# Patient Record
Sex: Male | Born: 1935 | Race: Black or African American | Hispanic: No | Marital: Married | Smoking: Never smoker
Health system: Southern US, Community
[De-identification: ages and names within clinical notes are randomized; demographics above are authoritative.]

## PROBLEM LIST (undated history)

## (undated) DIAGNOSIS — Z8719 Personal history of other diseases of the digestive system: Secondary | ICD-10-CM

## (undated) DIAGNOSIS — I10 Essential (primary) hypertension: Secondary | ICD-10-CM

## (undated) DIAGNOSIS — K279 Peptic ulcer, site unspecified, unspecified as acute or chronic, without hemorrhage or perforation: Secondary | ICD-10-CM

## (undated) DIAGNOSIS — I059 Rheumatic mitral valve disease, unspecified: Secondary | ICD-10-CM

## (undated) DIAGNOSIS — M1990 Unspecified osteoarthritis, unspecified site: Secondary | ICD-10-CM

## (undated) DIAGNOSIS — Z87898 Personal history of other specified conditions: Secondary | ICD-10-CM

## (undated) DIAGNOSIS — K449 Diaphragmatic hernia without obstruction or gangrene: Secondary | ICD-10-CM

## (undated) DIAGNOSIS — E785 Hyperlipidemia, unspecified: Secondary | ICD-10-CM

## (undated) DIAGNOSIS — K222 Esophageal obstruction: Secondary | ICD-10-CM

## (undated) DIAGNOSIS — F0390 Unspecified dementia without behavioral disturbance: Secondary | ICD-10-CM

## (undated) DIAGNOSIS — I080 Rheumatic disorders of both mitral and aortic valves: Secondary | ICD-10-CM

## (undated) HISTORY — DX: Unspecified osteoarthritis, unspecified site: M19.90

## (undated) HISTORY — DX: Diaphragmatic hernia without obstruction or gangrene: K44.9

## (undated) HISTORY — PX: HEMORRHOID SURGERY: SHX153

## (undated) HISTORY — DX: Rheumatic mitral valve disease, unspecified: I05.9

## (undated) HISTORY — DX: Rheumatic disorders of both mitral and aortic valves: I08.0

## (undated) HISTORY — DX: Essential (primary) hypertension: I10

## (undated) HISTORY — DX: Hyperlipidemia, unspecified: E78.5

## (undated) HISTORY — DX: Peptic ulcer, site unspecified, unspecified as acute or chronic, without hemorrhage or perforation: K27.9

## (undated) HISTORY — PX: CIRCUMCISION: SUR203

## (undated) HISTORY — DX: Personal history of other specified conditions: Z87.898

## (undated) HISTORY — DX: Personal history of other diseases of the digestive system: Z87.19

## (undated) HISTORY — DX: Esophageal obstruction: K22.2

---

## 1989-11-09 ENCOUNTER — Encounter: Payer: Self-pay | Admitting: Gastroenterology

## 2001-06-22 ENCOUNTER — Encounter: Admission: RE | Admit: 2001-06-22 | Discharge: 2001-06-22 | Payer: Self-pay | Admitting: Urology

## 2001-06-22 ENCOUNTER — Encounter: Payer: Self-pay | Admitting: Urology

## 2001-06-23 ENCOUNTER — Encounter (INDEPENDENT_AMBULATORY_CARE_PROVIDER_SITE_OTHER): Payer: Self-pay | Admitting: Specialist

## 2001-06-23 ENCOUNTER — Ambulatory Visit (HOSPITAL_BASED_OUTPATIENT_CLINIC_OR_DEPARTMENT_OTHER): Admission: RE | Admit: 2001-06-23 | Discharge: 2001-06-23 | Payer: Self-pay | Admitting: Urology

## 2004-05-20 ENCOUNTER — Ambulatory Visit (HOSPITAL_COMMUNITY): Admission: RE | Admit: 2004-05-20 | Discharge: 2004-05-20 | Payer: Self-pay | Admitting: Urology

## 2004-05-28 ENCOUNTER — Ambulatory Visit: Payer: Self-pay | Admitting: *Deleted

## 2004-05-29 ENCOUNTER — Ambulatory Visit: Payer: Self-pay | Admitting: *Deleted

## 2004-06-09 ENCOUNTER — Ambulatory Visit: Admission: RE | Admit: 2004-06-09 | Discharge: 2004-09-07 | Payer: Self-pay | Admitting: Radiation Oncology

## 2004-06-11 ENCOUNTER — Ambulatory Visit: Payer: Self-pay

## 2004-10-15 ENCOUNTER — Ambulatory Visit: Admission: RE | Admit: 2004-10-15 | Discharge: 2004-10-15 | Payer: Self-pay | Admitting: Radiation Oncology

## 2005-03-17 ENCOUNTER — Ambulatory Visit: Payer: Self-pay | Admitting: *Deleted

## 2005-03-31 ENCOUNTER — Ambulatory Visit: Payer: Self-pay

## 2005-09-02 ENCOUNTER — Ambulatory Visit: Payer: Self-pay | Admitting: *Deleted

## 2005-09-07 ENCOUNTER — Ambulatory Visit: Payer: Self-pay | Admitting: *Deleted

## 2005-10-01 ENCOUNTER — Ambulatory Visit: Payer: Self-pay

## 2005-10-01 ENCOUNTER — Encounter: Payer: Self-pay | Admitting: Cardiology

## 2005-11-04 ENCOUNTER — Ambulatory Visit: Payer: Self-pay | Admitting: *Deleted

## 2005-12-02 ENCOUNTER — Ambulatory Visit: Payer: Self-pay | Admitting: Cardiology

## 2005-12-10 ENCOUNTER — Ambulatory Visit: Payer: Self-pay

## 2005-12-10 ENCOUNTER — Ambulatory Visit: Payer: Self-pay | Admitting: *Deleted

## 2005-12-29 ENCOUNTER — Ambulatory Visit: Payer: Self-pay

## 2005-12-29 ENCOUNTER — Encounter: Payer: Self-pay | Admitting: Cardiology

## 2006-02-02 ENCOUNTER — Ambulatory Visit: Payer: Self-pay | Admitting: *Deleted

## 2006-06-15 ENCOUNTER — Ambulatory Visit: Payer: Self-pay | Admitting: *Deleted

## 2006-06-18 ENCOUNTER — Encounter: Payer: Self-pay | Admitting: Cardiology

## 2006-06-18 ENCOUNTER — Ambulatory Visit: Payer: Self-pay

## 2006-12-30 ENCOUNTER — Ambulatory Visit: Payer: Self-pay | Admitting: *Deleted

## 2007-01-13 ENCOUNTER — Encounter (INDEPENDENT_AMBULATORY_CARE_PROVIDER_SITE_OTHER): Payer: Self-pay | Admitting: *Deleted

## 2007-01-13 ENCOUNTER — Ambulatory Visit: Payer: Self-pay

## 2007-01-13 ENCOUNTER — Ambulatory Visit: Payer: Self-pay | Admitting: Cardiology

## 2007-03-23 ENCOUNTER — Ambulatory Visit: Payer: Self-pay | Admitting: Cardiovascular Disease

## 2007-04-11 ENCOUNTER — Inpatient Hospital Stay (HOSPITAL_COMMUNITY): Admission: EM | Admit: 2007-04-11 | Discharge: 2007-04-13 | Payer: Self-pay | Admitting: *Deleted

## 2007-04-12 ENCOUNTER — Encounter: Payer: Self-pay | Admitting: Internal Medicine

## 2007-04-12 DIAGNOSIS — K279 Peptic ulcer, site unspecified, unspecified as acute or chronic, without hemorrhage or perforation: Secondary | ICD-10-CM | POA: Insufficient documentation

## 2007-04-12 DIAGNOSIS — K449 Diaphragmatic hernia without obstruction or gangrene: Secondary | ICD-10-CM | POA: Insufficient documentation

## 2007-04-13 ENCOUNTER — Ambulatory Visit: Payer: Self-pay | Admitting: Internal Medicine

## 2007-05-11 ENCOUNTER — Ambulatory Visit: Payer: Self-pay | Admitting: Internal Medicine

## 2007-05-11 LAB — CONVERTED CEMR LAB
Basophils Absolute: 0 10*3/uL (ref 0.0–0.1)
Eosinophils Relative: 2.8 % (ref 0.0–5.0)
Hemoglobin: 10.5 g/dL — ABNORMAL LOW (ref 13.0–17.0)
MCV: 86.9 fL (ref 78.0–100.0)
Monocytes Absolute: 0.4 10*3/uL (ref 0.2–0.7)
Platelets: 206 10*3/uL (ref 150–400)
RBC: 3.59 M/uL — ABNORMAL LOW (ref 4.22–5.81)
WBC: 6.6 10*3/uL (ref 4.5–10.5)

## 2007-06-15 ENCOUNTER — Encounter: Payer: Self-pay | Admitting: Internal Medicine

## 2007-06-21 ENCOUNTER — Encounter: Payer: Self-pay | Admitting: Internal Medicine

## 2007-06-21 DIAGNOSIS — Z87898 Personal history of other specified conditions: Secondary | ICD-10-CM

## 2007-06-21 DIAGNOSIS — E785 Hyperlipidemia, unspecified: Secondary | ICD-10-CM | POA: Insufficient documentation

## 2007-06-21 DIAGNOSIS — I059 Rheumatic mitral valve disease, unspecified: Secondary | ICD-10-CM | POA: Insufficient documentation

## 2007-06-21 DIAGNOSIS — I10 Essential (primary) hypertension: Secondary | ICD-10-CM | POA: Insufficient documentation

## 2007-06-21 DIAGNOSIS — Z8719 Personal history of other diseases of the digestive system: Secondary | ICD-10-CM | POA: Insufficient documentation

## 2007-06-23 ENCOUNTER — Ambulatory Visit: Payer: Self-pay | Admitting: Cardiovascular Disease

## 2007-06-23 LAB — CONVERTED CEMR LAB
Total Bilirubin: 1.5 mg/dL — ABNORMAL HIGH (ref 0.3–1.2)
Total Protein: 6.5 g/dL (ref 6.0–8.3)
VLDL: 9 mg/dL (ref 0–40)

## 2007-09-16 DIAGNOSIS — K222 Esophageal obstruction: Secondary | ICD-10-CM | POA: Insufficient documentation

## 2007-09-30 ENCOUNTER — Ambulatory Visit: Payer: Self-pay

## 2007-09-30 LAB — CONVERTED CEMR LAB
BUN: 14 mg/dL (ref 6–23)
Creatinine, Ser: 1.2 mg/dL (ref 0.4–1.5)
Sodium: 146 meq/L — ABNORMAL HIGH (ref 135–145)

## 2007-10-07 ENCOUNTER — Ambulatory Visit: Payer: Self-pay | Admitting: Cardiovascular Disease

## 2007-10-10 ENCOUNTER — Emergency Department (HOSPITAL_COMMUNITY): Admission: EM | Admit: 2007-10-10 | Discharge: 2007-10-10 | Payer: Self-pay | Admitting: Emergency Medicine

## 2008-07-31 ENCOUNTER — Ambulatory Visit: Payer: Self-pay | Admitting: Cardiovascular Disease

## 2008-07-31 ENCOUNTER — Ambulatory Visit: Payer: Self-pay

## 2008-07-31 ENCOUNTER — Encounter: Payer: Self-pay | Admitting: Cardiovascular Disease

## 2008-08-01 ENCOUNTER — Ambulatory Visit: Payer: Self-pay | Admitting: Cardiovascular Disease

## 2008-08-01 LAB — CONVERTED CEMR LAB
ALT: 18 units/L (ref 0–53)
AST: 21 units/L (ref 0–37)
BUN: 16 mg/dL (ref 6–23)
Creatinine, Ser: 1 mg/dL (ref 0.4–1.5)
GFR calc Af Amer: 93 mL/min
GFR calc non Af Amer: 77 mL/min
HDL: 56.2 mg/dL (ref >39.0)
LDL Cholesterol: 86 mg/dL (ref 0–99)
Potassium: 4.1 meq/L (ref 3.5–5.1)
Sodium: 143 meq/L (ref 135–145)
Total Bilirubin: 1.4 mg/dL — ABNORMAL HIGH (ref 0.3–1.2)
Total Protein: 6.6 g/dL (ref 6.0–8.3)
Triglycerides: 75 mg/dL (ref 0–149)
VLDL: 15 mg/dL (ref 0–40)

## 2008-12-25 ENCOUNTER — Emergency Department (HOSPITAL_COMMUNITY): Admission: EM | Admit: 2008-12-25 | Discharge: 2008-12-25 | Payer: Self-pay | Admitting: Emergency Medicine

## 2009-01-17 ENCOUNTER — Telehealth: Payer: Self-pay | Admitting: Internal Medicine

## 2009-01-29 DIAGNOSIS — M1990 Unspecified osteoarthritis, unspecified site: Secondary | ICD-10-CM | POA: Insufficient documentation

## 2009-01-29 DIAGNOSIS — I080 Rheumatic disorders of both mitral and aortic valves: Secondary | ICD-10-CM | POA: Insufficient documentation

## 2009-02-04 ENCOUNTER — Ambulatory Visit: Payer: Self-pay | Admitting: Cardiovascular Disease

## 2009-04-01 ENCOUNTER — Ambulatory Visit: Payer: Self-pay | Admitting: Cardiology

## 2009-04-01 ENCOUNTER — Inpatient Hospital Stay (HOSPITAL_COMMUNITY): Admission: EM | Admit: 2009-04-01 | Discharge: 2009-04-05 | Payer: Self-pay | Admitting: Emergency Medicine

## 2009-04-02 ENCOUNTER — Encounter: Payer: Self-pay | Admitting: Cardiovascular Disease

## 2009-04-03 ENCOUNTER — Encounter: Payer: Self-pay | Admitting: Cardiology

## 2009-04-08 ENCOUNTER — Encounter (INDEPENDENT_AMBULATORY_CARE_PROVIDER_SITE_OTHER): Payer: Self-pay

## 2009-04-08 ENCOUNTER — Ambulatory Visit: Payer: Self-pay | Admitting: Cardiovascular Disease

## 2009-04-08 LAB — CONVERTED CEMR LAB: Prothrombin Time: 11.7 s (ref 9.1–11.7)

## 2009-04-09 ENCOUNTER — Inpatient Hospital Stay (HOSPITAL_BASED_OUTPATIENT_CLINIC_OR_DEPARTMENT_OTHER): Admission: RE | Admit: 2009-04-09 | Discharge: 2009-04-09 | Payer: Self-pay | Admitting: Cardiovascular Disease

## 2009-04-09 ENCOUNTER — Ambulatory Visit: Payer: Self-pay | Admitting: Cardiovascular Disease

## 2009-04-19 ENCOUNTER — Ambulatory Visit: Payer: Self-pay | Admitting: Thoracic Surgery (Cardiothoracic Vascular Surgery)

## 2009-04-19 ENCOUNTER — Encounter: Payer: Self-pay | Admitting: Internal Medicine

## 2009-04-23 ENCOUNTER — Encounter
Admission: RE | Admit: 2009-04-23 | Discharge: 2009-04-23 | Payer: Self-pay | Admitting: Thoracic Surgery (Cardiothoracic Vascular Surgery)

## 2009-05-03 ENCOUNTER — Encounter: Payer: Self-pay | Admitting: Thoracic Surgery (Cardiothoracic Vascular Surgery)

## 2009-05-03 ENCOUNTER — Ambulatory Visit (HOSPITAL_COMMUNITY)
Admission: RE | Admit: 2009-05-03 | Discharge: 2009-05-03 | Payer: Self-pay | Admitting: Thoracic Surgery (Cardiothoracic Vascular Surgery)

## 2009-05-06 ENCOUNTER — Ambulatory Visit: Payer: Self-pay | Admitting: Thoracic Surgery (Cardiothoracic Vascular Surgery)

## 2009-05-07 ENCOUNTER — Ambulatory Visit: Payer: Self-pay | Admitting: Thoracic Surgery (Cardiothoracic Vascular Surgery)

## 2009-05-07 ENCOUNTER — Inpatient Hospital Stay (HOSPITAL_COMMUNITY)
Admission: RE | Admit: 2009-05-07 | Discharge: 2009-05-14 | Payer: Self-pay | Admitting: Thoracic Surgery (Cardiothoracic Vascular Surgery)

## 2009-05-07 HISTORY — PX: MITRAL VALVE REPAIR: SHX2039

## 2009-06-03 ENCOUNTER — Encounter: Payer: Self-pay | Admitting: Cardiovascular Disease

## 2009-06-03 ENCOUNTER — Ambulatory Visit: Payer: Self-pay | Admitting: Thoracic Surgery (Cardiothoracic Vascular Surgery)

## 2009-06-03 ENCOUNTER — Encounter
Admission: RE | Admit: 2009-06-03 | Discharge: 2009-06-03 | Payer: Self-pay | Admitting: Thoracic Surgery (Cardiothoracic Vascular Surgery)

## 2009-06-11 ENCOUNTER — Encounter: Payer: Self-pay | Admitting: Cardiovascular Disease

## 2009-06-18 ENCOUNTER — Encounter: Payer: Self-pay | Admitting: Cardiovascular Disease

## 2009-06-26 ENCOUNTER — Encounter (INDEPENDENT_AMBULATORY_CARE_PROVIDER_SITE_OTHER): Payer: Self-pay | Admitting: *Deleted

## 2009-07-01 ENCOUNTER — Ambulatory Visit: Payer: Self-pay | Admitting: Cardiovascular Disease

## 2009-07-29 ENCOUNTER — Encounter: Payer: Self-pay | Admitting: Cardiovascular Disease

## 2009-07-29 ENCOUNTER — Ambulatory Visit: Payer: Self-pay | Admitting: Thoracic Surgery (Cardiothoracic Vascular Surgery)

## 2009-08-13 ENCOUNTER — Encounter: Payer: Self-pay | Admitting: Cardiovascular Disease

## 2009-08-13 ENCOUNTER — Ambulatory Visit (HOSPITAL_COMMUNITY): Admission: RE | Admit: 2009-08-13 | Discharge: 2009-08-13 | Payer: Self-pay | Admitting: Cardiovascular Disease

## 2009-08-13 ENCOUNTER — Ambulatory Visit: Payer: Self-pay

## 2009-08-13 ENCOUNTER — Ambulatory Visit: Payer: Self-pay | Admitting: Cardiovascular Disease

## 2009-09-09 ENCOUNTER — Telehealth (INDEPENDENT_AMBULATORY_CARE_PROVIDER_SITE_OTHER): Payer: Self-pay | Admitting: *Deleted

## 2009-10-18 ENCOUNTER — Ambulatory Visit: Payer: Self-pay | Admitting: Cardiovascular Disease

## 2009-11-04 ENCOUNTER — Ambulatory Visit: Payer: Self-pay | Admitting: Cardiovascular Disease

## 2009-11-06 LAB — CONVERTED CEMR LAB
CO2: 32 meq/L (ref 19–32)
Chloride: 107 meq/L (ref 96–112)
Cholesterol: 174 mg/dL (ref 0–200)
Creatinine, Ser: 1.1 mg/dL (ref 0.4–1.5)
GFR calc non Af Amer: 82.68 mL/min (ref >60)
Glucose, Bld: 89 mg/dL (ref 70–99)
HDL: 67.5 mg/dL (ref >39.00)
LDL Cholesterol: 83 mg/dL (ref 0–99)
Sodium: 145 meq/L (ref 135–145)
Total CHOL/HDL Ratio: 3
Total Protein: 6.6 g/dL (ref 6.0–8.3)

## 2010-01-14 IMAGING — CR DG CHEST 1V PORT
1 series · 1 of 1 positions shown · non-contrast
Comparison: 04/01/2009

CLINICAL DATA: CHF

PORTABLE CHEST - 1 VIEW

[view not recorded]
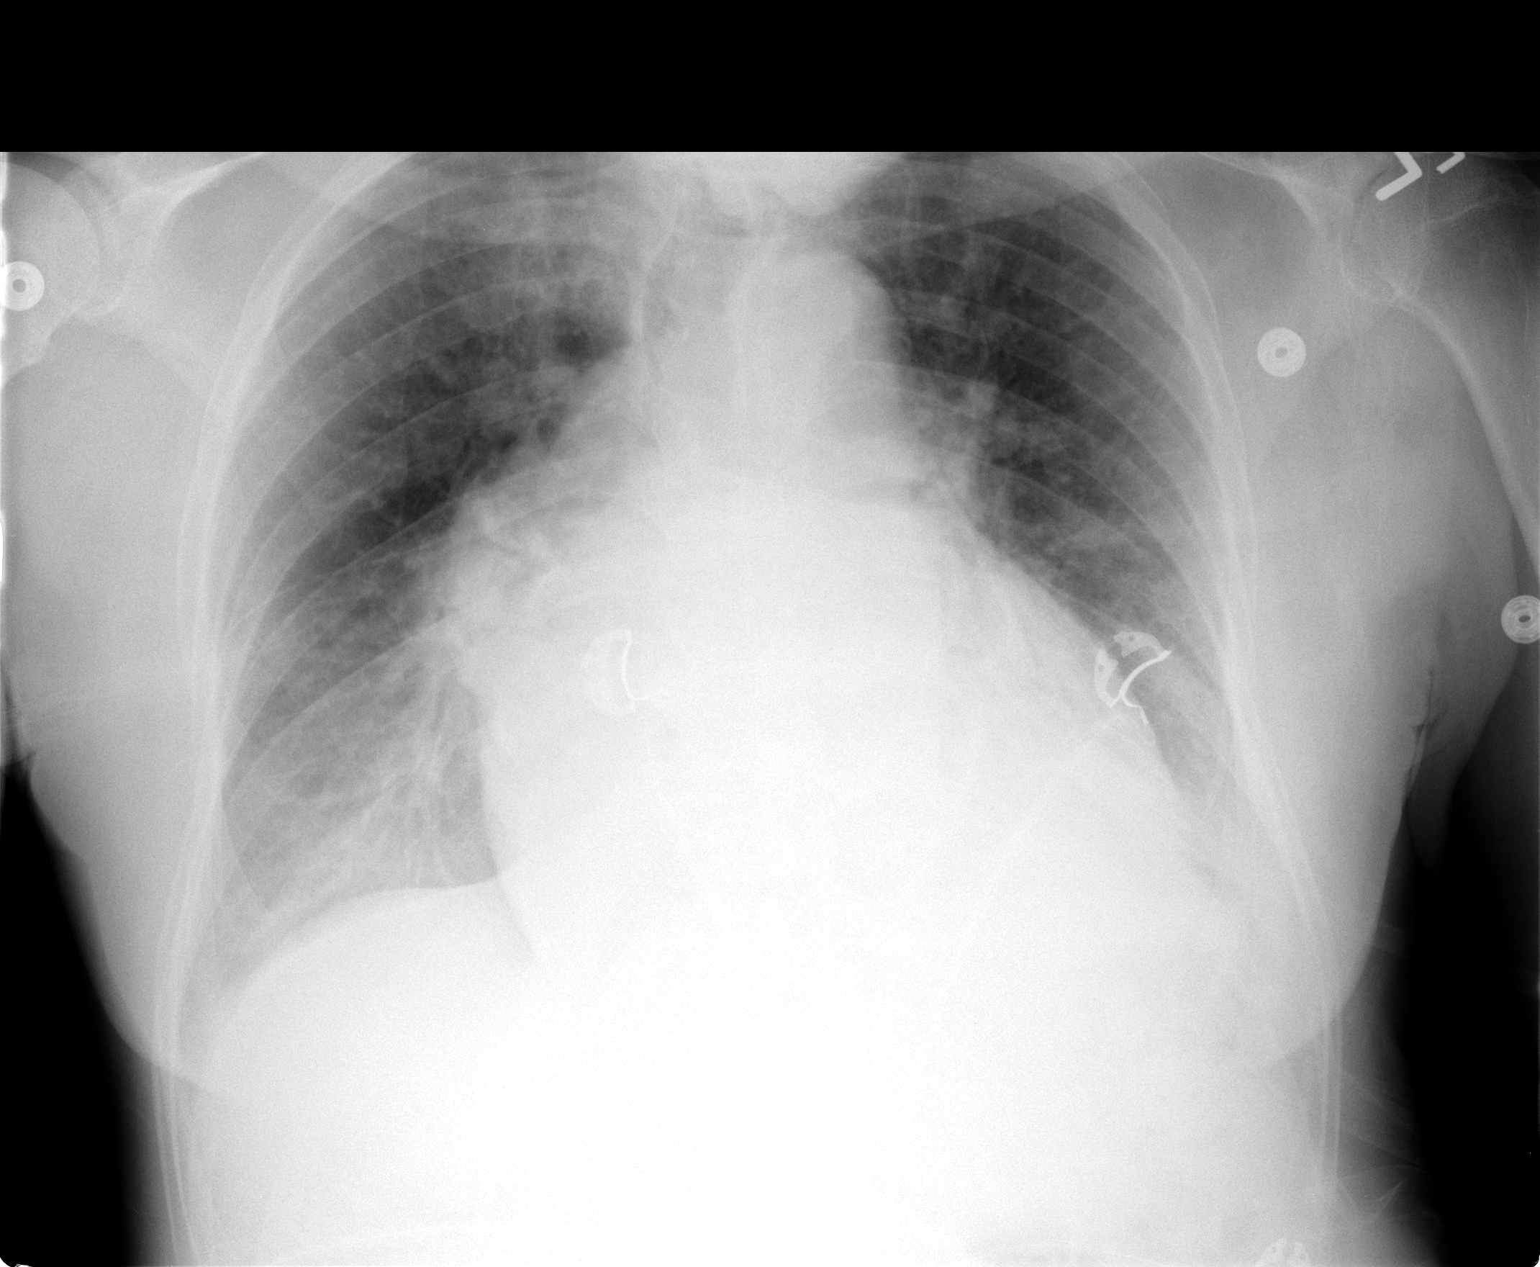

[1 of 1 positions shown; findings below may reference images not displayed]

FINDINGS: Slight increase in degree of pulmonary vascular
congestion.  Cardiomegaly.  Probable  minimal left pleural
effusion. Possibly minimal interstitial edema.
IMPRESSION: Increase in pulmonary vascular congestion.

## 2010-02-04 ENCOUNTER — Encounter: Payer: Self-pay | Admitting: Cardiovascular Disease

## 2010-02-18 IMAGING — CR DG CHEST 1V PORT
1 series · 1 of 1 positions shown · non-contrast
Comparison: 05/03/2009

CLINICAL DATA: Mitral regurgitation.  Postop.

PORTABLE CHEST - 1 VIEW

[AP]
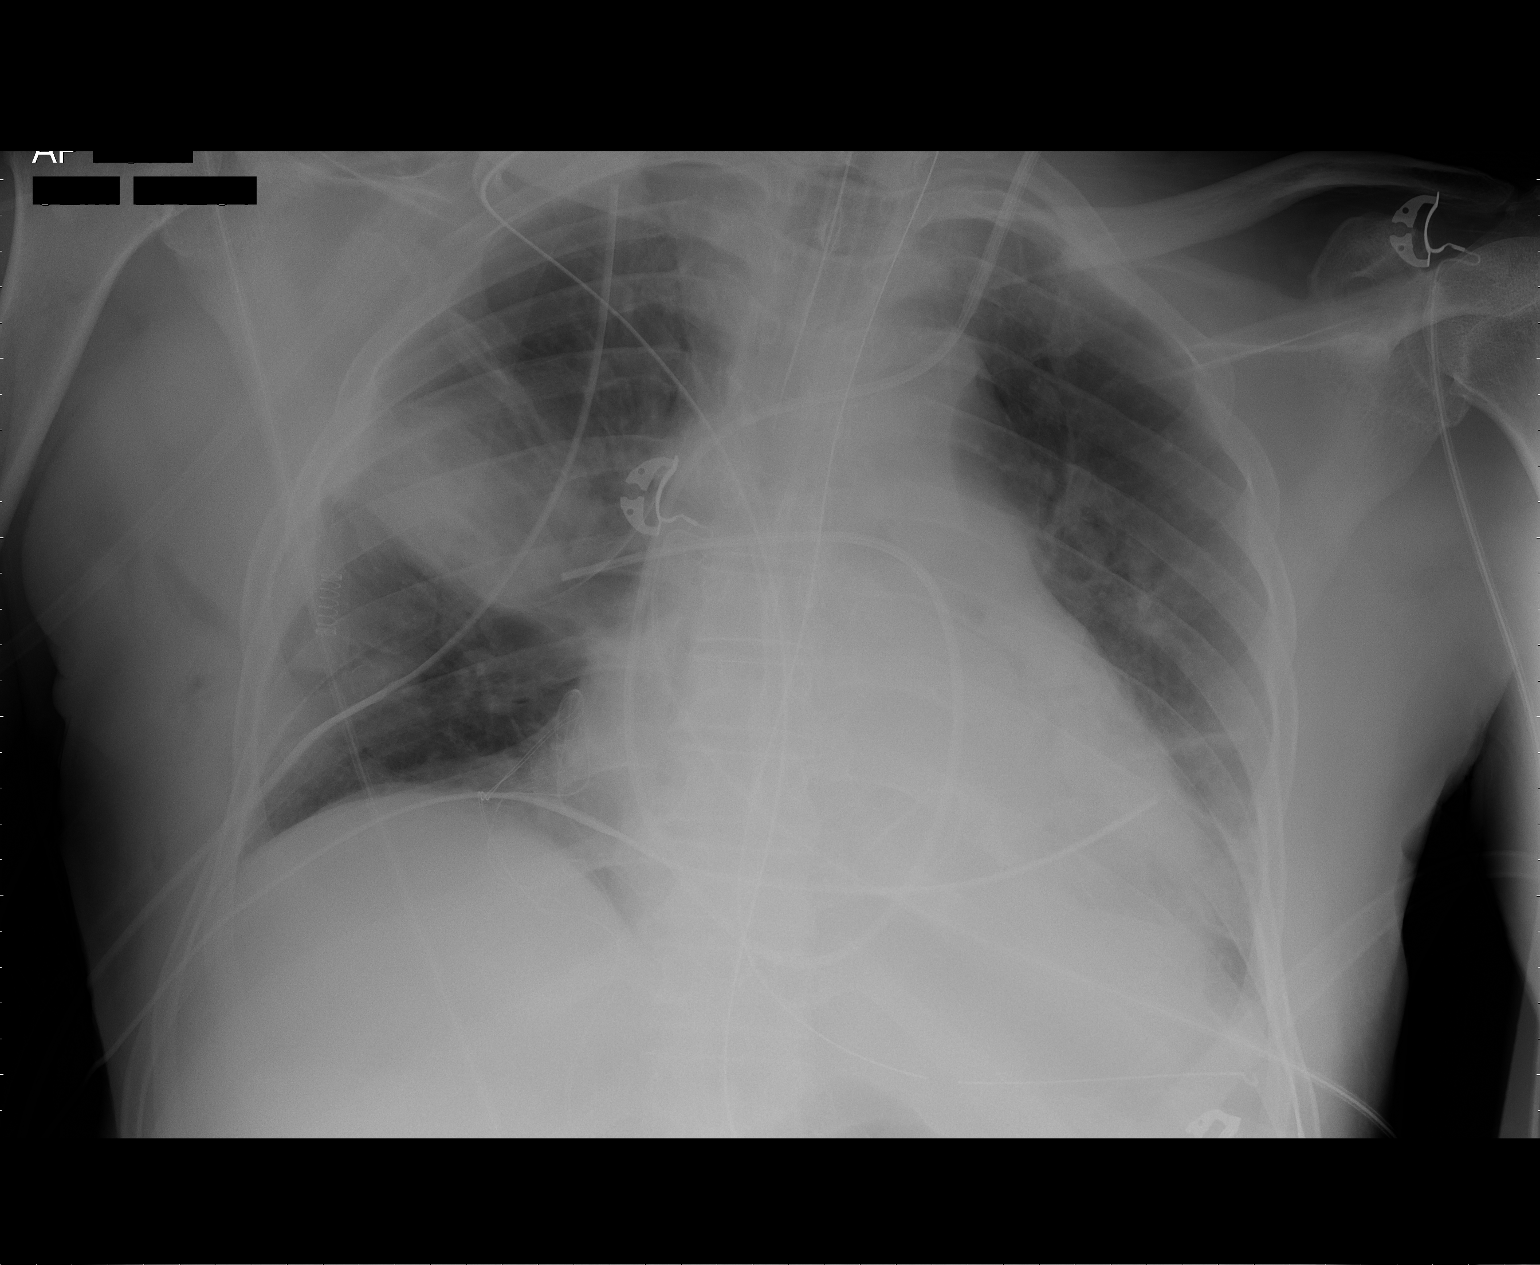

[1 of 1 positions shown; findings below may reference images not displayed]

FINDINGS: Intubation.  Endotracheal tube terminates 2.2 cm above
the carina.  Nasogastric tube terminates at the body of the
stomach.  A left IJ Swan-Ganz catheter terminates at the central
versus lobar branch of the right pulmonary artery. Interval mitral
valve repair. A right-sided  and a probable left-sided chest tube.
Cardiomegaly accentuated by AP portable technique.  No definite
pleural effusion. No pneumothorax.  Low lung volumes.  Patchy right
midlung and left lung base air space disease. Probable subcutaneous
air about the right chest, minimal.
IMPRESSION: 1.  Interval mitral valve repair with patchy bilateral atelectasis.
2. No pneumothorax.
3.  Left IJ Swan-Ganz catheter, with tip borderline peripheral in
position.  Consider retraction 2 cm.

## 2010-02-20 IMAGING — CR DG CHEST 1V PORT
1 series · 1 of 1 positions shown · non-contrast
Comparison: 05/08/2009

CLINICAL DATA: CABG.

PORTABLE CHEST - 1 VIEW

[AP]
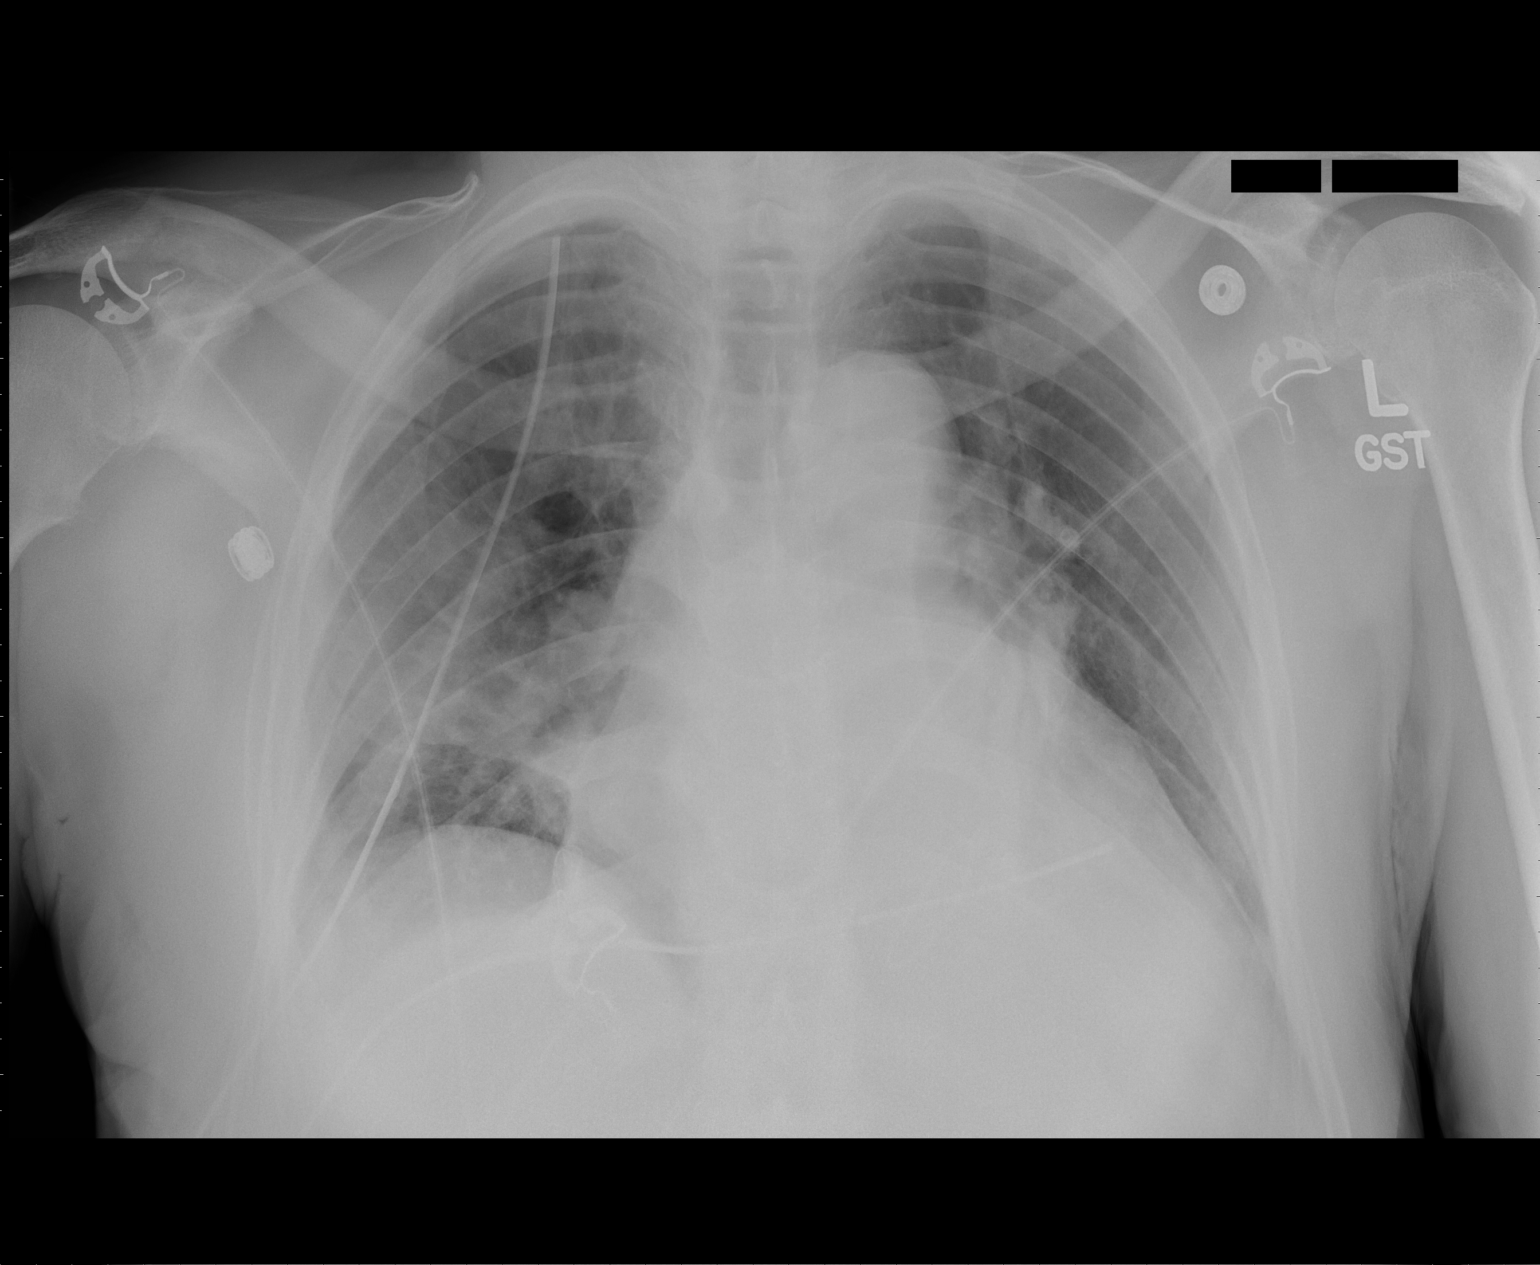

[1 of 1 positions shown; findings below may reference images not displayed]

FINDINGS: Interval removal of left IJ Swan-Ganz catheter.  2 chest
tubes are unchanged in position.

Moderate cardiomegaly.  Probable small left pleural effusion. No
pneumothorax.  No change in mild interstitial edema and patchy
bilateral atelectasis.
IMPRESSION: 1.  Removal of left IJ Swan-Ganz catheter.
2.  Otherwise, similar interstitial edema and patchy bilateral
atelectasis.

## 2010-02-21 IMAGING — CR DG CHEST 2V
2 series · 2 of 2 positions shown · non-contrast
Comparison: 05/09/2009

CLINICAL DATA: Chest tube removal.

CHEST - 2 VIEW

[w chest pa]
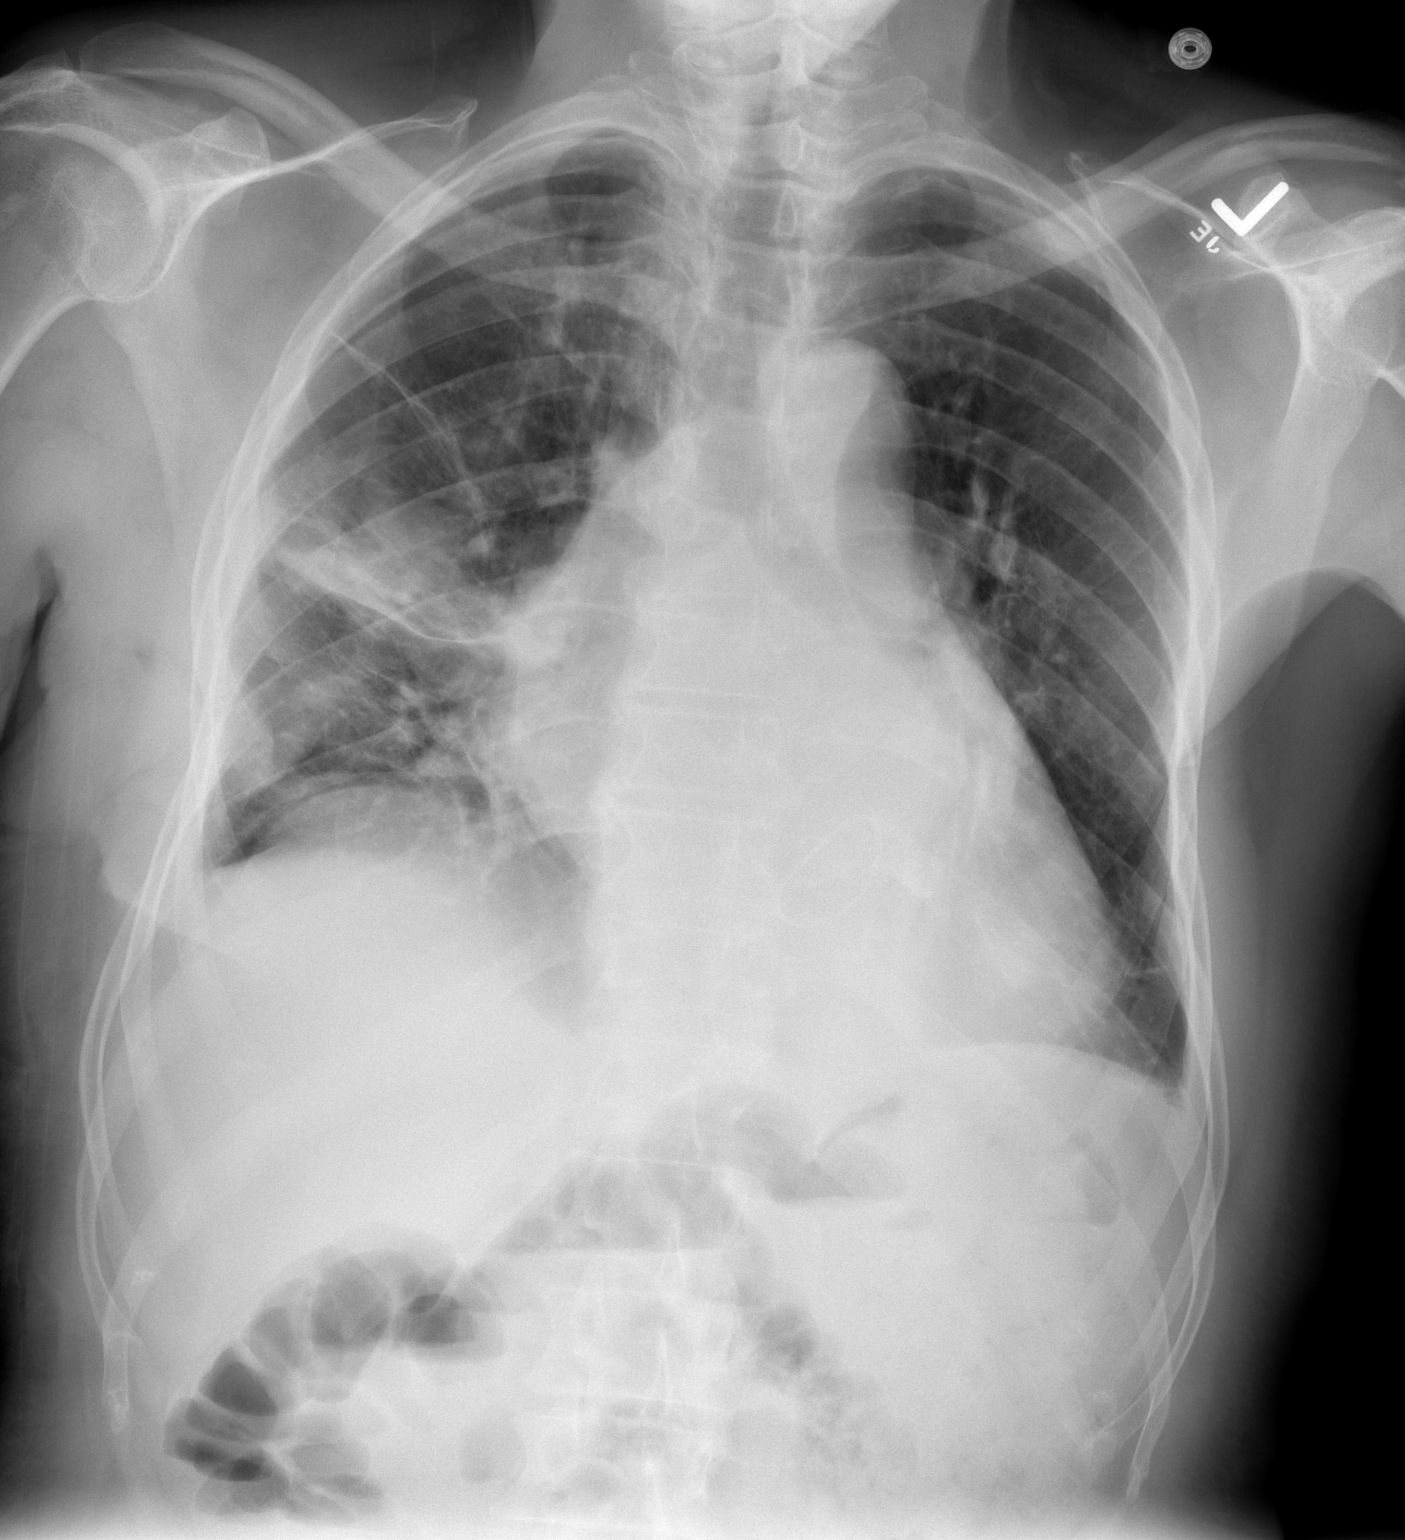

[w chest lat]
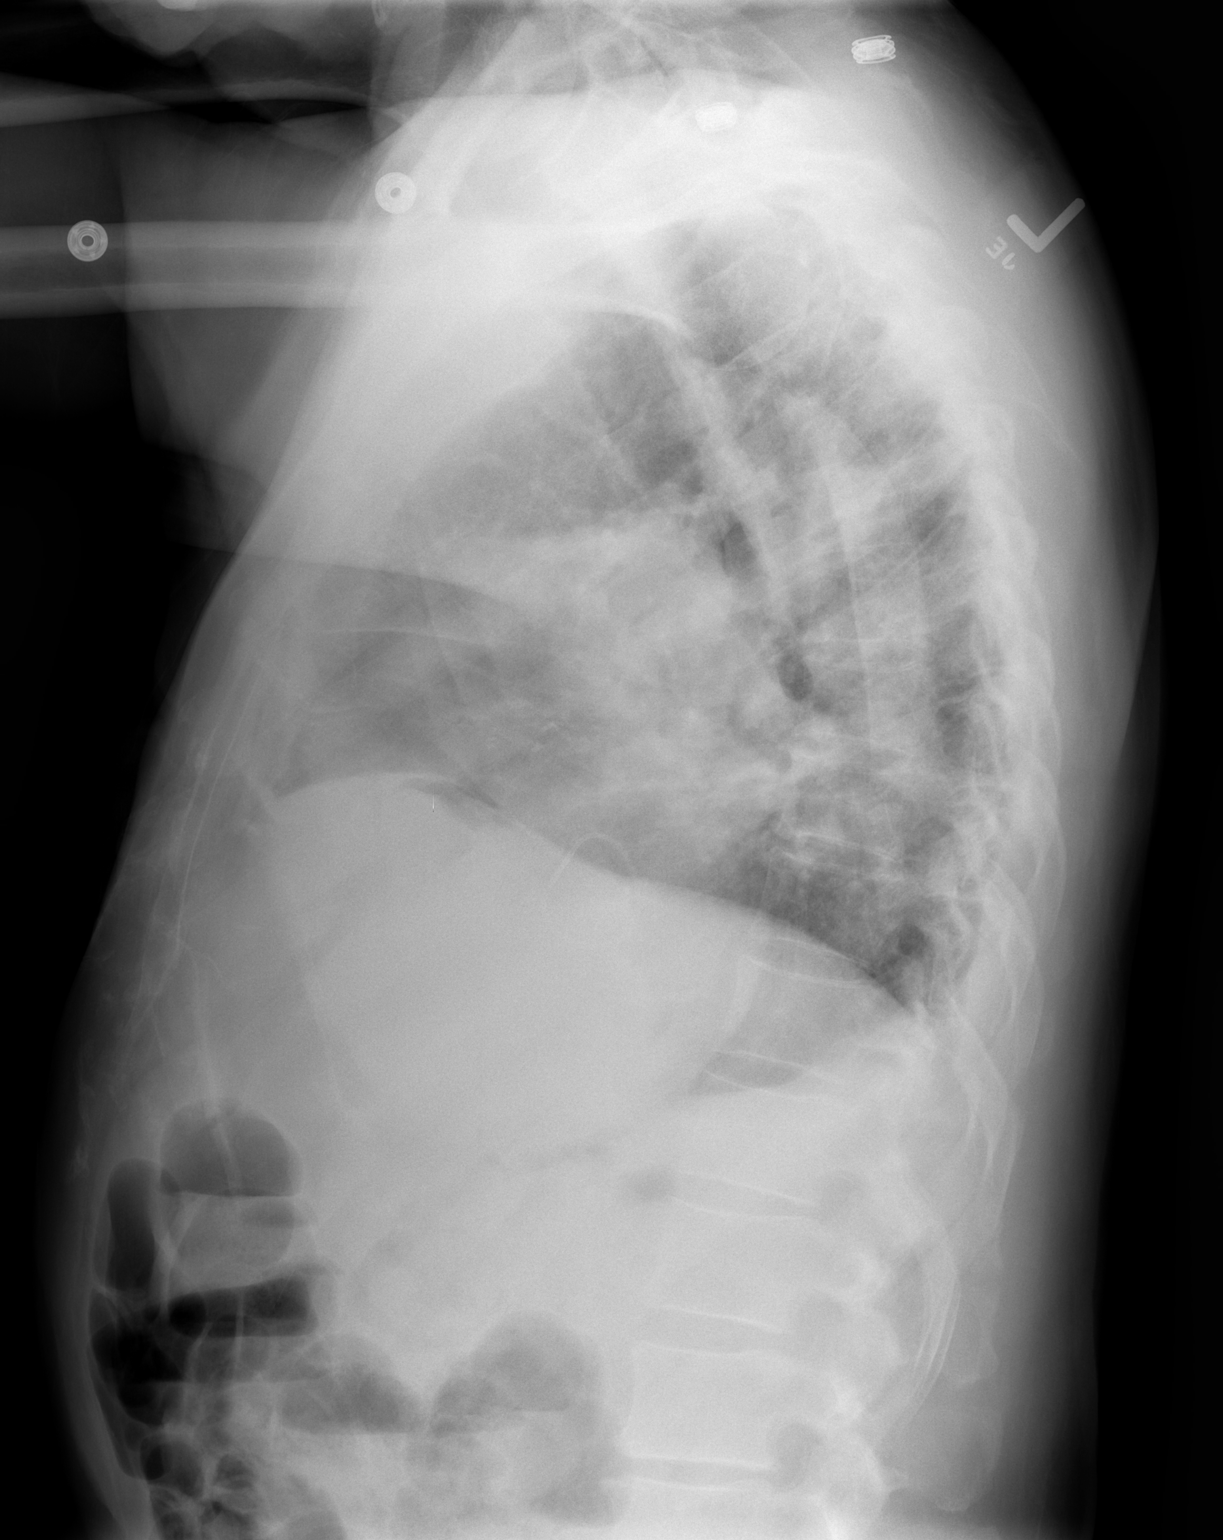

[2 of 2 positions shown; findings below may reference images not displayed]

FINDINGS: Right chest tube has been removed.  No pneumothorax.
Patchy right mid and lower lung airspace disease again noted,
unchanged.  Minimal left base atelectasis.  There is stable
cardiomegaly.  The patient is status post valve replacement.
IMPRESSION: Interval removal of right chest tube without pneumothorax.

Patchy right mid and lower lung airspace disease, stable.

Cardiomegaly.

## 2010-05-15 ENCOUNTER — Ambulatory Visit: Payer: Self-pay | Admitting: Cardiovascular Disease

## 2010-05-28 ENCOUNTER — Ambulatory Visit: Payer: Self-pay | Admitting: Cardiovascular Disease

## 2010-05-29 LAB — CONVERTED CEMR LAB
BUN: 25 mg/dL — ABNORMAL HIGH (ref 6–23)
Creatinine, Ser: 1.2 mg/dL (ref 0.4–1.5)
GFR calc non Af Amer: 78.43 mL/min (ref >60)
Sodium: 141 meq/L (ref 135–145)

## 2010-08-09 ENCOUNTER — Encounter: Payer: Self-pay | Admitting: Urology

## 2010-08-19 NOTE — Letter (Signed)
Summary: [facility] Office Visit  [facility] Office Visit   Imported By: Kassie Mends 08/07/2009 10:55:37  _____________________________________________________________________  External Attachment:    Type:   Image     Comment:   External Document

## 2010-08-19 NOTE — Letter (Signed)
[facility]: [facility]   Imported By: Marylou Mccoy 02/19/2010 17:56:57  _____________________________________________________________________  External Attachment:    Type:   Image     Comment:   External Document

## 2010-08-19 NOTE — Miscellaneous (Signed)
Summary: [facility]  [facility]   Imported By: Roderic Ovens 07/23/2009 16:11:24  _____________________________________________________________________  External Attachment:    Type:   Image     Comment:   External Document

## 2010-08-19 NOTE — Assessment & Plan Note (Signed)
Summary: f66m   Visit Type:  6 months follow up Primary Provider:  Jacques Navy MD  CC:  No complaints.  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 He presents today for followup evaluation.  The patient underwent mitral valve repair in October 2009 from a right mini thoracotomy approach. He has done very well since valve repair. Post op echo was done January 2011 and it demonstrated only trivial MR. LA pressure appeared elevated based on bowing of the interatrial septum.  Mr Dorwart continues to feel well. He denies chest pain, dyspnea, orthopnea, or PND. He has no complaints. He exercises regularly at the [facility] on the treadmill and bicycle.     Current Medications (verified): 1)  Lipitor 80 Mg  Tabs (Atorvastatin Calcium) .Marland Kitchen.. 1 By Mouth Once Daily 2)  Metoprolol Tartrate 25 Mg Tabs (Metoprolol Tartrate) .... Take One Tablet By Mouth Twice A Day 3)  Timoptic 0.5 % Soln (Timolol Maleate) .... As Directed 4)  Amlodipine Besylate 5 Mg Tabs (Amlodipine Besylate) .... Take One Tablet By Mouth Daily 5)  Lisinopril 20 Mg Tabs (Lisinopril) .... Take One Tablet By Mouth Daily 6)  Multivitamins  Tabs (Multiple Vitamin) .... Take 1 Tablet By Mouth Once A Day  Allergies: 1)  ! Jonne Ply  Past History:  Past medical history reviewed for relevance to current acute and chronic problems.  Past Medical History: Reviewed history from 10/18/2009 and no changes required. Current Problems:  MITRAL REGURGITATION (ICD-396.3) s/p MV repair 2009 MITRAL VALVE PROLAPSE (ICD-424.0) HYPERLIPIDEMIA (ICD-272.4) HYPERTENSION (ICD-401.9) PUD (ICD-533.90) ESOPHAGEAL STRICTURE (ICD-530.3) HIATAL HERNIA (ICD-553.3) BENIGN PROSTATIC HYPERTROPHY, HX OF (ICD-V13.8) GASTROINTESTINAL HEMORRHAGE, HX OF (ICD-V12.79) OSTEOARTHRITIS (ICD-715.90)  Review of Systems       Negative except as per  HPI   Vital Signs:  Patient profile:   77 year old male Height:      68 inches Weight:      172 pounds BMI:     26.25 Pulse rate:   55 / minute Pulse rhythm:   irregular Resp:     18 per minute BP sitting:   155 / 73  (left arm) Cuff size:   large  Vitals Entered By: Vikki Ports (May 15, 2010 10:14 AM)  Physical Exam  General:  Pt is alert and oriented, in no acute distress, elderly male. HEENT: normal Neck: normal carotid upstrokes without bruits, JVP normal Lungs: CTA CV: RRR without murmur or gallop Abd: soft, NT, positive BS, no bruit, no organomegaly Ext: no clubbing, cyanosis, or edema. peripheral pulses 2+ and equal Skin: warm and dry without rash    EKG  Procedure date:  05/15/2010  Findings:      Sinus bradycardia with first degree AVB 55 bpm, LVH, T wave abnormality nonspecific  Impression & Recommendations:  Problem # 1:  MITRAL REGURGITATION (ICD-396.3) Pt stable with a normal exam following MV repair.   Problem # 2:  HYPERTENSION (ICD-401.9) Suboptimal control. Recommend add HCTZ 25 mg and followup metabolic panel in 2 -3 weeks.  The following medications were removed from the medication list:    Lisinopril 20 Mg Tabs (Lisinopril) .Marland Kitchen... Take one tablet by mouth daily His updated medication list for this problem includes:    Metoprolol Tartrate 25 Mg Tabs (Metoprolol tartrate) .Marland Kitchen... Take one tablet by mouth twice a day    Amlodipine Besylate 5 Mg Tabs (Amlodipine besylate) .Marland Kitchen... Take one tablet by mouth daily  Lisinopril-hydrochlorothiazide 20-25 Mg Tabs (Lisinopril-hydrochlorothiazide) .Marland Kitchen... Please take one tablet by mouth daily.  BP today: 155/73 Prior BP: 154/82 (10/18/2009)  Labs Reviewed: K+: 3.9 (11/04/2009) Creat: : 1.1 (11/04/2009)   Chol: 174 (11/04/2009)   HDL: 67.50 (11/04/2009)   LDL: 83 (11/04/2009)   TG: 117.0 (11/04/2009)  Problem # 3:  HYPERLIPIDEMIA (ICD-272.4) Lipids at goal.  His updated medication list for this  problem includes:    Lipitor 80 Mg Tabs (Atorvastatin calcium) .Marland Kitchen... 1 by mouth once daily  CHOL: 174 (11/04/2009)   LDL: 83 (11/04/2009)   HDL: 67.50 (11/04/2009)   TG: 117.0 (11/04/2009)  Other Orders: EKG w/ Interpretation (93000)  Patient Instructions: 1)  Your physician recommends that you schedule a follow-up appointment in: 6 months 2)  Your physician has recommended you make the following change in your medication: START lisinopril-hctz 20-25mg  by mouth daily and STOP taking your LISINOPRIL. 3)  Your physician recommends that you return for lab work in 2-3 weeks for lab work.  Prescriptions: LISINOPRIL-HYDROCHLOROTHIAZIDE 20-25 MG TABS (LISINOPRIL-HYDROCHLOROTHIAZIDE) please take one tablet by mouth daily.  #30 x 8   Entered by:   Whitney Maeola Sarah RN   Authorized by:   Norva Karvonen, MD   Signed by:   Ellender Hose RN on 05/15/2010   Method used:   [facility] [phone][facility] (retail[facility]       [address]       [place], [place]  [address]       Ph: [phone] or [phone]       Fax: [phone]   RxID:   [id number]

## 2010-08-19 NOTE — Assessment & Plan Note (Signed)
Summary: f60m   Visit Type:  4 months follow up Primary Provider:  Jacques Navy MD  CC:  No complaints.  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 He presents today for followup evaluation.  The patient underwent mitral valve repair in October 2009 from a right mini thoracotomy approach. He has done very well since valve repair. Post op echo was done January 2011 and it demonstrated only trivial MR. LA pressure appeared elevated based on bowing of the interatrial septum.  The pt feels well. The patient denies chest pain, dyspnea, orthopnea, PND, edema, palpitations, lightheadedness, or syncope.  The pt ran out of Lisinopril, Norvasc, and Lasix and has been off of them for about 2 months.     Current Medications (verified): 1)  Lipitor 80 Mg  Tabs (Atorvastatin Calcium) .Marland Kitchen.. 1 By Mouth Once Daily 2)  Metoprolol Tartrate 25 Mg Tabs (Metoprolol Tartrate) .... Take One Tablet By Mouth Twice A Day 3)  Timoptic 0.5 % Soln (Timolol Maleate) .... As Directed  Allergies: 1)  ! Jonne Ply  Past History:  Past medical history reviewed for relevance to current acute and chronic problems.  Past Medical History: Current Problems:  MITRAL REGURGITATION (ICD-396.3) s/p MV repair 2009 MITRAL VALVE PROLAPSE (ICD-424.0) HYPERLIPIDEMIA (ICD-272.4) HYPERTENSION (ICD-401.9) PUD (ICD-533.90) ESOPHAGEAL STRICTURE (ICD-530.3) HIATAL HERNIA (ICD-553.3) BENIGN PROSTATIC HYPERTROPHY, HX OF (ICD-V13.8) GASTROINTESTINAL HEMORRHAGE, HX OF (ICD-V12.79) OSTEOARTHRITIS (ICD-715.90)  Past Surgical History: Mitral Valve repair 2009 Hemorrhoidectomy  Circumcision.  Review of Systems       Negative except as per HPI   Vital Signs:  Patient profile:   77 year old male Height:      68 inches Weight:      165 pounds BMI:     25.18 Pulse rate:   60 / minute Pulse rhythm:   regular Resp:      18 per minute BP sitting:   154 / 82  (left arm) Cuff size:   large  Vitals Entered By: Vikki Ports (October 18, 2009 2:15 PM) CC: No complaints Comments Patient ran out of Lisinopril 40mg , Furosemide 20mg  and norvas 10 mg about 2 months ago.  He did not refilled meds   Physical Exam  General:  Pt is alert and oriented, elderly, African-American male, in no acute distress. HEENT: normal Neck: normal carotid upstrokes without bruits, JVP normal Lungs: CTA CV: RRR without murmur or gallop Abd: soft, NT, positive BS, no bruit, no organomegaly Ext: no clubbing, cyanosis, or edema. peripheral pulses 2+ and equal Skin: warm and dry without rash    Echocardiogram  Procedure date:  08/13/2009  Findings:      Study Conclusions            - Left ventricle: The cavity size was normal. There was moderate       focal basal and mild concentric hypertrophy. Systolic function was       mildly reduced. The estimated ejection fraction was in the range       of 45% to 50%. Diffuse hypokinesis. Doppler parameters are       consistent with abnormal left ventricular relaxation (grade 1       diastolic dysfunction).     - Aortic valve: Mild regurgitation.     - Mitral valve: S/P ring with repair and trivial residual MR Prior       procedures included surgical repair. A 34mm Sorin-Puig-Messana  ring prosthesis was present and functioning normally. The sewing       ring appeared normal. Valve area by pressure half-time: 1.43cm 2.       Valve area by continuity equation (using LVOT flow): 1.39cm 2.     - Left atrium: The atrium was mildly dilated.     - Atrial septum: The septum bowed from left to right, consistent       with increased left atrial pressure. No defect or patent foramen       ovale was identified.  Impression & Recommendations:  Problem # 1:  MITRAL REGURGITATION (ICD-396.3) Pt s/p MV repair and continues to do well clinically. Echo reviewed from Jan 2011 and the MV ring  looks good wiht no significant MR. Continue current Rx.  Problem # 2:  HYPERTENSION (ICD-401.9) Resume norvasc and lisinopril. Will start back at lower doses since BP is only mildly elevated today.  The following medications were removed from the medication list:    Norvasc 10 Mg Tabs (Amlodipine besylate) .Marland Kitchen... Take 1 tablet by mouth once a day    Lisinopril 40 Mg Tabs (Lisinopril) .Marland Kitchen... Take one tablet by mouth daily    Furosemide 20 Mg Tabs (Furosemide) .Marland Kitchen... Take one tablet by mouth daily. His updated medication list for this problem includes:    Metoprolol Tartrate 25 Mg Tabs (Metoprolol tartrate) .Marland Kitchen... Take one tablet by mouth twice a day    Amlodipine Besylate 5 Mg Tabs (Amlodipine besylate) .Marland Kitchen... Take one tablet by mouth daily    Lisinopril 20 Mg Tabs (Lisinopril) .Marland Kitchen... Take one tablet by mouth daily  Problem # 3:  HYPERLIPIDEMIA (ICD-272.4) Due for repeat lipids and lft's. Will recheck in 2 weeks at the time of his bmet to monitor K and Creatinine after he starts back on lisinopril.  His updated medication list for this problem includes:    Lipitor 80 Mg Tabs (Atorvastatin calcium) .Marland Kitchen... 1 by mouth once daily  CHOL: 157 (08/01/2008)   LDL: 86 (08/01/2008)   HDL: 56.2 (08/01/2008)   TG: 75 (08/01/2008)  Patient Instructions: 1)  Your physician recommends that you return for a FASTING LIPID, LIVER and BMP in 2 WEEKS (394.1, 401.9, 272.0) Nothing to eat or drink after midnight.   2)  Your physician has recommended you make the following change in your medication: START Amlodipine 5mg  once a day, START Lisinopril 20mg  once a day 3)  Your physician wants you to follow-up in:   6 MONTHS. You will receive a reminder letter in the mail two months in advance. If you don't receive a letter, please call our office to schedule the follow-up appointment. Prescriptions: LISINOPRIL 20 MG TABS (LISINOPRIL) Take one tablet by mouth daily  #30 x 11   Entered by:   Julieta Gutting, RN, BSN    Authorized by:   Norva Karvonen, MD   Signed by:   Julieta Gutting, RN, BSN on 10/18/2009   Method used:   [facility] [phone]* (retail)       [address]       [place], [place]  [address]       Ph: [phone] or [phone]       Fax: [phone]   RxID:   [id number] AMLODIPINE BESYLATE 5 MG TABS (AMLODIPINE BESYLATE) Take one tablet by mouth daily  #30 x 11   Entered by:   Leotis Shames  Manson Passey, RN, BSN   Authorized by:   Norva Karvonen, MD   Signed by:   Julieta Gutting, RN, BSN on 10/18/2009   Method used:   [facility]        [facility] [phone]* (retail)       [address]       [place], [place]  [address]       Ph: [phone] or [phone]       Fax: [phone]   RxID:   [id number]

## 2010-08-19 NOTE — Progress Notes (Signed)
Summary: pt is out of meds and needs it called into today  Phone Note Refill Request Message from:  [facility] [address]  Refills Requested: Medication #1:  avelox 400mg  qd  Medication #2:  METOPROLOL TARTRATE 25 MG TABS Take one tablet by mouth twice a day  Medication #3:  LIPITOR 80 MG  TABS 1 by mouth once daily Initial call taken by: Omer Jack,  September 09, 2009 12:08 PM  Follow-up for Phone Call        Metoprolol and Lipitor refills sended to pharmacy. Pt. notified to request Avelox refill from prescriber Follow-up by: Vikki Ports,  September 09, 2009 12:18 PM    Prescriptions: METOPROLOL TARTRATE 25 MG TABS (METOPROLOL TARTRATE) Take one tablet by mouth twice a day  #60 Tablet x 6   Entered by:   Vikki Ports   Authorized by:   Norva Karvonen, MD   Signed by:   Vikki Ports on 09/09/2009   Method used:   F[facility]ed to ...       [facility] [phone]* (retail)       [address]       [place], [place]  [address]       Ph: [phone] or [phone]       Fax: [phone]   RxID:   [id number] LIPITOR 80 MG  TABS (ATORVASTATIN CALCIUM) 1 by mouth once daily  #30 Tablet x 6   Entered by:   Vikki Ports   Authorized by:   Norva Karvonen, MD   Signed by:   Vikki Ports on 09/09/2009   Method used:   Faxed to ...       [facility]  [address] [phone]* (retail)       [address]       [place], [place]  [address]       Ph: [phone] or [phone]       Fax: [phone]   RxID:   [id number]

## 2010-10-09 ENCOUNTER — Other Ambulatory Visit: Payer: Self-pay | Admitting: *Deleted

## 2010-10-09 DIAGNOSIS — I10 Essential (primary) hypertension: Secondary | ICD-10-CM

## 2010-10-09 DIAGNOSIS — E785 Hyperlipidemia, unspecified: Secondary | ICD-10-CM

## 2010-10-09 MED ORDER — AMLODIPINE BESYLATE 5 MG PO TABS: 5.0000 mg | ORAL_TABLET | Freq: Every day | ORAL | Status: DC

## 2010-10-09 MED ORDER — METOPROLOL TARTRATE 25 MG PO TABS: 25.0000 mg | ORAL_TABLET | Freq: Two times a day (BID) | ORAL | Status: DC

## 2010-10-09 MED ORDER — ATORVASTATIN CALCIUM 80 MG PO TABS: 80.0000 mg | ORAL_TABLET | Freq: Every day | ORAL | Status: DC

## 2010-10-09 MED ORDER — LISINOPRIL-HYDROCHLOROTHIAZIDE 20-25 MG PO TABS: 1.0000 | ORAL_TABLET | Freq: Every day | ORAL | Status: DC

## 2010-10-23 LAB — BASIC METABOLIC PANEL
BUN: 14 mg/dL (ref 6–23)
CO2: 24 mEq/L (ref 19–32)
CO2: 28 mEq/L (ref 19–32)
Calcium: 9.1 mg/dL (ref 8.4–10.5)
Chloride: 101 mEq/L (ref 96–112)
Chloride: 106 mEq/L (ref 96–112)
GFR calc Af Amer: 57 mL/min — ABNORMAL LOW (ref >60)
GFR calc Af Amer: 59 mL/min — ABNORMAL LOW (ref >60)
GFR calc non Af Amer: 49 mL/min — ABNORMAL LOW (ref >60)
GFR calc non Af Amer: 49 mL/min — ABNORMAL LOW (ref >60)
Glucose, Bld: 103 mg/dL — ABNORMAL HIGH (ref 70–99)
Glucose, Bld: 107 mg/dL — ABNORMAL HIGH (ref 70–99)
Glucose, Bld: 125 mg/dL — ABNORMAL HIGH (ref 70–99)
Potassium: 3.2 mEq/L — ABNORMAL LOW (ref 3.5–5.1)
Potassium: 3.8 mEq/L (ref 3.5–5.1)
Sodium: 137 mEq/L (ref 135–145)
Sodium: 138 mEq/L (ref 135–145)
Sodium: 140 mEq/L (ref 135–145)

## 2010-10-23 LAB — CBC
HCT: 28.8 % — ABNORMAL LOW (ref 39.0–52.0)
HCT: 29.8 % — ABNORMAL LOW (ref 39.0–52.0)
HCT: 30.4 % — ABNORMAL LOW (ref 39.0–52.0)
HCT: 36.4 % — ABNORMAL LOW (ref 39.0–52.0)
Hemoglobin: 10.3 g/dL — ABNORMAL LOW (ref 13.0–17.0)
Hemoglobin: 10.3 g/dL — ABNORMAL LOW (ref 13.0–17.0)
Hemoglobin: 10.8 g/dL — ABNORMAL LOW (ref 13.0–17.0)
Hemoglobin: 10.9 g/dL — ABNORMAL LOW (ref 13.0–17.0)
Hemoglobin: 9.8 g/dL — ABNORMAL LOW (ref 13.0–17.0)
MCHC: 34.4 g/dL (ref 30.0–36.0)
MCHC: 34.5 g/dL (ref 30.0–36.0)
MCHC: 34.6 g/dL (ref 30.0–36.0)
MCHC: 34.4 g/dL (ref 30.0–36.0)
MCV: 94.7 fL (ref 78.0–100.0)
MCV: 94.8 fL (ref 78.0–100.0)
MCV: 95.6 fL (ref 78.0–100.0)
Platelets: 169 10*3/uL (ref 150–400)
Platelets: 129 10*3/uL — ABNORMAL LOW (ref 150–400)
RBC: 3.02 MIL/uL — ABNORMAL LOW (ref 4.22–5.81)
RBC: 3.15 MIL/uL — ABNORMAL LOW (ref 4.22–5.81)
RBC: 3.19 MIL/uL — ABNORMAL LOW (ref 4.22–5.81)
RBC: 3.28 MIL/uL — ABNORMAL LOW (ref 4.22–5.81)
RBC: 3.35 MIL/uL — ABNORMAL LOW (ref 4.22–5.81)
RDW: 13.4 % (ref 11.5–15.5)
RDW: 13.4 % (ref 11.5–15.5)
RDW: 13.6 % (ref 11.5–15.5)
RDW: 13.7 % (ref 11.5–15.5)
WBC: 11.4 10*3/uL — ABNORMAL HIGH (ref 4.0–10.5)
WBC: 13.2 10*3/uL — ABNORMAL HIGH (ref 4.0–10.5)

## 2010-10-23 LAB — POCT I-STAT 4, (NA,K, GLUC, HGB,HCT)
Glucose, Bld: 115 mg/dL — ABNORMAL HIGH (ref 70–99)
Glucose, Bld: 151 mg/dL — ABNORMAL HIGH (ref 70–99)
Glucose, Bld: 91 mg/dL (ref 70–99)
Glucose, Bld: 98 mg/dL (ref 70–99)
HCT: 19 % — ABNORMAL LOW (ref 39.0–52.0)
HCT: 23 % — ABNORMAL LOW (ref 39.0–52.0)
HCT: 24 % — ABNORMAL LOW (ref 39.0–52.0)
HCT: 24 % — ABNORMAL LOW (ref 39.0–52.0)
HCT: 33 % — ABNORMAL LOW (ref 39.0–52.0)
Hemoglobin: 11.2 g/dL — ABNORMAL LOW (ref 13.0–17.0)
Hemoglobin: 8.2 g/dL — ABNORMAL LOW (ref 13.0–17.0)
Potassium: 3.3 mEq/L — ABNORMAL LOW (ref 3.5–5.1)
Potassium: 3.5 mEq/L (ref 3.5–5.1)
Potassium: 3.6 mEq/L (ref 3.5–5.1)
Potassium: 4 mEq/L (ref 3.5–5.1)
Sodium: 139 mEq/L (ref 135–145)
Sodium: 140 mEq/L (ref 135–145)
Sodium: 141 mEq/L (ref 135–145)
Sodium: 141 mEq/L (ref 135–145)

## 2010-10-23 LAB — GLUCOSE, CAPILLARY
Glucose-Capillary: 101 mg/dL — ABNORMAL HIGH (ref 70–99)
Glucose-Capillary: 130 mg/dL — ABNORMAL HIGH (ref 70–99)
Glucose-Capillary: 135 mg/dL — ABNORMAL HIGH (ref 70–99)

## 2010-10-23 LAB — TYPE AND SCREEN
ABO/RH(D): O POS
Antibody Screen: NEGATIVE

## 2010-10-23 LAB — URINE MICROSCOPIC-ADD ON

## 2010-10-23 LAB — PREPARE PLATELETS

## 2010-10-23 LAB — POCT I-STAT 3, ART BLOOD GAS (G3+)
Acid-base deficit: 2 mmol/L (ref 0.0–2.0)
Acid-base deficit: 2 mmol/L (ref 0.0–2.0)
Bicarbonate: 24 mEq/L (ref 20.0–24.0)
Bicarbonate: 27 mEq/L — ABNORMAL HIGH (ref 20.0–24.0)
O2 Saturation: 95 %
O2 Saturation: 96 %
O2 Saturation: 99 %
Patient temperature: 12.03
Patient temperature: 35.1
TCO2: 25 mmol/L (ref 0–100)
TCO2: 25 mmol/L (ref 0–100)
TCO2: 25 mmol/L (ref 0–100)
TCO2: 28 mmol/L (ref 0–100)
pCO2 arterial: 34.6 mmHg — ABNORMAL LOW (ref 35.0–45.0)
pCO2 arterial: 42.3 mmHg (ref 35.0–45.0)
pO2, Arterial: 125 mmHg — ABNORMAL HIGH (ref 80.0–100.0)
pO2, Arterial: 408 mmHg — ABNORMAL HIGH (ref 80.0–100.0)

## 2010-10-23 LAB — PREPARE FRESH FROZEN PLASMA

## 2010-10-23 LAB — COMPREHENSIVE METABOLIC PANEL
AST: 28 U/L (ref 0–37)
Albumin: 4.5 g/dL (ref 3.5–5.2)
BUN: 18 mg/dL (ref 6–23)
CO2: 26 mEq/L (ref 19–32)
Calcium: 9.9 mg/dL (ref 8.4–10.5)
Creatinine, Ser: 1.13 mg/dL (ref 0.4–1.5)
GFR calc Af Amer: >60 mL/min (ref >60)
GFR calc non Af Amer: >60 mL/min (ref >60)

## 2010-10-23 LAB — POCT I-STAT, CHEM 8
Calcium, Ion: 1.17 mmol/L (ref 1.12–1.32)
Glucose, Bld: 142 mg/dL — ABNORMAL HIGH (ref 70–99)
HCT: 31 % — ABNORMAL LOW (ref 39.0–52.0)
Hemoglobin: 10.5 g/dL — ABNORMAL LOW (ref 13.0–17.0)
Potassium: 3.8 mEq/L (ref 3.5–5.1)

## 2010-10-23 LAB — BLOOD GAS, ARTERIAL
Acid-Base Excess: 2.1 mmol/L — ABNORMAL HIGH (ref 0.0–2.0)
Drawn by: 181601
O2 Saturation: 97.6 %
TCO2: 27.7 mmol/L (ref 0–100)

## 2010-10-23 LAB — PROTIME-INR
INR: 1.39 (ref 0.00–1.49)
INR: 1.01 (ref 0.00–1.49)
Prothrombin Time: 16.9 seconds — ABNORMAL HIGH (ref 11.6–15.2)
Prothrombin Time: 13.2 seconds (ref 11.6–15.2)

## 2010-10-23 LAB — MAGNESIUM: Magnesium: 2.5 mg/dL (ref 1.5–2.5)

## 2010-10-23 LAB — URINALYSIS, ROUTINE W REFLEX MICROSCOPIC
Glucose, UA: NEGATIVE mg/dL
Ketones, ur: NEGATIVE mg/dL
Protein, ur: NEGATIVE mg/dL
Urobilinogen, UA: 0.2 mg/dL (ref 0.0–1.0)

## 2010-10-23 LAB — CREATININE, SERUM
Creatinine, Ser: 1.32 mg/dL (ref 0.4–1.5)
GFR calc non Af Amer: 52 mL/min — ABNORMAL LOW (ref >60)

## 2010-10-23 LAB — HEMOGLOBIN A1C: Mean Plasma Glucose: 108 mg/dL

## 2010-10-24 LAB — BLOOD GAS, ARTERIAL
Bicarbonate: 21.6 mEq/L (ref 20.0–24.0)
Drawn by: 145321
O2 Content: 1 L/min
pCO2 arterial: 32.1 mmHg — ABNORMAL LOW (ref 35.0–45.0)
pH, Arterial: 7.443 (ref 7.350–7.450)
pO2, Arterial: 56.7 mmHg — ABNORMAL LOW (ref 80.0–100.0)

## 2010-10-24 LAB — POCT I-STAT 3, ART BLOOD GAS (G3+)
Acid-Base Excess: 2 mmol/L (ref 0.0–2.0)
O2 Saturation: 95 %
TCO2: 28 mmol/L (ref 0–100)
pCO2 arterial: 41 mmHg (ref 35.0–45.0)

## 2010-10-24 LAB — BASIC METABOLIC PANEL
BUN: 12 mg/dL (ref 6–23)
BUN: 11 mg/dL (ref 6–23)
CO2: 26 mEq/L (ref 19–32)
CO2: 29 mEq/L (ref 19–32)
Calcium: 9 mg/dL (ref 8.4–10.5)
Calcium: 9.4 mg/dL (ref 8.4–10.5)
Calcium: 9.2 mg/dL (ref 8.4–10.5)
Chloride: 109 mEq/L (ref 96–112)
Creatinine, Ser: 1.17 mg/dL (ref 0.4–1.5)
Creatinine, Ser: 1.16 mg/dL (ref 0.4–1.5)
Creatinine, Ser: 1.22 mg/dL (ref 0.4–1.5)
GFR calc Af Amer: >60 mL/min (ref >60)
GFR calc Af Amer: >60 mL/min (ref >60)
GFR calc non Af Amer: 60 mL/min — ABNORMAL LOW (ref >60)
GFR calc non Af Amer: >60 mL/min (ref >60)
GFR calc non Af Amer: 57 mL/min — ABNORMAL LOW (ref >60)
Glucose, Bld: 94 mg/dL (ref 70–99)
Glucose, Bld: 94 mg/dL (ref 70–99)
Potassium: 3.7 mEq/L (ref 3.5–5.1)
Potassium: 3.5 mEq/L (ref 3.5–5.1)
Sodium: 141 mEq/L (ref 135–145)
Sodium: 141 mEq/L (ref 135–145)
Sodium: 146 mEq/L — ABNORMAL HIGH (ref 135–145)

## 2010-10-24 LAB — CBC
HCT: 36.6 % — ABNORMAL LOW (ref 39.0–52.0)
Hemoglobin: 12.2 g/dL — ABNORMAL LOW (ref 13.0–17.0)
Hemoglobin: 12.2 g/dL — ABNORMAL LOW (ref 13.0–17.0)
MCHC: 33.2 g/dL (ref 30.0–36.0)
MCHC: 33.7 g/dL (ref 30.0–36.0)
MCV: 97.4 fL (ref 78.0–100.0)
Platelets: 138 10*3/uL — ABNORMAL LOW (ref 150–400)
RBC: 3.73 MIL/uL — ABNORMAL LOW (ref 4.22–5.81)
RBC: 3.76 MIL/uL — ABNORMAL LOW (ref 4.22–5.81)
RDW: 13.9 % (ref 11.5–15.5)
RDW: 14.4 % (ref 11.5–15.5)
WBC: 6.3 10*3/uL (ref 4.0–10.5)

## 2010-10-24 LAB — URINALYSIS, ROUTINE W REFLEX MICROSCOPIC
Leukocytes, UA: NEGATIVE
Protein, ur: 30 mg/dL — AB
Urobilinogen, UA: 0.2 mg/dL (ref 0.0–1.0)

## 2010-10-24 LAB — COMPREHENSIVE METABOLIC PANEL
ALT: 41 U/L (ref 0–53)
AST: 31 U/L (ref 0–37)
Alkaline Phosphatase: 53 U/L (ref 39–117)
CO2: 25 mEq/L (ref 19–32)
Calcium: 8.9 mg/dL (ref 8.4–10.5)
GFR calc Af Amer: >60 mL/min (ref >60)
Glucose, Bld: 103 mg/dL — ABNORMAL HIGH (ref 70–99)
Potassium: 3 mEq/L — ABNORMAL LOW (ref 3.5–5.1)
Sodium: 144 mEq/L (ref 135–145)
Total Protein: 6.1 g/dL (ref 6.0–8.3)

## 2010-10-24 LAB — DIFFERENTIAL
Basophils Absolute: 0 10*3/uL (ref 0.0–0.1)
Basophils Relative: 0 % (ref 0–1)
Basophils Relative: 0 % (ref 0–1)
Eosinophils Absolute: 0.1 10*3/uL (ref 0.0–0.7)
Eosinophils Relative: 1 % (ref 0–5)
Lymphocytes Relative: 15 % (ref 12–46)
Lymphs Abs: 0.7 10*3/uL (ref 0.7–4.0)
Monocytes Relative: 6 % (ref 3–12)
Monocytes Relative: 8 % (ref 3–12)
Neutro Abs: 4.8 10*3/uL (ref 1.7–7.7)
Neutrophils Relative %: 76 % (ref 43–77)
Neutrophils Relative %: 83 % — ABNORMAL HIGH (ref 43–77)

## 2010-10-24 LAB — CARDIAC PANEL(CRET KIN+CKTOT+MB+TROPI)
CK, MB: 6.3 ng/mL — ABNORMAL HIGH (ref 0.3–4.0)
CK, MB: 4.4 ng/mL — ABNORMAL HIGH (ref 0.3–4.0)
Relative Index: 1.7 (ref 0.0–2.5)
Total CK: 266 U/L — ABNORMAL HIGH (ref 7–232)
Troponin I: 0.03 ng/mL (ref 0.00–0.06)
Troponin I: 0.03 ng/mL (ref 0.00–0.06)

## 2010-10-24 LAB — POCT I-STAT 3, VENOUS BLOOD GAS (G3P V)
Acid-Base Excess: 3 mmol/L — ABNORMAL HIGH (ref 0.0–2.0)
Acid-Base Excess: 3 mmol/L — ABNORMAL HIGH (ref 0.0–2.0)
Bicarbonate: 28.6 mEq/L — ABNORMAL HIGH (ref 20.0–24.0)
O2 Saturation: 69 %
TCO2: 30 mmol/L (ref 0–100)
TCO2: 30 mmol/L (ref 0–100)
pCO2, Ven: 47.9 mmHg (ref 45.0–50.0)
pO2, Ven: 37 mmHg (ref 30.0–45.0)

## 2010-10-24 LAB — LIPID PANEL
Cholesterol: 155 mg/dL (ref 0–200)
LDL Cholesterol: 84 mg/dL (ref 0–99)
Total CHOL/HDL Ratio: 2.6 RATIO
Triglycerides: 53 mg/dL (ref <150)

## 2010-10-24 LAB — LACTIC ACID, PLASMA: Lactic Acid, Venous: 1.5 mmol/L (ref 0.5–2.2)

## 2010-10-24 LAB — URINE CULTURE: Colony Count: 8000

## 2010-10-24 LAB — BRAIN NATRIURETIC PEPTIDE: Pro B Natriuretic peptide (BNP): 696 pg/mL — ABNORMAL HIGH (ref 0.0–100.0)

## 2010-10-24 LAB — URINE MICROSCOPIC-ADD ON

## 2010-10-27 LAB — COMPREHENSIVE METABOLIC PANEL
Albumin: 4.3 g/dL (ref 3.5–5.2)
Alkaline Phosphatase: 52 U/L (ref 39–117)
BUN: 18 mg/dL (ref 6–23)
CO2: 29 mEq/L (ref 19–32)
Chloride: 108 mEq/L (ref 96–112)
Creatinine, Ser: 1.27 mg/dL (ref 0.4–1.5)
GFR calc non Af Amer: 55 mL/min — ABNORMAL LOW (ref >60)
Glucose, Bld: 114 mg/dL — ABNORMAL HIGH (ref 70–99)
Potassium: 4 mEq/L (ref 3.5–5.1)
Total Bilirubin: 1.4 mg/dL — ABNORMAL HIGH (ref 0.3–1.2)

## 2010-10-27 LAB — URINALYSIS, ROUTINE W REFLEX MICROSCOPIC
Nitrite: POSITIVE — AB
Protein, ur: >300 mg/dL — AB
Urobilinogen, UA: 0.2 mg/dL (ref 0.0–1.0)

## 2010-10-27 LAB — CBC
HCT: 36.7 % — ABNORMAL LOW (ref 39.0–52.0)
Hemoglobin: 12.5 g/dL — ABNORMAL LOW (ref 13.0–17.0)
MCV: 97.6 fL (ref 78.0–100.0)
Platelets: 235 10*3/uL (ref 150–400)
RBC: 3.76 MIL/uL — ABNORMAL LOW (ref 4.22–5.81)
WBC: 9.4 10*3/uL (ref 4.0–10.5)

## 2010-10-27 LAB — DIFFERENTIAL
Basophils Absolute: 0 10*3/uL (ref 0.0–0.1)
Basophils Relative: 0 % (ref 0–1)
Lymphocytes Relative: 8 % — ABNORMAL LOW (ref 12–46)
Monocytes Absolute: 0.3 10*3/uL (ref 0.1–1.0)
Neutro Abs: 8.4 10*3/uL — ABNORMAL HIGH (ref 1.7–7.7)

## 2010-10-27 LAB — APTT: aPTT: 34 seconds (ref 24–37)

## 2010-10-27 LAB — URINE CULTURE

## 2010-10-27 LAB — PROTIME-INR: Prothrombin Time: 14.2 seconds (ref 11.6–15.2)

## 2010-11-05 ENCOUNTER — Ambulatory Visit: Payer: Self-pay | Admitting: Cardiovascular Disease

## 2010-12-02 NOTE — Assessment & Plan Note (Signed)
[facility]                            CARDIOLOGY OFFICE NOTE   STONY, STEGMANN                        MRN:          [id number]  DATE:10/07/2007                            DOB:          1927/05/01    HISTORY:  Chad Delacruz returns for followup with our cardiology office  on October 07, 2007.  Chad Delacruz is a 77 year old gentleman with  myxomatous mitral valve disease and severe mitral regurgitation.  He  continues to do well from a cardiac standpoint and is currently  asymptomatic.  He specifically denies chest pain, dyspnea, orthopnea,  PND or edema.  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  Unfortunately he  has run out on his blood pressure medications and has not had them  refilled because he has been feeling well.   CURRENT MEDICATIONS:  1. Amlodipine 5 mg daily.  2. Lisinopril/HCT 20/12.5 mg daily.  3. Metoprolol 25 mg daily.  4. Lipitor 80 mg daily.  5. Protonix 40 mg daily.  6. K-Dur 20 mEq daily.   DRUG INTOLERANCE:  ASPIRIN causes bleeding.   PHYSICAL EXAMINATION:  GENERAL:  On exam the patient is alert and  oriented.  He is in no distress.  He is an elderly gentleman.  VITAL SIGNS:  Weight is 178, blood pressure 148/68, heart rate 58,  respiratory rate 16.  HEENT:  Normal.  NECK:  Normal carotid upstrokes with soft bilateral carotid bruits.  Jugular venous pressure is normal.  LUNGS:  Clear bilaterally.  HEART:  Regular rate and rhythm with a 3/6 holosystolic murmur loudest  at the apex.  There are no diastolic murmurs or gallops.  ABDOMEN:  Soft, nontender.  No organomegaly.  EXTREMITIES:  No clubbing, cyanosis or edema.   LABS:  From March 13 showed a sodium 146, potassium 3.6, creatinine 1.2.   ASSESSMENT:  1. Chronic severe mitral regurgitation.  Remains asymptomatic.      Followup echo to assess LV dimensions at the time of his return      visit in 6  months.  I talked with Chad Delacruz about restarting his      antihypertensive medicines as blood pressure control is critically      important in this gentleman with severe mitral regurgitation.  2. Dyslipidemia.  He remains on Lipitor 80 mg.  Samples were given      today.  Recent lipid profile from June 23, 2007, showed a      cholesterol 145, LDL 70, HDL 65.  LFTs were normal.  Continue      current medications.  3. Hypertension.  See discussion above.  Will resume antihypertensive      medicines.  4. Followup.  I would like to see Chad Delacruz back in 6 months.     Veverly Fells. Excell Seltzer, MD  Electronically Signed    MDC/MedQ  DD: 10/07/2007  DT: 10/07/2007  Job #: [id number]   cc:   Rosalyn Gess. Norins, MD

## 2010-12-02 NOTE — Assessment & Plan Note (Signed)
[facility]                            CARDIOLOGY OFFICE NOTE   RAYANSH, HERBST                        MRN:          [id number]  DATE:12/30/2006                            DOB:          05-02-1927    Mr. Eissler is a very pleasant 77 year old black male with a long  history of mitral valve disease,  mitral valve prolapse, mitral  regurgitation, and normal LV. Patient has been followed in this practice  for 45 years. Most recent echocardiogram June 18, 2006 revealed  ejection fraction of 70% with wall motion abnormality, there was some  aortic valve thickness, transaortic gradient was 20. There was  thickening of the mitral valve and moderate prolapse of both leaflets  and severe mitral regurgitation. Left atrial was dilated. There was also  prolapse of the tricuspid valve.   The left ventricular dimensions, LV end  diastolic dimensions was 44,  systolic of 26. That was a significant improvement over the study of  July 2007. Patient is getting along well working daily with yard  maintenance. He has also had a history of hypertension, hyperlipidemia.   PRESENT MEDICATIONS:  Include;  1. Lisinopril/hydrochlorothiazide 20/12.5 b.i.d.  2. Lipitor 80.  3. Aspirin 81.  4. K-Dur 20.   Blood pressure today is 161/80, pulse 61 normal sinus rhythm.  GENERAL APPEARANCE: Normal.  JVP is not elevated. Carotid pulses palpable without bruits.  LUNGS: Clear.  CARDIAC EXAM: Reveals 3/6 atriosystolic murmur at the apex. No gallop.  ABDOMINAL EXAM: Normal. Liver and spleen not palpable.  EXTREMITIES: Normal.   IMPRESSION:  1. Severe mitral regurgitation related to bileaflet prolapse with      normal ejection fraction and normal left ventricular dimensions-      asymptomatic.  2. Hypertension, poorly controlled.  3. Hyperlipidemia, has not been on medicine for the past 2 or 3 weeks.   We plan to check a BMP, lipid profile, and Norvasc 5 mg to his  regimen.  I will have him see Herma Carson in 2 to 3 weeks and follow up  thereafter with Dr. Calton Dach. I should note his EKG was normal sinus  rhythm and inimal  criteria for LVH.   ADDENDUM:  I have also encouraged him to follow up with Dr. Debby Bud his  primary physician.     Cecil Cranker, MD, [facility]  Electronically Signed    EJL/MedQ  DD: 12/30/2006  DT: 12/30/2006  Job #: [id number]

## 2010-12-02 NOTE — H&P (Signed)
NAMEJHAMAL, Chad Delacruz NO.:  [id number]   MEDICAL RECORD NO.:  [id number]          PATIENT TYPE:  EMS   LOCATION:  ED                           FACILITY:  [facility]   PHYSICIAN:  Iva Boop, MD,FACGDATE OF BIRTH:  September 09, 1926   DATE OF ADMISSION:  04/11/2007  DATE OF DISCHARGE:                              HISTORY & PHYSICAL   CHIEF COMPLAINT:  Passing blood, dizzy.   HISTORY:  This is a 77 year old black man that has an apparent history  of peptic ulcer disease, though I cannot find any records in our  computer.  He has a long history of sensitivity to NSAIDs and aspirin  which apparently causes GI bleeding.  He says he was cared for by Dr.  Victorino Dike in the past.  Burgess Estelle, he went to church, came home and  felt a little dizzy and then had some maroon-colored stools and black  stools that sound like melena.  Subsequently, he had some more of that  and then again today he felt the same way and presented to the emergency  department around noon time where his hemoglobin was 6.  He has had some  maroon or red-colored stools since.  There is no abdominal pain, nausea,  vomiting, or weight loss.  There is no chronic history of this, though  this is similar to previous episodes of GI bleeding.  I think it may  have been 20 years ago or more when he had this before.  He did not  realize there was aspirin in Excedrin.   REVIEW OF SYSTEMS:  Negative for weight loss, fever or chills,  constitutional symptoms.  He has really done well over time.  He has  knee pain for which he was taking the Excedrin.  He has been a little  lightheaded or woozy but no vertigo.  Urinary function is normal.  There  is no back pain.  No skin rash.  All other review of systems are  negative.   MEDICATIONS:  1. Norvasc 10 mg daily.  2. Potassium chloride 20 mEq daily.  3. Lisinopril/HCTZ 20/12.5 twice daily.  4. He has been using the Excedrin.  5. Lipitor 80 mg daily.   PAST MEDICAL  HISTORY:  1. Hypertension.  2. Mitral regurgitation with an myxomatous bicuspid valve with      prolapse, mild aortic insufficiency as well.  Previously followed      by Dr. Graceann Congress, now followed by Dr. Tonny Bollman.  3. Peptic ulcer disease, as described above.  4. Osteoarthritis.  5. Dyslipidemia.  6. 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 Brunilda Payor).  7. Questionable history of prostate cancer.  He did not mention this,      though there was a bone scan from a number of years ago to      apparently follow up prostate cancer which was negative, and there      is no other evidence of that at this time.  Will clarify.   FAMILY HISTORY:  Ulcers and cancer are reported.   SOCIAL HISTORY:  The patient is  married.  He has I believe four children  with grandchildren and great-grandchildren.  He previously worked for  [employer], retired from that, and now works as a Nutritional therapist  still.   PHYSICAL EXAMINATION:  GENERAL:  Well-developed elderly black man in no  acute distress at this time, looking somewhat younger than stated age.  He is alert and x3.  VITAL SIGNS:  Temperature 97.3, pulse 73 and regular, respirations 18,  blood pressure 117/56.  HEENT:  The eyes are anicteric.  Pupils are round and reactive to light.  Mouth and posterior pharynx showed intact teeth, normal mucosa, though  the oral mucosa is somewhat pale.  There are no lesions.  NECK:  Supple without mass or thyromegaly.  LUNGS:  Clear.  HEART:  S1-S2.  There is a 2/6 to 3/6 systolic murmur heard best at the  left sternal border radiating to the axilla.  ABDOMEN:  Soft and nontender without organomegaly or mass.  Bowel sounds  are present.  RECTAL:  Not performed at this time.  EXTREMITIES:  No edema.  They are warm and dry.  DP pulses are intact.  Radial pulses intact.  NEUROLOGIC:  He is alert and oriented x3.  Cranial nerves II-XII grossly  intact.  Nonfocal neurologic exam.   LYMPHATICS:  No neck, supraclavicular, or inguinal adenopathy palpated.   LABORATORY DATA:  Hemoglobin on admission was 6.1 with an MCV of 94,  white count 7.4, platelets 211.  His CMET looks normal, except for BUN  31.  His total protein is slightly low at 5.8.  It is otherwise entirely  normal.  His coags are normal.   ASSESSMENT:  1. Gastrointestinal bleed which I think is an upper gastrointestinal      bleed based upon the BUN and creatinine and the melena.  A right      colonic bleed is certainly possible.  2. Significant anemia, posthemorrhagic.  3. Orthostatic symptoms.  4. Hypertension.  5. Mitral regurgitation.  6. Dyslipidemia.  7. Possible history of prostate cancer.  8. Osteoarthritis for which he was taking the Excedrin.  9. Intolerant and sensitive to aspirin and nonsteroidal anti-      inflammatory drugs.   PLAN:  1. Admit to step-down.  2. Hydrate with fluids and resuscitation, also transfuse 3 units of      packed red cells.  Will follow up hemoglobin and go from there.  3. Hold his blood pressure medicines and his Lipitor at this time.  4. IV Protonix infusion.  5. Plan for upper GI endoscopy tomorrow, sooner if the need arises,      but at this point he needs resuscitation with      crystalloids and blood first.  6. Further plans pending clinical course.   I appreciate the opportunity to care for this patient.      Iva Boop, MD,FACG  Electronically Signed     CEG/MEDQ  D:  04/11/2007  T:  04/12/2007  Job:  [id number]

## 2010-12-02 NOTE — Assessment & Plan Note (Signed)
[facility]                            CARDIOLOGY OFFICE NOTE   NICKALUS, THORNSBERRY                        MRN:          [id number]  DATE:01/13/2007                            DOB:          1927-04-24    PRIMARY CARE PHYSICIAN:  Rosalyn Gess. Norins, M.D.   CARDIOLOGIST:  He was followed by Dr. Corinda Gubler for 40+ years.  He will be  new to Dr. Tonny Bollman.   HISTORY OF PRESENT ILLNESS:  Mr. Hughett is a 77 year old male patient  with a long history of mitral valve disease with mitral valve prolapse  and severe mitral regurgitation related to bileaflet prolapse and normal  LV function.  He recently was seen by Dr. Corinda Gubler, and Norvasc was added  to his regimen for his blood pressure.  He was also set up for a follow-  up echocardiogram, which was done just a few minutes ago.  He returns to  the office today for followup.   He denies any chest pain or shortness of breath.  He denies any syncope  or near syncope.  He denies any orthopnea or paroxysmal nocturnal  dyspnea.  He denies any pedal edema.   CURRENT MEDICATIONS:  1. Lisinopril/HCTZ 20/12.5 mg twice daily.  2. Multivitamin.  3. Tylenol.  4. Lipitor 80 mg nightly.  5. Aspirin 81 mg daily.  6. K-Dur 20 mEq daily.  7. Norvasc 5 mg daily.  8. Tylenol p.r.n.   ALLERGIES:  ASPIRIN.   PHYSICAL EXAMINATION:  GENERAL:  He is a well-developed and well-  nourished male in no distress.  VITAL SIGNS:  Blood pressure is 148/88.  Pulse 64.  Weight is 183.2  pounds.  HEENT:  Normal.  NECK:  Without JVD at 90 degrees.  CARDIAC:  S1 and S2 with a 2-3/6 holosystolic murmur heard best at the  apex.  LUNGS:  Clear to auscultation bilaterally without rales, wheezes, or  rhonchi.  ABDOMEN:  Soft and nontender.  EXTREMITIES:  Without edema.  NEUROLOGIC:  He is alert and oriented x3.  Cranial nerves II-XII are  grossly intact.   IMPRESSION:  1. Hypertension:  Better controlled.  2. Severe mitral  regurgitation related to bileaflet with normal      ejection fraction and normal left ventricular dimensions,      asymptomatic.      a.     Repeat echocardiogram, results pending.  3. Treated dyslipidemia.   PLAN:  Patient presents to the office today for routine followup on his  blood pressure.  He could certainly tolerate an increase on his Norvasc  to 10 mg a day.  I have given him a new prescription.  He will have CMET  and lipids done later this month and will set him up for a followup with  Dr. Excell Seltzer in the next 4-6 weeks.      Tereso Newcomer, PA-C  Electronically Signed      Arturo Morton. Riley Kill, MD, [facility]  Electronically Signed   SW/MedQ  DD: 01/13/2007  DT: 01/13/2007  Job #: [id number]   cc:   Rosalyn Gess. Norins, MD

## 2010-12-02 NOTE — H&P (Signed)
HISTORY AND PHYSICAL EXAMINATION   April 19, 2009   Re:  Chad Delacruz, Chad Delacruz           DOB:  08-13-1926   DATE OF PLANNED HOSPITAL ADMISSION AND SURGERY:  May 07, 2009.   REASON FOR CONSULTATION:  Severe mitral regurgitation.   HISTORY OF PRESENT ILLNESS:  The patient is an 77 year old gentleman  from [place] with longstanding history of mitral valve prolapse and  mitral regurgitation who for many years had been followed by Dr. Glennon Hamilton.  More recently he has been followed by Dr. Tonny Bollman and  Dr. Illene Regulus.  His past medical history is otherwise been notable  only for mild hypertension and a remote history of peptic ulcer disease  as well as mild osteoarthritis and hyperlipidemia.  The patient has been  retired for 17 years, but he has remained quite active physically all of  his life.  He has been noted by his wife and family to have had gradual  increase in problems with short-term memory loss and intermittent  confusion.  However, the patient has remained physically active and  continues to drive an automobile and attend to most of his basic daily  needs.  The patient was hospitalized 2 weeks ago with acute exacerbation  of shortness of breath related to pulmonary edema class IV congestive  heart failure.  A 2-D echocardiogram performed at that time demonstrated  normal left ventricular size and function.  There was severe (4+) mitral  regurgitation with severe left atrial enlargement.  There was evidence  of significant pulmonary hypertension by echo.  There was trace aortic  regurgitation.  No other abnormalities were noted.  The patient was  treated with diuretics and improved rapidly.  He was discharged home and  subsequently underwent elective left and right heart catheterization by  Dr. Excell Seltzer on September 21.  This confirmed the presence of severe (4+)  mitral regurgitation with normal left ventricular systolic function.  There was no  significant coronary artery disease appreciated.  At the  time of catheterization, pulmonary artery pressures were mildly elevated  and measured 50/19 with pulmonary capillary wedge pressure of 17 and V  waves of 24.  Central venous pressure was 6.  The descending thoracic  abdominal aorta and iliac vessels were widely patent and free of  significant peripheral vascular disease.  The patient has been referred  to consider possible minimally invasive mitral valve repair or  replacement.   REVIEW OF SYSTEMS:  GENERAL:  The patient is slow to answer questions  and forgetful.  His wife responds and fills in most of the answers for  him.  However, the patient reports that he feels fine.  He reports  normal appetite and he has been eating well.  His weight has been  stable.  He denies any history of chest pain, chest tightness or chest  pressure either with activity or at rest.  He does admit to some  exertional shortness of breath that probably had been present for quite  some time but had not limited him much until his recent exacerbation of  shortness of breath 2 weeks ago.  Since hospital discharge, he is doing  better again.  He denies PND, orthopnea, lower extremity edema.  He  denies any tachy palpitations or syncopal episodes.  RESPIRATORY:  Negative.  The patient denies cough, either productive or  dry.  The patient denies hemoptysis or wheezing.  GASTROINTESTINAL:  Negative.  The patient has no difficulty  swallowing.  He has a remote history of peptic ulcer disease in the distant past but  he reports no history of hematochezia, hematemesis, melena recently.  He  denies symptoms of reflux.  MUSCULOSKELETAL:  Notable for mild degenerative arthritis.  NEUROLOGIC:  Notable for significant cognitive impairment with  significant short-term memory impairment and episodes intermittently of  confusion.  The patient denies any transient monocular blindness.  The  patient denies any history of  transient numbness or weakness involving  either upper or lower extremity.  GENITOURINARY:  Negative.  HEENT:  Negative.   PAST MEDICAL HISTORY:  1. Mitral valve prolapse with mitral regurgitation.  A heart murmur      was initially discovered on physical exam 50 years ago when the      patient tried to enlist in the [employer].  2. Hypertension.  3. Hyperlipidemia.  4. Degenerative arthritis.  5. Remote history of peptic ulcer disease.  6. Prostate cancer.  7. Dementia.   FAMILY HISTORY:  Noncontributory.   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  The patient  lives with his wife here in [place].  He has five grown children who  are supportive.  He is a nonsmoker and he denies alcohol consumption.   CURRENT MEDICATIONS:  Lisinopril 40 mg daily, Norvasc 10 mg daily, Lasix  20 mg daily, metoprolol 50 mg twice daily, Lipitor 80 mg daily,  potassium chloride 20 mEq daily.   DRUG ALLERGIES:  None known.  The patient does report sensitivity to  aspirin due to a history of ulcers in the distant past.   PHYSICAL EXAMINATION:  General:  The patient is a well-appearing African  American male who appears somewhat younger than stated age but in no  acute distress.  Vital Signs:  Blood pressure 107/59, pulse 52 and  regular, oxygen saturation 96% on room air.  HEENT:  Grossly  unrevealing.  Neck:  Supple.  There is no cervical or supraclavicular  lymphadenopathy.  There is no jugular venous distention.  No carotid  bruits are noted.  Lungs:  Auscultation of the chest reveals clear  breath sounds which are symmetrical bilaterally.  No wheezes or rhonchi  are noted.  Cardiovascular:  Regular rate and rhythm.  There is a very  prominent grade 4/6 holosystolic murmur heard at the apex with radiation  all across the precordium to the axilla and back.  No diastolic murmurs  are noted.  Abdomen:  Soft,  nondistended, nontender.  There are no  palpable masses.  Bowel sounds are present.  Extremities:  Warm and well-  perfused.  There is no lower extremity edema.  Femoral pulses are easily  palpable bilaterally.  Distal pulses are palpable in the posterior  tibial position.  There is no sign of significant venous insufficiency.  Rectal and GU exams are both deferred.  Neurologic:  Grossly nonfocal  and  symmetrical, but notable for the presence of likely significant  baseline dementia with significant short-term memory dysfunction.   DIAGNOSTIC TESTS:  A 2-D echocardiogram performed on April 03, 2009  is reviewed.  This demonstrates normal left ventricular systolic  function with ejection fraction estimated at 55-60%.  There is a severe  (4+) mitral regurgitation.  There is prolapse involving the middle  scallop of the posterior leaflet (P2).  The jet of regurgitation is  broad and directed somewhat anteriorly.  It fills the entire  left  atrium.  There is significant thickening of both, the anterior and  posterior leaflets as well as some significant annular calcification.  There is severe left atrial enlargement.  There is trace aortic  insufficiency.  No other abnormalities are noted.  There does not appear  to be tricuspid regurgitation of any significance.   Left and right heart catheterization performed by Dr. Excell Seltzer on  April 09, 2009 is reviewed.  This confirms the presence of severe  mitral regurgitation.  There is normal coronary artery anatomy with no  significant coronary artery disease.  The abdominal aorta and iliac  vessels are widely patent and free of significant disease.  No other  significant abnormalities are noted.   IMPRESSION:  Longstanding mitral valve prolapse with severe (4+) mitral  regurgitation and some baseline symptoms of exertional shortness of  breath with recent hospitalization 2 weeks ago for class IV congestive  heart failure and pulmonary  edema, now improved on medical therapy.  The  patient appears to be in fairly good shape physically for his age,  although he does clearly have significant baseline dementia with  cognitive dysfunction and significant short-term memory impairment.  Nonetheless he remains active and still functionally independent.  Functional anatomy of the mitral valve is notable for the presence of  significant leaflet and annular calcification, particularly involving  the posterior mitral annulus, such that the possible need for valve  replacement rather than valve repair will be increased in comparison  with most patients undergoing elective operations for mitral valve  prolapse with mitral regurgitation.   PLAN:  I have discussed options at length with the patient and his wife  here in the office today.  They understand the somewhat increased risks  of surgery under the circumstances given the patient's baseline  functional cognitive impairment.  Nonetheless, his long-term prognosis  with medical therapy would be guarded, and with successful surgery I  think he could do quite well for an extended period of time.  I think he  is a relatively good candidate for minimally invasive approach, although  based upon the functional anatomy of the mitral valve, there is a  somewhat higher than normal likelihood that his valve may need to be  replaced rather than repair if repair is simply not technically  feasible.  All of their questions have been addressed.  We tentatively  plan to proceed with surgery on Tuesday, October 19.  The patient and  his wife understand and accept all potential associated risks including  but not limited to risk of death, stroke, myocardial infarction,  congestive heart failure, respiratory failure, pneumonia, bleeding  requiring blood transfusion, arrhythmia, heart block with bradycardia  requiring permanent pacemaker, acute exacerbation of cognitive  impairment including possible  disorientation or delirium which might  substantially affect his postoperative recovery.  All of their questions  have been answered.  The patient and his family will return for final  follow up and consultation on Monday, October 18 prior to surgery.  In  addition, they understand that if his valve cannot be repaired, we will  plan to replace it using a bioprosthetic tissue valve to avoid the need  for long-term anticoagulation.  They understand that under these  circumstances, there would be some risk for late structural valve  deterioration and failure depending upon his longevity.   Salvatore Decent. Cornelius Moras, M.D.  Electronically Signed   CHO/MEDQ  D:  04/19/2009  T:  04/20/2009  Job:  [id number]   cc:  Rosalyn Gess Norins, MD  Iva Boop, MD,FACG

## 2010-12-02 NOTE — Assessment & Plan Note (Signed)
[place] [facility]                            CARDIOLOGY OFFICE NOTE   Chad Delacruz, Chad Delacruz                        MRN:          [id number]  DATE:03/23/2007                            DOB:          1927-03-09    HISTORY OF PRESENT ILLNESS:  Chad Delacruz is a 77 year old gentleman who  is a long-time patient of Dr. Clarisa Kindred.  I will be assuming his  care from here forward.  Chad Delacruz is a delightful gentleman who has a  long history of mitral valvular disease.  He has a myxomatous mitral  valve with bileaflet prolapse and associated severe mitral  regurgitation.  He has been managed medically over the years and has  done exceptionally well.  He is completely asymptomatic from a cardiac  standpoint.  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  He tells me that  yesterday he mowed five lawns with a push-mower.  He has no dyspnea,  orthopnea, PND, palpitations, lightheadedness or syncope.   His most recent echocardiogram  was performed on January 13, 2007, and  demonstrated normal left ventricular systolic function with an LVEF of  60 to 65%.  There was severe mitral regurgitation and marked left atrial  dilatation present.  There was also a notation of mild aortic root  dilatation and mild aortic insufficiency.   CURRENT MEDICATIONS:  1. Lisinopril/hydrochlorothiazide 20/12.5 mg twice daily.  2. Multivitamin daily.  3. Tylenol every morning.  4. Lipitor 80 mg daily.  5. K-Dur 20 mEq daily.  6. Norvasc 10 mg daily.   PHYSICAL EXAMINATION:  VITAL SIGNS:  Weight 182 pounds, blood pressure  170/70 in the right arm, 180/90 in the left arm.  Heart rate is 84,  respiratory rate 16.  GENERAL APPEARANCE:  The patient is alert and oriented.  He is in no  acute distress.  HEENT:  Normal.  NECK:  Brisk carotid upstrokes without bruits.  Jugular venous pressure  is normal.  LUNGS:  Clear to  auscultation bilaterally.  CARDIOVASCULAR:  The apex is mildly diffuse and laterally displaced.  Heart is regular rate and rhythm with a 3/6 holosystolic murmur at the  apex.  There are no diastolic murmurs or gallops present.  ABDOMEN:  Soft and nontender, no organomegaly.  EXTREMITIES:  No clubbing, cyanosis, or edema.  Peripheral pulses are 2+  and equal throughout.   ASSESSMENT:  1. Severe mitral regurgitation, asymptomatic.  Chad Delacruz has severe      mitral regurgitation in the setting of normal left ventricular      dimensions and normal left ventricular systolic function.  He does      not warrant surgical repair at this point based on those      parameters.  Some centers are performing mitral valve repair in      asymptomatic patients even in the setting of normal left      ventricular size.  However, considering Chad Delacruz' advanced age      and excellent functional capacity, I would  favor continued medical      therapy with treatment of his hypertension and appropriate      endocarditis prophylaxis prior to dental work and endoscopic      procedures.  2. Hypertension.  He continues to have suboptimal blood pressure      control in spite of recent changes in his medications.  I have      taken the liberty of adding metoprolol ER 25 mg daily to his      medical regimen.  I will continue to follow his blood pressure      closely.  3. Dyslipidemia.  He is on high dose atorvastatin.  I assume his      lipids are followed at Dr. Debby Bud' office.  I do not see any recent      lipid studies that have been performed here.  4. Follow-up.  I would like to see Chad Delacruz back in three months.      He will be due for repeat echocardiogram  next June.     Veverly Fells. Excell Seltzer, MD  Electronically Signed    MDC/MedQ  DD: 03/23/2007  DT: 03/23/2007  Job #: [id number]   cc:   Rosalyn Gess. Norins, MD

## 2010-12-02 NOTE — Assessment & Plan Note (Signed)
[facility]                            CARDIOLOGY OFFICE NOTE   Chad [facility]                        MRN:          [id number]  DATE:06/23/2007                            DOB:          1927-05-12    HISTORY OF PRESENT ILLNESS:  Chad [facility] returns for follow up at  [facility] office on June 23, 2007.  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  He continues to  be asymptomatic.  He maintains a remarkably active lifestyle and still  mows his lawn and does other physical activities with no symptoms.  He  specifically denies dyspnea, orthopnea, PND or edema.   Echocardiogram in June of this year showed normal LV systolic function  with an EF of 60-65% as well as normal left ventricular dimensions.  He  has severe mitral regurgitation with marked left atrial dilatation.   CURRENT MEDICATIONS:  1. Amlodipine 5 mg daily.  2. Lisinopril/HCTZ 20/12.5 mg daily.  3. Metoprolol succinate 25 mg daily.  4. Lipitor 80 mg daily.  5. Protonix 40 mg daily.   ALLERGIES:  DRUG INTOLERANCES:  ASPIRIN HAS CAUSED RECURRENT GASTRIC  ULCERS.   PHYSICAL EXAMINATION:  GENERAL:  Alert and oriented, elderly male, no  acute distress.  VITAL SIGNS:  Weight 183, blood pressure 150/72, heart rate 60.  HEENT:  Normal.  NECK:  Normal carotid upstrokes with soft bilateral carotid bruits.  Jugular venous pressure is normal.  LUNGS:  Clear to auscultation bilaterally.  HEART:  Regular rate and rhythm with a 3-4/6 holosystolic murmur at the  apex.  There are no diastolic murmurs or gallops.  The apex is dynamic  but discrete.  ABDOMEN:  Soft, nontender.  No organomegaly.  EXTREMITIES:  No cyanosis, clubbing or edema.   STUDIES:  EKG shows normal sinus rhythm with left atrial enlargement.   ASSESSMENT:  1. Severe mitral regurgitation.  The patient remains  asymptomatic.      Follow up echo this summer for his one year.  Follow up to reassess      LV dimensions.  2. Dyslipidemia.  The patient is on Lipitor 80 mg.  I cannot find      recent lipids, therefore, we are checking lipids and LFTs today.  3. Hypertension.  Blood pressure control remained an issues.  His      blood pressures have improved from prior visit.  I am going to      continue his current medications today without changes.   FOLLOWUP:  For follow up, I would like to see the patient back in six  months.  At that time, he will have his repeat echocardiogram.     Chad [facility]. Chad Seltzer, MD  Electronically Signed    MDC/MedQ  DD: 06/23/2007  DT: 06/23/2007  Job #: [id number]   cc:   Chad Gess. Norins, MD

## 2010-12-02 NOTE — Assessment & Plan Note (Signed)
OFFICE VISIT   Chad [facility], Chad [facility]  DOB:  Feb 02, 1927                                        July 29, 2009  CHART #:  [id number]   HISTORY OF PRESENT ILLNESS:  The patient returns for routine follow up  status post right miniature thoracotomy for mitral valve repair on  May 07, 2009.  His postoperative recovery has been entirely  uneventful.  He returns to the office today for further follow up.  The  patient is doing remarkably well.  He states that he cannot believe how  well he is doing.  He has no shortness of breath, and he reports that he  is out and about doing everything that he ever hoped to do at the age of  77 years old.  He has had no pain in his chest.  He has had no dizzy  spells or tachy palpitations.  He is eating well and sleeping well.  Overall, he has no complaints.  The remainder of his review of systems  is unremarkable.   CURRENT MEDICATIONS:  Lisinopril, metoprolol, Lipitor, and amlodipine.   PHYSICAL EXAMINATION:  Notable for well-appearing African American male  with blood pressure 176/80, pulse 56, oxygen saturation 92% on room air.  Examination of the chest reveals clear breath sounds which are  symmetrical bilaterally.  No wheezes or rhonchi are noted.  Cardiovascular exam is notable for regular rate and rhythm.  No murmurs,  rubs, or gallops are noted.  The abdomen is soft, nontender.  The  extremities are warm and well perfused.  There is no lower extremity  edema.  The remainder of his physical exam is unrevealing.   IMPRESSION:  The patient is doing remarkably well now approximately 3  months status post mitral valve repair.  He looks terrific.  At some  point, a followup echocardiogram would be reasonable to check baseline  left ventricular function and status of his repair.  To my knowledge, he  has not had a followup echocardiogram since his surgery.  Overall, he  looks terrific.   PLAN:  In the future, the patient  will call and return to see [place] as  needed.  All of his questions have been addressed.   Salvatore Decent. Cornelius Moras, M.D.  Electronically Signed   CHO/MEDQ  D:  07/29/2009  T:  07/30/2009  Job:  [id number]   cc:   Veverly Fells. Excell Seltzer, MD

## 2010-12-02 NOTE — Assessment & Plan Note (Signed)
OFFICE VISIT   Chad [facility], Chad [facility]  DOB:  10/26/1926                                        June 03, 2009  CHART #:  [id number]   HISTORY:  The patient returns to the office today for routine follow up  status post right miniature thoracotomy for mitral valve repair on  May 07, 2009.  The patient's postoperative recovery has been  remarkably uneventful.  He was initially discharged to the skilled  nursing facility, but he has already returned home with his wife.  Both  he and his wife are delighted at how well he has recovered and everybody  seems to comment that it does not appear as though he has had any sort  of surgery at all.  He has had no pain at all.  He has had no shortness  of breath.  His appetite is normal.  His activity level is remarkably  good.  His wife notes that he has not been sleeping through the night  since he got home, but other than that he seems to be his usual self and  he is very up and active and around during the day.  He has no other  complaints.  The remainder of his review of systems is unremarkable.   PHYSICAL EXAMINATION:  Notable for well-appearing male with blood  pressure 152/80, pulse 82 and regular, oxygen saturation 96% on room  air.  Examination of the chest reveals a miniature thoracotomy incision  that is healing nicely.  The chest tube sutures are removed.  Auscultation of the chest reveals clear breath sounds which are  symmetrical bilaterally.  No wheezes or rhonchi are noted.  Cardiovascular exam includes regular rate and rhythm.  No murmurs, rubs,  or gallops are noted.  The abdomen is soft, nontender.  The right groin  incision is healing nicely.  There is no surrounding edema or swelling.  There is no lower extremity edema.  The remainder of his physical exam  is unremarkable.   DIAGNOSTIC TESTS:  Chest x-ray performed today at the [facility] is reviewed.  This demonstrates clear lung  fields bilaterally  with a very small right pleural effusion and associated patchy right  lower lobe atelectasis.  No other abnormalities are noted.   IMPRESSION:  The patient is doing remarkably well overall.   PLAN:  I have encouraged the patient to continue to gradually increase  his physical activity as tolerated.  I have instructed his wife to cut  his dose of amiodarone to 200 mg daily.  I would not renew this  prescription when the current allotment runs out.  Overall, he seems to  be doing fine.  We will plan to see him back in 2 months for further  follow up.   Salvatore Decent. Cornelius Moras, M.D.  Electronically Signed   CHO/MEDQ  D:  06/03/2009  T:  06/04/2009  Job:  [id number]   cc:   Veverly Fells. Excell Seltzer, MD

## 2010-12-02 NOTE — Assessment & Plan Note (Signed)
OFFICE VISIT   Chad [facility], Chad [facility]  DOB:  Jun 01, 1927                                        May 06, 2009  CHART #:  [id number]   The patient returns to the office today for further followup and  consultation prior to surgery scheduled for tomorrow.  He was originally  seen in consultation on April 19, 2009, and a full history and physical  exam was dictated at that time.  Since then, the patient has remained  completely stable and he reports no new problems or complaints.  I spent  in excess of 20 minutes reviewing the indications, risks, and potential  benefits of surgery with the patient, his wife, and his daughter here in  the office today.  We plan to use a minimally invasive approach for  elective mitral valve repair.  They understand that if his valve cannot  be repaired, we will replace it using a bioprosthetic tissue valve.  They also understand that associated with that would be some risk for  late structural valve deterioration and failure, but given the patient's  advanced age a tissue valve would probably be the best choice under the  circumstances.  All of their questions have been addressed.  We plan to  proceed with surgery tomorrow as previously scheduled.   Salvatore Decent. Cornelius Moras, M.D.  Electronically Signed   CHO/MEDQ  D:  05/06/2009  T:  05/06/2009  Job:  [id number]

## 2010-12-02 NOTE — Assessment & Plan Note (Signed)
[facility]                         GASTROENTEROLOGY OFFICE NOTE   CHRISTIAAN, STREBECK                        MRN:          [id number]  DATE:05/11/2007                            DOB:          January 20, 1927    CHIEF COMPLAINT:  GI bleed secondary to ulcers.   PROBLEMS:  See my discharge summary of April 13, 2007.   Mr. Rister was admitted with multiple ulcers and melena, had a GI  bleed.  He has restarted on aspirin.  He has a known history of aspirin  sensitive ulcer disease for years.  He is now doing well.  No more  melena off aspirin.  He comes for follow-up.   MEDICATIONS:  Listed and reviewed on the chart.  He is on Protonix 40 mg  daily.   His last hemoglobin in the hospital was 9.8.   PHYSICAL EXAMINATION:  Height 5 feet 8 inches, weight 179 pounds, blood  pressure 132/62, pulse 68.  HEENT:  Eyes anicteric.  He is alert and oriented x3.   ASSESSMENT:  1. Gastrointestinal bleed with acute blood loss anemia secondary to      aspirin-induced ulcers.  This is improving clinically.  2. He has not had a colonoscopy since 1990.   PLAN:  1. Recheck CBC.  2. Continue Protonix or another PTI forever.  3. Avoid NSAIDS and aspirin.  4. Schedule screening colonoscopy.  Risks, benefits, and indications      are explained to the patient.  He understands and agrees to      proceed.  5. He may need some iron.  He is not on that right now.  Will see what      his next CBC shows.     Iva Boop, MD,FACG  Electronically Signed    CEG/MedQ  DD: 05/11/2007  DT: 05/11/2007  Job #: [id number]   cc:   Rosalyn Gess. Norins, MD

## 2010-12-02 NOTE — Assessment & Plan Note (Signed)
[facility]                            CARDIOLOGY OFFICE NOTE   Chad, Delacruz                        MRN:          [id number]  DATE:07/31/2008                            DOB:          [birth year]    REASON FOR VISIT:  Mitral regurgitation.   HISTORY OF PRESENT ILLNESS:  [name] is a very nice 78 year old  gentleman with myxomatous mitral valve disease and bi-leaflet prolapse.  He has severe mitral regurgitation.  He has been managed medically for  many years and has done well.  He has never had symptoms of heart  failure.  He was first diagnosed when he was in the [employer] in the 1950s.   The patient remains remarkably active.  He mows five lawns during the  spring and summer months with a self-propelled push mower.  He denies  chest pain, dyspnea, orthopnea, PND, or edema.  He sleeps on 1 pillow at  night.  He has no complaints.   MEDICATIONS:  1. Amlodipine 5 mg daily (currently not taking).  2. Lisinopril/HCTZ 20/12.5 mg daily.  3. Lipitor 80 mg daily.  4. Metoprolol 25 mg twice daily.  5. K-Dur 20 mEq daily (not taking).  6. Protonix 40 mg daily (not taking).   DRUG INTOLERANCE:  ASPIRIN:  The patient had severe GI bleeding  secondary to ulcers.   PHYSICAL EXAMINATION:  GENERAL:  The patient is alert and oriented  elderly male in no acute distress.  VITAL SIGNS:  Weight is 177 pounds, blood pressure 150/74, heart rate  62, and respiratory rate 16.  HEENT:  Normal.  NECK:  Normal carotid upstrokes.  No bruits.  JVP normal.  LUNGS:  Clear bilaterally.  HEART:  The apex is laterally displaced and apical impulse is dynamic.  The heart has regular rate and rhythm with a 3/6 holosystolic murmur,  best heard at the apex.  There are no diastolic murmurs or gallops.  ABDOMEN:  Soft and nontender.  No organomegaly.  EXTREMITIES:  No clubbing, cyanosis, or edema.  Peripheral pulses are  intact and equal.   EKG shows sinus rhythm with left  ventricular hypertrophy and heart rate  62 beats per minute; otherwise, within normal limits.   Echocardiogram was performed for surveillance of LV size and systolic  function.  It has not been officially interpreted.  However, I have  reviewed the images and it demonstrates vigorous LV systolic function  with an LVEF of at least 65%.  There is severe mitral regurgitation  present with marked left atrial dilatation.  The left atrial pressure is  likely elevated secondary to bowing of the interatrial septum.  There is  normal right ventricular function and only mild tricuspid regurgitation.  Left ventricular dimensions are within normal limits.   ASSESSMENT:  1. This is a 77 year old gentleman with asymptomatic severe mitral      regurgitation.  He remains remarkably active and highly functional.      He does not meet criteria for mitral valve repair in an      asymptomatic patient, as his left ventricular size and function  are      normal.  Continue current therapy and observation.  Follow up in 6      months.  We will continue with yearly echocardiograms to follow his      left ventricular size.  2. Hypertension with suboptimal control.  Continue lisinopril,      hydrochlorothiazide, and metoprolol.  Resume amlodipine 5 mg daily.      A prescription was called into his pharmacy.  3. Dyslipidemia.  Lipids were last drawn in December 2008.      Cholesterol was 145, triglycerides 47, HDL 65, and LDL 70.  We will      repeat lipids and LFTs.   For followup, I would like to see [name] in 6 months.     Veverly Fells. Excell Seltzer, MD  Electronically Signed    MDC/MedQ  DD: 07/31/2008  DT: 07/31/2008  Job #: [id number]   cc:   Rosalyn Gess. Norins, MD

## 2010-12-05 NOTE — Assessment & Plan Note (Signed)
[facility]                              CARDIOLOGY OFFICE NOTE   Chad [facility], Chad [facility]acruz                        MRN:          [id number]  DATE:06/15/2006                            DOB:          July 14, 1927    A very pleasant 77 year old black male with a long history of mitral valve  disease with moderate to severe mitral regurgitation, normal LV function.  Most recent echo December 29, 2005 revealed end diastolic dimension of 58 with  end systolic of 31.  EF was 65%.  There was moderate to severe mitral  regurgitation with bileaflet mitral valve prolapse.  There was a mild  gradient at the upper level of the upper septum.  There was no SAM of the  mitral valve.  The patient remains completely asymptomatic, very active  doing yard work.   He has 4 children, all of whom, I believe, are college graduates.  The  patient has no history of ischemic heart disease.  Apparently had a stress  test in the mid 90s that was negative and no symptoms.  He has no evidence  of abdominal aneurysm by ultrasound.   MEDICATIONS:  Include lisinopril/hydrochlorothiazide 20/12.5 b.i.d., Lipitor  80, aspirin 81, K-Dur 20.   Blood pressure 132/84, pulse 63, normal sinus rhythm.  GENERAL APPEARANCE:  Normal.  JVPs not elevated.  Carotid pulses palpable and equal without bruits.  LUNGS:  Clear.  CARDIAC:  Reveals 2/6 to 3/6 pansystolic murmur at the apex.  No gallop.  ABDOMEN:  Unremarkable.  Liver, spleen, and kidneys not palpable.  EXTREMITIES:  Normal.   IMPRESSION:  1. Moderate to severe mitral regurgitation related to bileaflet prolapse -      asymptomatic.  2. Hypertension, on therapy.  3. Hyperlipidemia, on therapy.   I have suggested followup 2D echo, lipid, and BMP.  I will see him back in 3  to 4 months or p.r.n.     E. Graceann Congress, MD, [facility]  Electronically Signed    EJL/MedQ  DD: 06/15/2006  DT: 06/15/2006  Job #: [id number]

## 2010-12-05 NOTE — Op Note (Signed)
[facility]  Patient:    Chad Delac[facility], Chad [facility]acruz Visit Number: [id number] MRN: [id number]          Service Type: NES Location: [facility] Attending Physician:  Lindaann Slough Dictated by:   Lindaann Slough, M.D. Proc. Date: 06/23/01 Admit Date:  06/23/2001   CC:         Lonzo Cloud. Kriste Basque, M.D. [facility]                           Operative Report  PREOPERATIVE DIAGNOSIS:  Chronic balanitis.  POSTOPERATIVE DIAGNOSIS:  Chronic balanitis.  PROCEDURE:  Circumcision.  SURGEON:  Lindaann Slough, M.D.  ANESTHESIA:  General.  INDICATION:  The patient is a 77 year old male who has been complaining of irritation of his foreskin. He has multiple superficial ulcerations his foreskin. He was treated with Mycolog ointment. He is scheduled today for a circumcision.  DESCRIPTION OF PROCEDURE:  Two circumferential incisions were made on the foreskin and the foreskin in between those two incisions was excised. Hemostasis was secured with electrocautery. Skin approximation was then done with #4-0 chromic.  A penile block was then done with 0.25% Marcaine.  The patient tolerated the procedure well and left the OR in satisfactory condition to postanesthesia care unit. Dictated by:   Lindaann Slough, M.D. Attending Physician:  Lindaann Slough DD:  06/23/01 TD:  06/23/01 Job: [id number]

## 2010-12-05 NOTE — Assessment & Plan Note (Signed)
[facility]                              CARDIOLOGY OFFICE NOTE   Chad Delac[facility], Chad Delacruz                        MRN:          [id number]  DATE:02/02/2006                            DOB:          29-Jun-1927    Very pleasant, nearly 77 year old black male with a long history of mitral  valve disease, mitral valve prolapse, moderate to severe MR.  Most recent  echocardiogram revealed LV end-diastolic dimension of 58 with end-systolic  of 31.  Patient had some dyspnea during April or early May; however, he had  stopped his lisinopril/HCTZ about that time.  He restarted this on May 16  and has had no recurrence.  He mows five lawns a day, feels quite well.  He  is on lisinopril/HCTZ 20/12.5 b.i.d., multivitamin, Tylenol, Lipitor 80,  aspirin 81, K-Dur 20.  Blood pressure 140/68, pulse 64, normal sinus rhythm.  __________ normal.  JVP is not elevated.  Carotid pulses palpable without  bruit.  Lungs clear.  Cardiac examination has 3/6 late onset systolic murmur  at the apex consistent with prolapse.  Abdominal examination unremarkable.  Extremities normal.   IMPRESSION:  Moderate to severe mitral regurgitation with normal ejection  fraction.  Left ventricular end-diastolic dimension increased somewhat from  the previous; however, have been some variation of serial echocardiograms.  Systolic dimension remains stable.  He also has mild gradient of the upper  septum.   I have suggested continue same therapy.  Will plan to repeat the  echocardiogram and BNP on return in four months.                              Cecil Cranker, MD, [facility]    EJL/MedQ  DD:  02/02/2006  DT:  02/02/2006  Job #:  [id number]

## 2010-12-05 NOTE — Discharge Summary (Signed)
Delacruz Delacruz NO.:  [id number]   MEDICAL RECORD NO.:  [id number]          Delacruz TYPE:  INP   LOCATION:  1308                         FACILITY:  [facility]   PHYSICIAN:  Iva Boop, MD,FACGDATE OF BIRTH:  1926-08-06   DATE OF ADMISSION:  04/11/2007  DATE OF DISCHARGE:  04/13/2007                               DISCHARGE SUMMARY   ADMITTING DIAGNOSES:  68. A 77 year old male with an acute upper gastrointestinal bleed      versus possible right colonic bleed.  2. Significant anemia, posthemorrhagic.  3. Orthostasis secondary to above.  4. History of hypertension.  5. Mitral regurgitation.  6. Dyslipidemia.  7. Possible history of prostate cancer.  8. Osteoarthritis.  9. Aspirin and nonsteroidal antiinflammatory insensitivity.   DISCHARGE DIAGNOSES:  1. Stable status post acute upper gastrointestinal bleed secondary to      aspirin induced gastric ulcers with multiple clean based antral      ulcers at esophagogastroduodenoscopy.  2. A 5-cm hiatal hernia.  3. Anemia, stable status post transfusions, posthemorrhagic.  4. Other diagnoses as listed above.   CONSULTATIONS:  None.   PROCEDURES:  Upper endoscopy per Delacruz Delacruz.   BRIEF HISTORY:  Delacruz Delacruz is a very nice 77 year old African American  male with an apparent history of peptic ulcer disease remotely.  Chad  Delacruz says that he has bled 3 times previously in his life and was  cared for in Chad remote past by Delacruz Delacruz.  On Chad day prior to  admission he went to church and then came home and felt a bit dizzy and  then had some maroonish colored stools, followed by black stools.  He  had more of that in Chad evening and then again on Chad morning of  admission, felt weak and somewhat dizzy and at Chad encouragement of his  wife presented to Chad emergency room for evaluation and was found to  have a hemoglobin of 6.  He was noted to have maroonish appearing stool,  had no complaints of  abdominal pain, nausea, vomiting, etc.  He did  admit that he was taking Excedrin generally 2 per day over Chad past  couple of months for arthritic symptoms and says that he did not realize  that Chad Excedrin had aspirin in it until his son had read Chad label Chad  day before.  He was admitted per Delacruz Delacruz with an acute presumed  upper GI bleed for stabilization and further medical management.   LABORATORY STUDIES:  On admission, September 22, WBC of 7.4, hemoglobin  6.1, hematocrit of 18.3, MCV of 94.  Follow-up on September 23,  hemoglobin of 7.7, hematocrit of 23 and on Chad 24th, hemoglobin 9.8,  hematocrit of 28.7, platelets 211.  Pro-time 14.2, INR of 1.1, PTT of  25.  Electrolytes within normal limits.  BUN was elevated on admission  at 31, creatinine 1.3, albumin 3.8.  Liver function studies normal.  Total bilirubin of 0.8.  His H. Pylori was negative.   HOSPITAL COURSE:  Delacruz Delacruz was admitted through Chad emergency room  per Delacruz Delacruz  who was covering on-call.  He was placed in Chad step  down unit, kept at bedrest, n.p.o., was volume resuscitated and  transfused 3 units of packed RBCs total over Chad next 24 hours.  He was  scheduled for endoscopy Chad following morning and at endoscopy was found  to have a 5-cm hiatal hernia and then multiple clean based antral ulcers  which were consistent with NSAID/aspirin induced ulcers.  There was no  blood in Chad stomach, a biopsy for H. Pylori was done.  He was  transferred to a regular bed thereafter, given a fourth unit of packed  cells.  His diet was gradually advanced.  He was placed on Protonix 40  mg orally daily and on Chad 24th his hemoglobin was stable, he had no  further evidence of any active bleeding and was allowed discharge to  home with instructions to follow up with Delacruz Delacruz in Chad office on  October 22 and 9:45 a.m., to call for any problems in Chad interim.  He  was to come to [facility] on Tuesday  September 30 for a CBC.   DISCHARGE MEDS:  Protonix 40 mg b.i.d. x 1 month and then daily  thereafter, Tylenol as needed for pain, Lipitor 80 mg daily, Norvasc 10  daily, KCl 20 mEq daily and lisinopril/HCTZ daily as previous.   DIET:  Regular.   CONDITION:  Stable.      [name], PA-C      Iva Boop, MD,FACG  Electronically Signed    AE/MEDQ  D:  04/18/2007  T:  04/18/2007  Job:  [id number]   cc:   Iva Boop, MD,FACG  [facility]  [address] [place], [place] [address]

## 2010-12-18 ENCOUNTER — Encounter: Payer: Self-pay | Admitting: Cardiovascular Disease

## 2010-12-19 ENCOUNTER — Encounter: Payer: Self-pay | Admitting: Cardiovascular Disease

## 2010-12-19 ENCOUNTER — Ambulatory Visit (INDEPENDENT_AMBULATORY_CARE_PROVIDER_SITE_OTHER): Payer: Medicare PPO | Admitting: Cardiovascular Disease

## 2010-12-19 DIAGNOSIS — I10 Essential (primary) hypertension: Secondary | ICD-10-CM

## 2010-12-19 DIAGNOSIS — F0390 Unspecified dementia without behavioral disturbance: Secondary | ICD-10-CM

## 2010-12-19 DIAGNOSIS — I080 Rheumatic disorders of both mitral and aortic valves: Secondary | ICD-10-CM

## 2010-12-19 DIAGNOSIS — I051 Rheumatic mitral insufficiency: Secondary | ICD-10-CM

## 2010-12-19 NOTE — Progress Notes (Signed)
HPI:  This is an 77 year old gentleman presenting for followup evaluation. The patient has a history of long-standing severe mitral regurgitation secondary to myxomatous mitral valve disease. He ultimately developed congestive heart failure symptoms and underwent mitral valve repair in October 2010. The patient was treated via a mini thoracotomy approach. He has done well from a cardiac standpoint since his surgery. He has had no problems with recurrent congestive heart failure.  The patient specifically denies chest pain, dyspnea, edema, palpitations, or lightheadedness. He has become less active over the past year. His wife notes that his memory because become significantly impaired and she would like him to have more evaluation and  treatment of that problem.  Outpatient Encounter Prescriptions as of 12/19/2010  Medication Sig Dispense Refill  . acetaminophen (PAIN RELIEF) 500 MG tablet Take 500 mg by mouth every 6 (six) hours as needed.        Marland Kitchen amLODipine (NORVASC) 5 MG tablet Take 1 tablet (5 mg total) by mouth daily.  30 tablet  11  . atorvastatin (LIPITOR) 80 MG tablet Take 1 tablet (80 mg total) by mouth daily.  30 tablet  11  . lisinopril-hydrochlorothiazide (PRINZIDE,ZESTORETIC) 20-25 MG per tablet Take 1 tablet by mouth daily.  30 tablet  11  . metoprolol tartrate (LOPRESSOR) 25 MG tablet Take 1 tablet (25 mg total) by mouth 2 (two) times daily.  60 tablet  11  . Multiple Vitamin (MULTIVITAMIN) tablet Take 1 tablet by mouth daily.          Allergies  Allergen Reactions  . Aspirin     REACTION: BLEEDING    Past Medical History  Diagnosis Date  . HYPERLIPIDEMIA   . MITRAL REGURGITATION   . Unspecified essential hypertension   . MITRAL VALVE PROLAPSE   . ESOPHAGEAL STRICTURE   . PUD   . HIATAL HERNIA   . OSTEOARTHRITIS   . GASTROINTESTINAL HEMORRHAGE, HX OF   . BENIGN PROSTATIC HYPERTROPHY, HX OF     ROS: Negative except as per HPI  BP 140/77  Pulse 56  Ht 5\' 8"  (1.727 m)   Wt 169 lb (76.658 kg)  BMI 25.70 kg/m2  PHYSICAL EXAM: Pt is alert and oriented, pleasant elderly gentleman in NAD.  HEENT: normal Neck: JVP - normal, carotids 2+= without bruits Lungs: CTA bilaterally CV: RRR without murmur or gallop Abd: soft, NT, Positive BS, no hepatomegaly Ext: no C/C/E, distal pulses intact and equal Skin: warm/dry no rash  EKG:  Sinus bradycardia with first degree AV block, heart rate 56 beats per minute, left ventricular hypertrophy with nonspecific ST abnormality is noted.  ASSESSMENT AND PLAN:

## 2010-12-19 NOTE — Assessment & Plan Note (Signed)
The patient continues to do very well following mitral valve repair. He is approaching 2 years out from surgery and his physical exam is normal. He has class I symptoms and will continue with clinical followup. I would like to see him back in 6 months. I am going to refer him back to Dr. Debby Bud for further evaluation and treatment of his progressive memory problem.

## 2010-12-19 NOTE — Assessment & Plan Note (Signed)
The patient's blood pressure is controlled. He will continue with amlodipine, lisinopril, hydrochlorothiazide, and metoprolol.

## 2010-12-19 NOTE — Patient Instructions (Addendum)
Your physician wants you to follow-up in: 6 months with Dr Excell Seltzer. You will receive a reminder letter in the mail two months in advance. If you don't receive a letter, please call our office to schedule the follow-up appointment.   Get a follow up with Dr Debby Bud for your primary care

## 2011-01-27 ENCOUNTER — Encounter: Payer: Self-pay | Admitting: Internal Medicine

## 2011-01-29 ENCOUNTER — Ambulatory Visit: Payer: Medicare PPO | Admitting: Internal Medicine

## 2011-01-29 ENCOUNTER — Other Ambulatory Visit (INDEPENDENT_AMBULATORY_CARE_PROVIDER_SITE_OTHER): Payer: Medicare PPO

## 2011-01-29 ENCOUNTER — Encounter: Payer: Self-pay | Admitting: Internal Medicine

## 2011-01-29 ENCOUNTER — Other Ambulatory Visit: Payer: Self-pay | Admitting: Internal Medicine

## 2011-01-29 DIAGNOSIS — I10 Essential (primary) hypertension: Secondary | ICD-10-CM

## 2011-01-29 DIAGNOSIS — I059 Rheumatic mitral valve disease, unspecified: Secondary | ICD-10-CM

## 2011-01-29 DIAGNOSIS — E785 Hyperlipidemia, unspecified: Secondary | ICD-10-CM

## 2011-01-29 DIAGNOSIS — K222 Esophageal obstruction: Secondary | ICD-10-CM

## 2011-01-29 DIAGNOSIS — Z0000 Encounter for general adult medical examination without abnormal findings: Secondary | ICD-10-CM

## 2011-01-29 DIAGNOSIS — K279 Peptic ulcer, site unspecified, unspecified as acute or chronic, without hemorrhage or perforation: Secondary | ICD-10-CM

## 2011-01-29 LAB — CBC WITH DIFFERENTIAL/PLATELET
Basophils Absolute: 0 10*3/uL (ref 0.0–0.1)
Basophils Relative: 0.6 % (ref 0.0–3.0)
Eosinophils Absolute: 0.1 10*3/uL (ref 0.0–0.7)
MCHC: 33.8 g/dL (ref 30.0–36.0)
MCV: 98.3 fl (ref 78.0–100.0)
Monocytes Absolute: 0.5 10*3/uL (ref 0.1–1.0)
Neutrophils Relative %: 57 % (ref 43.0–77.0)
Platelets: 175 10*3/uL (ref 150.0–400.0)
RDW: 13.1 % (ref 11.5–14.6)

## 2011-01-29 LAB — COMPREHENSIVE METABOLIC PANEL
Albumin: 4.8 g/dL (ref 3.5–5.2)
BUN: 22 mg/dL (ref 6–23)
Calcium: 9.5 mg/dL (ref 8.4–10.5)
Chloride: 99 mEq/L (ref 96–112)
Creatinine, Ser: 1.2 mg/dL (ref 0.4–1.5)
GFR: 77.52 mL/min (ref >60.00)
Glucose, Bld: 95 mg/dL (ref 70–99)
Potassium: 3.5 mEq/L (ref 3.5–5.1)

## 2011-01-29 LAB — T4, FREE: Free T4: 0.69 ng/dL (ref 0.60–1.60)

## 2011-01-29 LAB — LIPID PANEL
HDL: 70.7 mg/dL (ref >39.00)
VLDL: 14 mg/dL (ref 0.0–40.0)

## 2011-01-29 LAB — HEPATIC FUNCTION PANEL
Alkaline Phosphatase: 45 U/L (ref 39–117)
Bilirubin, Direct: 0.3 mg/dL (ref 0.0–0.3)
Total Bilirubin: 2 mg/dL — ABNORMAL HIGH (ref 0.3–1.2)

## 2011-01-29 LAB — LDL CHOLESTEROL, DIRECT: Direct LDL: 142.6 mg/dL

## 2011-01-29 LAB — IBC PANEL: Iron: 94 ug/dL (ref 42–165)

## 2011-01-29 NOTE — Progress Notes (Signed)
Subjective:    Patient ID: Chad Delacruz, male    DOB: 1926-12-03, 77 y.o.   MRN: [id number]  HPI    The patient is here for annual Medicare wellness examination and management of other chronic and acute problems.   The risk factors are reflected in the social history.  The roster of all physicians providing medical care to patient - is listed in the Snapshot section of the chart.  Activities of daily living:  The patient is 100% inedpendent in all ADLs: dressing, toileting, feeding as well as independent mobility  Home safety : The patient has smoke detectors in the home. They wear seatbelts. firearms are present in the home, kept in a safe fashion. There is no violence in the home. No falls reported or increased fall risk.   There is no risks for hepatitis, STDs or HIV. There is no   history of blood transfusion. They have no travel history to infectious disease endemic areas of the world.  The patient has not seen their dentist in the last six month. They have seen their eye doctor in the last year. They admit to hearing difficulty and have not had audiologic testing in the last year.  They do not  have excessive sun exposure. Discussed the need for sun protection: hats, long sleeves and use of sunscreen if there is significant sun exposure.   Diet: the importance of a healthy diet is discussed. They do have a healthy diet.  The patient has a regular exercise program: [facility] , [facility]  The benefits of regular aerobic exercise were discussed.  Depression screen: there are no signs or vegative symptoms of depression- irritability, change in appetite, anhedonia, sadness/tearfullness.  Cognitive assessment: the patient's wife manages all their financial and personal affairs. He is actively engaged, i.e. [facility] [employer]. They could not relate day,date,year. Names president.  recalled 0/3 objects at 3 minutes; could not performed clock-face test.  The  following portions of the patient's history were reviewed and updated as appropriate: allergies, current medications, past family history, past medical history,  past surgical history, past social history  and problem list.  Vision, hearing, body mass index were assessed and reviewed.   During the course of the visit the patient was educated and counseled about appropriate screening and preventive services including : fall prevention , diabetes screening, nutrition counseling, colorectal cancer screening, and recommended immunizations.  Past Medical History  Diagnosis Date  . HYPERLIPIDEMIA   . MITRAL REGURGITATION   . Unspecified essential hypertension   . MITRAL VALVE PROLAPSE   . ESOPHAGEAL STRICTURE   . PUD   . HIATAL HERNIA   . OSTEOARTHRITIS   . GASTROINTESTINAL HEMORRHAGE, HX OF   . BENIGN PROSTATIC HYPERTROPHY, HX OF    Past Surgical History  Procedure Date  . Hemorrhoid surgery   . Circumcision   . Mitral valve repair 2009   No family history on file. History   Social History  . Marital Status: Married    Spouse Name: N/A    Number of Children: N/A  . Years of Education: N/A   Occupational History  . Not on file.   Social History Main Topics  . Smoking status: Never Smoker   . Smokeless tobacco: Never Used  . Alcohol Use: No  . Drug Use: No  . Sexually Active: Not Currently   Other Topics Concern  . Not on file   Social History Narrative  11th grade. Married 1950 - life sentence. 3 dtrs - '49, '50, '52; 2 sons - '51, '75. He had children by three women. 4 grandchildren. 1 great-grand.   He previously worked for [employer] 25 years, retired from that. Worked as a Nutritional therapist but totally retired in 2008. End of Life care - wishes full resuscitation and intensive treatment to include mechanical ventilation. Lives alone with wife and is independent in ADLs. Very limited in activity: no yard work.       Review of Systems Review of Systems    Constitutional:  Negative for fever, chills, activity change and unexpected weight change.  HEENT:  Positive for hearing loss. Negative for ear pain, congestion, neck stiffness and postnasal drip. Negative for sore throat or swallowing problems. Negative for dental complaints.   Eyes: Negative for vision loss or change in visual acuity.  Respiratory: Negative for chest tightness and wheezing.   Cardiovascular: Negative for chest pain and palpitation. No decreased exercise tolerance Gastrointestinal: No change in bowel habit - deoes have occasional loose stools. No bloating or gas. No reflux or indigestion Genitourinary: Negative for urgency, frequency, flank pain and difficulty urinating.  Musculoskeletal: Negative for myalgias, back pain, arthralgias and gait problem.  Neurological: Negative for dizziness, tremors, weakness and headaches.  Hematological: Negative for adenopathy.  Psychiatric/Behavioral: Negative for behavioral problems and dysphoric mood.     Objective:   Physical Exam Vitals noted Gen'l - WNWD AA male in no distress HEENT - no signs of trauma or injury, normal facial symmetry. Poor dentition with many missing teeth. C&S clear. Pupils round and reactive Neck- supple Nodes - no enlarged nodes Chest - well healed surgical scar. No deformity Lungs - good breath sounds, no rales or whezzes Cor - 2+ radial pulse, regular rate, II/VI feint systolic murmur at apex. No bruits Abdomen - soft Extremities without deformity Neuro - Alert, oriented to person, place and content. CN II-XII grossly normal. Speech clear. Remote memory normal. See HPI - poor word recall, could not do clock face. Normal unassisted gait. Normal motor strength Derm- no suspicious lesion on head, neck arms.  Lab Results  Component Value Date   WBC 6.6 01/29/2011   HGB 14.4 01/29/2011   HCT 42.7 01/29/2011   PLT 175.0 01/29/2011   CHOL 217* 01/29/2011   TRIG 70.0 01/29/2011   HDL 70.70 01/29/2011    LDLDIRECT 142.6 01/29/2011   ALT 23 01/29/2011   ALT 23 01/29/2011   AST 22 01/29/2011   AST 22 01/29/2011   NA 140 01/29/2011   K 3.5 01/29/2011   CL 99 01/29/2011   CREATININE 1.2 01/29/2011   BUN 22 01/29/2011   CO2 31 01/29/2011   TSH 1.488 *Test methodology is 3rd generation TSH* 04/02/2009   INR 1.39 05/07/2009   HGBA1C  Value: 5.4 (NOTE) The ADA recommends the following therapeutic goal for glycemic control related to Hgb A1c measurement: Goal of therapy: <6.5 Hgb A1c  Reference: American Diabetes Association: Clinical Practice Recommendations 2010, Diabetes Care, 2010, 33: (Suppl  1). 05/03/2009              Assessment & Plan:

## 2011-01-31 ENCOUNTER — Encounter: Payer: Self-pay | Admitting: Internal Medicine

## 2011-01-31 DIAGNOSIS — Z0000 Encounter for general adult medical examination without abnormal findings: Secondary | ICD-10-CM | POA: Insufficient documentation

## 2011-01-31 NOTE — Assessment & Plan Note (Signed)
Interval history since last PCP visit detailed in PMH. Recently he has been fine, making a good recovery from his surgery and resuming normal activity. He has retired by does go to [employer] on a regular basis. He is current with his cardiologist. Limited physical exam to day is unremarkable. Lab results from today are unremarkable except for elevated LDL cholesterol. His A1C is in normal range as are his other lab values. No recent colonoscopy in [facility] but at 53 with no bowel problems he does not require routine screening. Immunizations: he is a candidate for Tdap and shingles vaccine. He had pneumonia vaccine in 1995 and was just 67.   In summary- a very nice man who returns after a long hiatus for routine primary care. He does appear medically stable. He is advised to check insurance coverage for shingles vaccine.  He will continue all his present medications and if taking lipitor as instructed may need additional medication. He will return as need or in 6 months.

## 2011-01-31 NOTE — Assessment & Plan Note (Signed)
BP Readings from Last 3 Encounters:  01/29/11 120/70  12/19/10 140/77  05/15/10 155/73   OK control today and better than previous two readings.  Plan - continue present medications.

## 2011-01-31 NOTE — Assessment & Plan Note (Signed)
LDL has gone up from 83 to 146. He is supposed to be taking lipitor 80 mg daily but question adherence given the rise in LDL value.  Plan - check with the patient on medication adherence            If taking medications as directed may need to add zetia to get to goal.

## 2011-01-31 NOTE — Assessment & Plan Note (Signed)
S/p mitral valve repair and doing well.  Plan - per cardiology and vascular surgery

## 2011-01-31 NOTE — Assessment & Plan Note (Signed)
No active complaints at this time

## 2011-02-09 MED ORDER — EZETIMIBE 10 MG PO TABS: 10.0000 mg | ORAL_TABLET | Freq: Every day | ORAL | Status: DC

## 2011-02-16 NOTE — Progress Notes (Signed)
Subjective:    Patient ID: Chad Delacruz, male    DOB: [birth year], 77 y.o.   MRN: [id number]  HPI Patient present to reestablish.  Past Medical History  Diagnosis Date  . HYPERLIPIDEMIA   . MITRAL REGURGITATION   . Unspecified essential hypertension   . MITRAL VALVE PROLAPSE   . ESOPHAGEAL STRICTURE   . PUD   . HIATAL HERNIA   . OSTEOARTHRITIS   . GASTROINTESTINAL HEMORRHAGE, HX OF   . BENIGN PROSTATIC HYPERTROPHY, HX OF    Past Surgical History  Procedure Date  . Hemorrhoid surgery   . Circumcision   . Mitral valve repair 2009   No family history on file. History   Social History  . Marital Status: Married    Spouse Name: N/A    Number of Children: N/A  . Years of Education: N/A   Occupational History  . Not on file.   Social History Main Topics  . Smoking status: Never Smoker   . Smokeless tobacco: Never Used  . Alcohol Use: No  . Drug Use: No  . Sexually Active: Not Currently   Other Topics Concern  . Not on file   Social History Narrative   11th grade. Married 1950 - life sentence. 3 dtrs - '49, '50, '52; 2 sons - '51, '75. He had children by three women. 4 grandchildren. 1 great-grand.   He previously worked for [employer] 25 years, retired from that. Worked as a Nutritional therapist but totally retired in 2011. End of Life care - wishes full resuscitation and intensive treatment to include mechanical ventilation. Lives alone with wife and is independent in ADLs. Very limited in activity: no yard work.        Review of Systems     Objective:   Physical Exam Vitals noted PUlmonary- no increased work of breathing Cor - RRR       Assessment & Plan:   HYPERLIPIDEMIA LDL has gone up from 83 to 146. He is supposed to be taking lipitor 80 mg daily but question adherence given the rise in LDL value.  Plan - check with the patient on medication adherence            If taking medications as directed may need to add zetia to get to goal.    Unspecified Essential Hypertension BP Readings from Last 3 Encounters:  01/29/11 120/70  12/19/10 140/77  05/15/10 155/73   OK control today and better than previous two readings.  Plan - continue present medications.   MITRAL VALVE PROLAPSE S/p mitral valve repair and doing well.  Plan - per cardiology and vascular surgery  PUD No active complaints at this time.   Routine general medical examination at a health care facility Interval history since last PCP visit detailed in PMH. Recently he has been fine, making a good recovery from his surgery and resuming normal activity. He has retired by does go to [employer] on a regular basis. He is current with his cardiologist. Limited physical exam to day is unremarkable. Lab results from today are unremarkable except for elevated LDL cholesterol. His A1C is in normal range as are his other lab values. No recent colonoscopy in [facility] but at 89 with no bowel problems he does not require routine screening. Immunizations: he is a candidate for Tdap and shingles vaccine. He had pneumonia vaccine in 1995 and was just 67.   In summary- a very nice man who returns after a long hiatus for routine primary care.  He does appear medically stable. He is advised to check insurance coverage for shingles vaccine.  He will continue all his present medications and if taking lipitor as instructed may need additional medication. He will return as need or in 6 months.

## 2011-03-31 ENCOUNTER — Other Ambulatory Visit: Payer: Self-pay | Admitting: Cardiovascular Disease

## 2011-03-31 DIAGNOSIS — I10 Essential (primary) hypertension: Secondary | ICD-10-CM

## 2011-03-31 MED ORDER — LISINOPRIL-HYDROCHLOROTHIAZIDE 20-25 MG PO TABS: 1.0000 | ORAL_TABLET | Freq: Every day | ORAL | Status: DC

## 2011-03-31 MED ORDER — AMLODIPINE BESYLATE 5 MG PO TABS: 5.0000 mg | ORAL_TABLET | Freq: Every day | ORAL | Status: DC

## 2011-03-31 MED ORDER — METOPROLOL TARTRATE 25 MG PO TABS: 25.0000 mg | ORAL_TABLET | Freq: Two times a day (BID) | ORAL | Status: DC

## 2011-04-30 LAB — CROSSMATCH
ABO/RH(D): O POS
Antibody Screen: NEGATIVE

## 2011-04-30 LAB — DIFFERENTIAL
Basophils Absolute: 0
Basophils Relative: 0
Eosinophils Absolute: 0
Eosinophils Relative: 0
Lymphocytes Relative: 20
Lymphs Abs: 1.5
Monocytes Absolute: 0.5
Monocytes Relative: 7
Neutro Abs: 5.3
Neutrophils Relative %: 72

## 2011-04-30 LAB — CBC
MCV: 94.3
Platelets: 211
WBC: 7.4

## 2011-04-30 LAB — COMPREHENSIVE METABOLIC PANEL
AST: 20
Albumin: 3.8
Chloride: 107
Creatinine, Ser: 1.31
GFR calc Af Amer: >60
Total Bilirubin: 0.8

## 2011-04-30 LAB — PROTIME-INR: Prothrombin Time: 14.2

## 2011-04-30 LAB — HEMOGLOBIN AND HEMATOCRIT, BLOOD
HCT: 23.1 — ABNORMAL LOW
Hemoglobin: 7.7 — CL

## 2011-04-30 LAB — ABO/RH: ABO/RH(D): O POS

## 2011-07-30 ENCOUNTER — Ambulatory Visit (INDEPENDENT_AMBULATORY_CARE_PROVIDER_SITE_OTHER): Payer: Medicare Other | Admitting: Internal Medicine

## 2011-07-30 DIAGNOSIS — F0390 Unspecified dementia without behavioral disturbance: Secondary | ICD-10-CM

## 2011-07-30 DIAGNOSIS — I10 Essential (primary) hypertension: Secondary | ICD-10-CM

## 2011-07-30 DIAGNOSIS — J069 Acute upper respiratory infection, unspecified: Secondary | ICD-10-CM

## 2011-07-30 MED ORDER — MEMANTINE HCL 10 MG PO TABS: 10.0000 mg | ORAL_TABLET | Freq: Two times a day (BID) | ORAL | Status: DC

## 2011-07-30 NOTE — Progress Notes (Signed)
  Subjective:    Patient ID: Chad Delacruz, male    DOB: June 11, 1927, 78 y.o.   MRN: [id number]  HPI Chad Delacruz presents with his daughter for evaluation of URI and for evaluation for dementia. He has not had a fever, he has a cough without sputum, no SOB. Chad Delacruz does admit to some memory loss. His daughter reports nocturnal awakening, time disorientation and wandering behavior.  Past Medical History  Diagnosis Date  . HYPERLIPIDEMIA   . MITRAL REGURGITATION   . Unspecified essential hypertension   . MITRAL VALVE PROLAPSE   . ESOPHAGEAL STRICTURE   . PUD   . HIATAL HERNIA   . OSTEOARTHRITIS   . GASTROINTESTINAL HEMORRHAGE, HX OF   . BENIGN PROSTATIC HYPERTROPHY, HX OF    Past Surgical History  Procedure Date  . Hemorrhoid surgery   . Circumcision   . Mitral valve repair 2009   No family history on file. History   Social History  . Marital Status: Married    Spouse Name: N/A    Number of Children: N/A  . Years of Education: N/A   Occupational History  . Not on file.   Social History Main Topics  . Smoking status: Never Smoker   . Smokeless tobacco: Never Used  . Alcohol Use: No  . Drug Use: No  . Sexually Active: Not Currently   Other Topics Concern  . Not on file   Social History Narrative   11th grade. Married 1950 - life sentence. 3 dtrs - '49, '50, '52; 2 sons - '51, '75. He had children by three women. 4 grandchildren. 1 great-grand.   He previously worked for [employer] 25 years, retired from that. Worked as a Nutritional therapist but totally retired in 2011. End of Life care - wishes full resuscitation and intensive treatment to include mechanical ventilation. Lives alone with wife and is independent in ADLs. Very limited in activity: no yard work.         Review of Systems System review is negative for any constitutional, cardiac, pulmonary, GI or neuro symptoms or complaints other than as described in the HPI.     Objective:   Physical Exam Filed  Vitals:   07/30/11 1353  BP: 132/84  Pulse: 80  Temp: 99.1 F (37.3 C)   Gen'l- older AA man in no distress who is pleasant and cooperative/ HEENT - C&S muddy, Pulm- normal respirations; lungs are clear  Cor - RRR Neuro - A&O x 3, CN II-XII appear to be normal. MMSE: 1. Day,date,year - no, no, 1954, last holiday no 2. Content: president- no   Gov. -   Current events - no  3. Number repitition: 5 fwd - 1 error    5 rev - no        World reversed - skipped 4. 3 word recall - 0/0 5. Serial 7's - no     , nickles in $1.25-  No  Change making - no 6. Naming objects -  ok         4 legged creatures - ok 7. Parables:  [place] - ok      Rolling stone -  no 8. Judgement:  Letter  poor     Fire 9. Clock face exercise- poor performance        Assessment & Plan:  URI - no evidence of a abacterial infection Plan - supportive care.

## 2011-07-30 NOTE — Patient Instructions (Signed)
Cough and low grade fever is consistent with a viral cold. THe lung exam is normal - no sign of pneumonia Plan - robitussin DM 1 tsp every 4 hours as needed for cough           Hydrate           Vitamin C  Memory - on the MMSE test you have real issues with memory.  Plan - Namenda - take the starter pak as directed, then pick up the prescription for maintenance dosing.           Read the paper, do puzzles, make lists, use a calendar to help your memory.           For problems of disorientation to time of day or other signs of confusion please let me know.    Dementia Dementia is a general term for problems with brain function. A person with dementia has memory loss and a hard time with at least one other brain function such as thinking, speaking, or problem solving. Dementia can affect social functioning, how you do your job, your mood, or your personality. The changes may be hidden for a long time. The earliest forms of this disease are usually not detected by family or friends. Dementia can be:  Irreversible.     Potentially reversible.     Partially reversible.     Progressive. This means it can get worse over time.  CAUSES   Irreversible dementia causes may include:  Degeneration of brain cells (Alzheimer's disease or lewy body dementia).     Multiple small strokes (vascular dementia).     Infection (chronic meningitis or Creutzfelt-Jakob disease).     Frontotemporal dementia. This affects younger people, age 30 to 29, compared to those who have Alzheimer's disease.     Dementia associated with other disorders like Parkinson's disease, Huntington's disease, or HIV-associated dementia.  Potentially or partially reversible dementia causes may include:  Medicines.     Metabolic causes such as excessive alcohol intake, vitamin B12 deficiency, or thyroid disease.     Masses or pressure in the brain such as a tumor, blood clot, or hydrocephalus.  SYMPTOMS   Symptoms are often  hard to detect. Family members or coworkers may not notice them early in the disease process. Different people with dementia may have different symptoms. Symptoms can include:  A hard time with memory, especially recent memory. Long-term memory may not be impaired.     Asking the same question multiple times or forgetting something someone just said.     A hard time speaking your thoughts or finding certain words.     A hard time solving problems or performing familiar tasks (such as how to use a telephone).     Sudden changes in mood.     Changes in personality, especially increasing moodiness or mistrust.     Depression.    A hard time understanding complex ideas that were never a problem in the past.  DIAGNOSIS   There are no specific tests for dementia.    Your caregiver may recommend a thorough evaluation. This is because some forms of dementia can be reversible. The evaluation will likely include a physical exam and getting a detailed history from you and a family member. The history often gives the best clues and suggestions for a diagnosis.     Memory testing may be done. A detailed brain function evaluation called neuropsychologic testing may be helpful.     Lab tests  and brain imaging (such as a CT scan or MRI scan) are sometimes important.     Sometimes observation and re-evaluation over time is very helpful.  TREATMENT   Treatment depends on the cause.    If the problem is a vitamin deficiency, it may be helped or cured with supplements.     For dementias such as Alzheimer's disease, medicines are available to stabilize or slow the course of the disease. There are no cures for this type of dementia.     Your caregiver can help direct you to groups, organizations, and other caregivers to help with decisions in the care of you or your loved one.  HOME CARE INSTRUCTIONS The care of individuals with dementia is varied and dependent upon the progression of the dementia. The  following suggestions are intended for the person living with, or caring for, the person with dementia.  Create a safe environment.     Remove the locks on bathroom doors to prevent the person from accidentally locking himself or herself in.     Use childproof latches on kitchen cabinets and any place where cleaning supplies, chemicals, or alcohol are kept.     Use childproof covers in unused electrical outlets.     Install childproof devices to keep doors and windows secured.     Remove stove knobs or install safety knobs and an automatic shut-off on the stove.     Lower the temperature on water heaters.     Label medicines and keep them locked up.     Secure knives, lighters, matches, power tools, and guns, and keep these items out of reach.     Keep the house free from clutter. Remove rugs or anything that might contribute to a fall.     Remove objects that might break and hurt the person.     Make sure lighting is good, both inside and outside.     Install grab rails as needed.     Use a monitoring device to alert you to falls or other needs for help.     Reduce confusion.     Keep familiar objects and people around.     Use night lights or dim lights at night.     Label items or areas.     Use reminders, notes, or directions for daily activities or tasks.     Keep a simple, consistent routine for waking, meals, bathing, dressing, and bedtime.     Create a calm, quiet environment.     Place large clocks and calendars prominently.     Display emergency numbers and home address near all telephones.     Use cues to establish different times of the day. An example is to open curtains to let the natural light in during the day.       Use effective communication.     Choose simple words and short sentences.     Use a gentle, calm tone of voice.     Be careful not to interrupt.     If the person is struggling to find a word or communicate a thought, try to  provide the word or thought.     Ask one question at a time. Allow the person ample time to answer questions. Repeat the question again if the person does not respond.     Reduce nighttime restlessness.     Provide a comfortable bed.     Have a consistent nighttime routine.  Ensure a regular walking or physical activity schedule. Involve the person in daily activities as much as possible.     Limit napping during the day.     Limit caffeine.     Attend social events that stimulate rather than overwhelm the senses.     Encourage good nutrition and hydration.     Reduce distractions during meal times and snacks.     Avoid foods that are too hot or too cold.     Monitor chewing and swallowing ability.     Continue with routine vision, hearing, dental, and medical screenings.     Only give over-the-counter or prescription medicines as directed by the caregiver.     Monitor driving abilities. Do not allow the person to drive when safe driving is no longer possible.     Register with an identification program which could provide location assistance in the event of a missing person situation.  SEEK MEDICAL CARE IF:    New behavioral problems start such as moodiness, aggressiveness, or seeing things that are not there (hallucinations).     Any new problem with brain function happens. This includes problems with balance, speech, or falling a lot.     Problems with swallowing develop.     Any symptoms of other illness happen.  Small changes or worsening in any aspect of brain function can be a sign that the illness is getting worse. It can also be a sign of another medical illness such as infection. Seeing a caregiver right away is important. SEEK IMMEDIATE MEDICAL CARE IF:    A fever develops.     New or worsened confusion develops.     New or worsened sleepiness develops.     Staying awake becomes hard to do.  Document Released: 12/30/2000 Document Revised: 03/18/2011  Document Reviewed: 12/01/2010 [facility] Patient Information 2012 [place], [place].

## 2011-08-02 DIAGNOSIS — F0390 Unspecified dementia without behavioral disturbance: Secondary | ICD-10-CM | POA: Insufficient documentation

## 2011-08-02 NOTE — Assessment & Plan Note (Signed)
BP Readings from Last 3 Encounters:  07/30/11 132/84  01/29/11 120/70  12/19/10 140/77   Adequate control.

## 2011-08-02 NOTE — Assessment & Plan Note (Signed)
Patient with definite memory and cognitive deficiencies. On MMSE. He has had some wandering behavior at home.  Plan - Start Namenda started pak.           ROV 6-8 weeks           Family to call for any progressive sundowning behavior.

## 2011-08-03 ENCOUNTER — Telehealth: Payer: Self-pay | Admitting: *Deleted

## 2011-08-03 DIAGNOSIS — R4689 Other symptoms and signs involving appearance and behavior: Secondary | ICD-10-CM

## 2011-08-03 MED ORDER — HALOPERIDOL 1 MG PO TABS: ORAL_TABLET | ORAL | Status: DC

## 2011-08-03 NOTE — Telephone Encounter (Signed)
Spoke w/ daughter: very abrupt behavior change: aggressive, disoriented, not driving.   Plan - haloperiodol 1 ,mg q hs           CT brain - r/o tumor or lesion.

## 2011-08-03 NOTE — Telephone Encounter (Signed)
Pt's daughter left msg concerned about what to do with her father's behavior. Pt was seen last Thursday and was prescribed meds for dimentia. Pt's daughter states that his behavior has become very bad. Does pt need an OV? Please advise.

## 2011-08-05 ENCOUNTER — Other Ambulatory Visit (INDEPENDENT_AMBULATORY_CARE_PROVIDER_SITE_OTHER): Payer: Medicare Other

## 2011-08-05 ENCOUNTER — Other Ambulatory Visit: Payer: Self-pay | Admitting: *Deleted

## 2011-08-05 ENCOUNTER — Ambulatory Visit (INDEPENDENT_AMBULATORY_CARE_PROVIDER_SITE_OTHER)
Admission: RE | Admit: 2011-08-05 | Discharge: 2011-08-05 | Disposition: A | Discharge status: Home / Self Care | Payer: Medicare Other | Source: Ambulatory Visit | Attending: Internal Medicine | Admitting: Internal Medicine

## 2011-08-05 DIAGNOSIS — R4689 Other symptoms and signs involving appearance and behavior: Secondary | ICD-10-CM

## 2011-08-05 DIAGNOSIS — F919 Conduct disorder, unspecified: Secondary | ICD-10-CM

## 2011-08-05 LAB — COMPREHENSIVE METABOLIC PANEL
Albumin: 4.3 g/dL (ref 3.5–5.2)
BUN: 16 mg/dL (ref 6–23)
Calcium: 9.4 mg/dL (ref 8.4–10.5)
Chloride: 103 mEq/L (ref 96–112)
Creatinine, Ser: 1.3 mg/dL (ref 0.4–1.5)
GFR: 70.38 mL/min (ref >60.00)
Glucose, Bld: 99 mg/dL (ref 70–99)
Potassium: 3.6 mEq/L (ref 3.5–5.1)

## 2011-08-05 MED ORDER — IOHEXOL 300 MG/ML  SOLN: 80.0000 mL | Freq: Once | INTRAMUSCULAR | Status: AC | PRN

## 2011-08-09 ENCOUNTER — Encounter: Payer: Self-pay | Admitting: Internal Medicine

## 2011-08-29 ENCOUNTER — Other Ambulatory Visit: Payer: Self-pay | Admitting: Cardiovascular Disease

## 2011-08-31 NOTE — Telephone Encounter (Signed)
Refilled atorvastatin one time

## 2011-09-30 ENCOUNTER — Other Ambulatory Visit: Payer: Self-pay | Admitting: Cardiovascular Disease

## 2011-10-26 ENCOUNTER — Encounter: Payer: Self-pay | Admitting: Internal Medicine

## 2011-10-26 ENCOUNTER — Ambulatory Visit (INDEPENDENT_AMBULATORY_CARE_PROVIDER_SITE_OTHER): Payer: Medicare Other | Admitting: Internal Medicine

## 2011-10-26 VITALS — BP 88/58 | HR 71 | Temp 97.5°F | Resp 16 | Wt 175.2 lb

## 2011-10-26 DIAGNOSIS — F0390 Unspecified dementia without behavioral disturbance: Secondary | ICD-10-CM

## 2011-10-26 DIAGNOSIS — I10 Essential (primary) hypertension: Secondary | ICD-10-CM

## 2011-10-26 NOTE — Patient Instructions (Signed)
Blood pressure - really low today but has been higher in the past. Plan - STOP lisinopril/hct; Continue lopressor and amlodipine.            Get a home BP monitor so that this can be followed more closely: check BP 3 times a week. Call for low BP (90 or less on top) or high BP (150 or higher on top).  Cholesterol - continue the lipitor (atorvastatin) and the zetia ( ezetimibe)  Memory and "sun-downing" behavior - increase the haloparidol to 2 mg at bedtime ( 2 tablets)

## 2011-10-26 NOTE — Progress Notes (Signed)
Subjective:    Patient ID: Chad Delacruz, male    DOB: 12-Jun-1927, 78 y.o.   MRN: [id number]  HPI Chad Delacruz presents for follow-up. His daughter reports worsening sun-downing with wandering and hallucinations. He has driven off and has gotten lost. He has also been very lethargic and draggy. His BP is 88/58 today. The family does not monitor at home. Reviewed all his medications today. He is on 4 medications for BP control. His family has arranged for adult day care.   Past Medical History  Diagnosis Date  . HYPERLIPIDEMIA   . MITRAL REGURGITATION   . Unspecified essential hypertension   . MITRAL VALVE PROLAPSE   . ESOPHAGEAL STRICTURE   . PUD   . HIATAL HERNIA   . OSTEOARTHRITIS   . GASTROINTESTINAL HEMORRHAGE, HX OF   . BENIGN PROSTATIC HYPERTROPHY, HX OF    Past Surgical History  Procedure Date  . Hemorrhoid surgery   . Circumcision   . Mitral valve repair 2009   No family history on file. History   Social History  . Marital Status: Married    Spouse Name: N/A    Number of Children: N/A  . Years of Education: N/A   Occupational History  . Not on file.   Social History Main Topics  . Smoking status: Never Smoker   . Smokeless tobacco: Never Used  . Alcohol Use: No  . Drug Use: No  . Sexually Active: Not Currently   Other Topics Concern  . Not on file   Social History Narrative   11th grade. Married 1950 - life sentence. 3 dtrs - '49, '50, '52; 2 sons - '51, '75. He had children by three women. 4 grandchildren. 1 great-grand.   He previously worked for [employer] 25 years, retired from that. Worked as a Nutritional therapist but totally retired in 2011. End of Life care - wishes full resuscitation and intensive treatment to include mechanical ventilation. Lives alone with wife and is independent in ADLs. Very limited in activity: no yard work.     Current Outpatient Prescriptions on File Prior to Visit  Medication Sig Dispense Refill  . acetaminophen (PAIN  RELIEF) 500 MG tablet Take 500 mg by mouth every 6 (six) hours as needed.        Marland Kitchen amLODipine (NORVASC) 5 MG tablet Take 1 tablet (5 mg total) by mouth daily.  30 tablet  11  . atorvastatin (LIPITOR) 80 MG tablet TAKE 1 TABLET DAILY  30 tablet  4  . ezetimibe (ZETIA) 10 MG tablet Take 1 tablet (10 mg total) by mouth daily.  30 tablet  11  . haloperidol (HALDOL) 1 MG tablet Take one tablet at bedtime. FOr daytime disruptive behavior may take additional dose in AM  30 tablet  3  . lisinopril-hydrochlorothiazide (PRINZIDE,ZESTORETIC) 20-25 MG per tablet Take 1 tablet by mouth daily.  30 tablet  11  . memantine (NAMENDA) 10 MG tablet Take 1 tablet (10 mg total) by mouth 2 (two) times daily.  60 tablet  11  . metoprolol tartrate (LOPRESSOR) 25 MG tablet Take 1 tablet (25 mg total) by mouth 2 (two) times daily.  60 tablet  11  . Multiple Vitamin (MULTIVITAMIN) tablet Take 1 tablet by mouth daily.            Review of Systems System review is negative for any constitutional, cardiac, pulmonary, GI or neuro symptoms or complaints other than as described in the HPI.     Objective:  Physical Exam Filed Vitals:   10/26/11 1324  BP: 88/58  Pulse: 71  Temp: 97.5 F (36.4 C)  Resp: 16   BP Readings from Last 3 Encounters:  10/26/11 88/58  07/30/11 132/84  01/29/11 120/70   Gen'l - WNWD AA man who is in no distress HEENT- C&S clear Cor - 1_+ radial pulse, RRR Pulm - normal respirations Neuro - awake, speech clear, poor memory and does not recall night time behavior.       Assessment & Plan:

## 2011-10-27 NOTE — Assessment & Plan Note (Signed)
Progressive dementia with more wandering, some behavior issues and sun-downing.  Plan - provided daughter with some information from UpToDate           Daughter reports there is a meeting set up with an educator about dementia           Increase Haldol to 2 mg qHS

## 2011-10-27 NOTE — Assessment & Plan Note (Signed)
BP very low today but the patient is asymptomatic.  Plan - D/C linsinopril/Hct           Daughter encouraged to purchase home BP monitor and for him to have BP check 3/wk.

## 2011-10-28 ENCOUNTER — Ambulatory Visit: Payer: Medicare Other | Admitting: Internal Medicine

## 2011-11-10 ENCOUNTER — Other Ambulatory Visit: Payer: Self-pay | Admitting: Internal Medicine

## 2011-11-16 ENCOUNTER — Telehealth: Payer: Self-pay | Admitting: *Deleted

## 2011-11-16 MED ORDER — HALOPERIDOL 1 MG PO TABS: 2.0000 mg | ORAL_TABLET | Freq: Every day | ORAL | Status: DC

## 2011-11-16 NOTE — Telephone Encounter (Signed)
Ms, Bogden called late Friday and came by this am requesting a refill on Haloperidol 1mg  tablet. Tycho Cheramie has run out since the increase in med to 2 tablets at bedtime instead of the one tablet on the last office visit. Fannie Knee 11/16/2011

## 2011-11-16 NOTE — Telephone Encounter (Signed)
Rx modified and new Rx eScribed to pharmacy

## 2011-11-27 ENCOUNTER — Other Ambulatory Visit: Payer: Self-pay

## 2011-11-27 MED ORDER — LISINOPRIL-HYDROCHLOROTHIAZIDE 20-12.5 MG PO TABS: 1.0000 | ORAL_TABLET | Freq: Every day | ORAL | Status: DC

## 2011-11-27 NOTE — Telephone Encounter (Signed)
..   Requested Prescriptions   Signed Prescriptions Disp Refills  . lisinopril-hydrochlorothiazide (PRINZIDE,ZESTORETIC) 20-12.5 MG per tablet 30 tablet 1    Sig: Take 1 tablet by mouth daily.    Authorizing Provider: Tonny Bollman    Ordering User: Christella Hartigan, Rontrell Moquin M  Patient was on this med per note on 12/19/11 and patient needs to call office to make appointment

## 2011-11-30 ENCOUNTER — Other Ambulatory Visit: Payer: Self-pay | Admitting: Cardiovascular Disease

## 2011-11-30 DIAGNOSIS — I10 Essential (primary) hypertension: Secondary | ICD-10-CM

## 2011-11-30 MED ORDER — METOPROLOL TARTRATE 25 MG PO TABS: 25.0000 mg | ORAL_TABLET | Freq: Two times a day (BID) | ORAL | Status: DC

## 2011-11-30 MED ORDER — LISINOPRIL-HYDROCHLOROTHIAZIDE 20-12.5 MG PO TABS: 1.0000 | ORAL_TABLET | Freq: Every day | ORAL | Status: DC

## 2011-11-30 MED ORDER — AMLODIPINE BESYLATE 5 MG PO TABS: 5.0000 mg | ORAL_TABLET | Freq: Every day | ORAL | Status: DC

## 2011-11-30 MED ORDER — ATORVASTATIN CALCIUM 80 MG PO TABS: 80.0000 mg | ORAL_TABLET | Freq: Every day | ORAL | Status: DC

## 2011-12-02 ENCOUNTER — Encounter: Payer: Self-pay | Admitting: Internal Medicine

## 2011-12-02 ENCOUNTER — Ambulatory Visit (INDEPENDENT_AMBULATORY_CARE_PROVIDER_SITE_OTHER): Payer: Medicare Other | Admitting: Internal Medicine

## 2011-12-02 VITALS — BP 152/90 | HR 80 | Temp 97.5°F | Resp 16 | Wt 182.0 lb

## 2011-12-02 DIAGNOSIS — F0390 Unspecified dementia without behavioral disturbance: Secondary | ICD-10-CM

## 2011-12-02 DIAGNOSIS — I10 Essential (primary) hypertension: Secondary | ICD-10-CM

## 2011-12-02 MED ORDER — FUROSEMIDE 20 MG PO TABS: 20.0000 mg | ORAL_TABLET | Freq: Every day | ORAL | Status: DC

## 2011-12-02 NOTE — Progress Notes (Signed)
  Subjective:    Patient ID: Chad Delacruz, male    DOB: [birth year], 78 y.o.   MRN: [id number]  HPI Chad Delacruz presents for evaluation of pedal edema which is a new problem. He is followed for HTN and at his last visit due to low BP lisinopril/hct was stopped. His pressure is now a little high. He has felt and done better with is BP higher. He has had no chest pain or discomfort, no SOB, DOE.  PMH, FamHx and SocHx reviewed for any changes and relevance.    Review of Systems System review is negative for any constitutional, cardiac, pulmonary, GI or neuro symptoms or complaints other than as described in the HPI.     Objective:   Physical Exam Filed Vitals:   12/02/11 1621  BP: 152/90  Pulse: 80  Temp: 97.5 F (36.4 C)  Resp: 16   Wt Readings from Last 3 Encounters:  12/02/11 182 lb (82.555 kg)  10/26/11 175 lb 4 oz (79.493 kg)  07/30/11 176 lb (79.833 kg)   WNWD AA man in no distress Cor - 2+ radial pulse, RRR, 2+ pitting pedal edema Pulm - normal respirations without rales or wheezes.       Assessment & Plan:  Peripheral edema - more likely to be venous insufficiency than heart or kidney related. Provided info on venous insufficiency

## 2011-12-02 NOTE — Patient Instructions (Signed)
Blood pressure - has drifted up a little bit - too much. Plan continue amlodipine and metoprolol. Add furosemide 20 mg once a day -a diuretic that will help bring down the BP but too much. Continue to monitor BP and let me know if it too high or too low.  Swelling in the legs - this may be some fluid retention. Chart was reviewed and last lab work in Jan '13 revealed normal kidney function. There may be a component of venous insufficiency - see handout below. Plan - start furosemide as above.   Venous Stasis and Chronic Venous Insufficiency As people age, the veins located in their legs may weaken and stretch. When veins weaken and lose the ability to pump blood effectively, the condition is called chronic venous insufficiency (CVI) or venous stasis. Almost all veins return blood back to the heart. This happens by:  The force of the heart pumping fresh blood pushes blood back to the heart.   Blood flowing to the heart from the force of gravity.  In the deep veins of the legs, blood has to fight gravity and flow upstream back to the heart. Here, the leg muscles contract to pump blood back toward the heart. Vein walls are elastic, and many veins have small valves that only allow blood to flow in one direction. When leg muscles contract, they push inward against the elastic vein walls. This squeezes blood upward, opens the valves, and moves blood toward the heart. When leg muscles relax, the vein wall also relaxes and the valves inside the vein close to prevent blood from flowing backward. This method of pumping blood out of the legs is called the venous pump. CAUSES   The venous pump works best while walking and leg muscles are contracting. But when a person sits or stands, blood pressure in leg veins can build. Deep veins are usually able to withstand short periods of inactivity, but long periods of inactivity (and increased pressure) can stretch, weaken, and damage vein walls. High blood pressure can  also stretch and damage vein walls. The veins may no longer be able to pump blood back to the heart. Venous hypertension (high blood pressure inside veins) that lasts over time is a primary cause of CVI. CVI can also be caused by:    Deep vein thrombosis, a condition where a thrombus (blood clot) blocks blood flow in a vein.   Phlebitis, an inflammation of a superficial vein that causes a blood clot to form.  Other risk factors for CVI may include:    Heredity.   Obesity.   Pregnancy.   Sedentary lifestyle.   Smoking.   Jobs requiring long periods of standing or sitting in one place.   Age and gender:   Women in their 28's and 66's and men in their 6's are more prone to developing CVI.  SYMPTOMS   Symptoms of CVI may include:    Varicose veins.   Ulceration or skin breakdown.   Lipodermatosclerosis, a condition that affects the skin just above the ankle, usually on the inside surface.  Over time the skin becomes brown, smooth, tight and often painful. Those with this condition have a high risk of developing skin ulcers.   Reddened or discolored skin on the leg.   Swelling.  DIAGNOSIS   Your caregiver can diagnose CVI after performing a careful medical history and physical examination. To confirm the diagnosis, the following tests may also be ordered:    Duplex ultrasound.   Plethysmography (  tests blood flow).   Venograms (x-ray using a special dye).  TREATMENT The goals of treatment for CVI are to restore a person to an active life and to minimize pain or disability. Typically, CVI does not pose a serious threat to life or limb, and with proper treatment most people with this condition can continue to lead active lives. In most cases, mild CVI can be treated on an outpatient basis with simple procedures. Treatment methods include:    Elastic compression socks.   Sclerotherapy, a procedure involving an injection of a material that "dissolves" the damaged veins. Other  veins in the network of blood vessels take over the function of the damaged veins.   Vein stripping (an older procedure less commonly used).   Laser Ablation surgery.   Valve repair.  HOME CARE INSTRUCTIONS    Elastic compression socks must be worn every day. They can help with symptoms and lower the chances of the problem getting worse, but they do not cure the problem.   Only take over-the-counter or prescription medicines for pain, discomfort, or fever as directed by your caregiver.   Your caregiver will review your other medications with you.  SEEK MEDICAL CARE IF:    You are confused about how to take your medications.   There is redness, swelling, or increasing pain in the affected area.   There is a red streak or line that extends up or down from the affected area.   There is a breakdown or loss of skin in the affected area, even if the breakdown is small.   You develop an unexplained oral temperature above 102 F (38.9 C).   There is an injury to the affected area.  SEEK IMMEDIATE MEDICAL CARE IF:    There is an injury and open wound to the affected area.   Pain is not adequately relieved with pain medication prescribed or becomes severe.   An oral temperature above 102 F (38.9 C) develops.   The foot/ankle below the affected area becomes suddenly numb or the area feels weak and hard to move.  MAKE SURE YOU:    Understand these instructions.   Will watch your condition.   Will get help right away if you are not doing well or get worse.  Document Released: 11/09/2006 Document Revised: 06/25/2011 Document Reviewed: 01/17/2007 [facility] Patient Information 2012 [place], [place].

## 2011-12-02 NOTE — Assessment & Plan Note (Signed)
BP Readings from Last 3 Encounters:  12/02/11 152/90  10/26/11 88/58  07/30/11 132/84   Plan  Continue amlodipine and metoprolol  Add furosemide 20 mg daily

## 2011-12-02 NOTE — Assessment & Plan Note (Signed)
Doing better. Attending adult day care

## 2012-01-20 ENCOUNTER — Other Ambulatory Visit (INDEPENDENT_AMBULATORY_CARE_PROVIDER_SITE_OTHER): Payer: Medicare Other

## 2012-01-20 ENCOUNTER — Encounter: Payer: Self-pay | Admitting: Internal Medicine

## 2012-01-20 ENCOUNTER — Ambulatory Visit (INDEPENDENT_AMBULATORY_CARE_PROVIDER_SITE_OTHER): Payer: Medicare Other | Admitting: Internal Medicine

## 2012-01-20 VITALS — BP 118/72 | HR 74 | Temp 97.4°F | Resp 16 | Wt 171.0 lb

## 2012-01-20 DIAGNOSIS — E785 Hyperlipidemia, unspecified: Secondary | ICD-10-CM

## 2012-01-20 DIAGNOSIS — I10 Essential (primary) hypertension: Secondary | ICD-10-CM

## 2012-01-20 DIAGNOSIS — R10815 Periumbilic abdominal tenderness: Secondary | ICD-10-CM | POA: Insufficient documentation

## 2012-01-20 DIAGNOSIS — K4090 Unilateral inguinal hernia, without obstruction or gangrene, not specified as recurrent: Secondary | ICD-10-CM

## 2012-01-20 LAB — COMPREHENSIVE METABOLIC PANEL
Albumin: 4.5 g/dL (ref 3.5–5.2)
Alkaline Phosphatase: 53 U/L (ref 39–117)
Chloride: 99 mEq/L (ref 96–112)
Glucose, Bld: 102 mg/dL — ABNORMAL HIGH (ref 70–99)
Potassium: 3.5 mEq/L (ref 3.5–5.1)
Sodium: 138 mEq/L (ref 135–145)
Total Protein: 7.3 g/dL (ref 6.0–8.3)

## 2012-01-20 LAB — CBC WITH DIFFERENTIAL/PLATELET
Eosinophils Relative: 0.8 % (ref 0.0–5.0)
Lymphocytes Relative: 28.7 % (ref 12.0–46.0)
Monocytes Absolute: 0.5 10*3/uL (ref 0.1–1.0)
Monocytes Relative: 7.8 % (ref 3.0–12.0)
Neutrophils Relative %: 62.4 % (ref 43.0–77.0)
Platelets: 195 10*3/uL (ref 150.0–400.0)
WBC: 6.6 10*3/uL (ref 4.5–10.5)

## 2012-01-20 LAB — LIPID PANEL
LDL Cholesterol: 60 mg/dL (ref 0–99)
Total CHOL/HDL Ratio: 2
VLDL: 11.6 mg/dL (ref 0.0–40.0)

## 2012-01-20 LAB — URINALYSIS, ROUTINE W REFLEX MICROSCOPIC
Ketones, ur: NEGATIVE
Specific Gravity, Urine: 1.01 (ref 1.000–1.030)
Urine Glucose: NEGATIVE
Urobilinogen, UA: 0.2 (ref 0.0–1.0)
pH: 6 (ref 5.0–8.0)

## 2012-01-20 LAB — AMYLASE: Amylase: 92 U/L (ref 27–131)

## 2012-01-20 LAB — LIPASE: Lipase: 26 U/L (ref 11.0–59.0)

## 2012-01-20 NOTE — Patient Instructions (Signed)

## 2012-01-20 NOTE — Assessment & Plan Note (Signed)
I will check his lipids today 

## 2012-01-20 NOTE — Progress Notes (Signed)
Subjective:    Patient ID: Chad Delacruz, male    DOB: [birth year], 78 y.o.   MRN: [id number]  Abdominal Pain This is a new problem. The current episode started 1 to 4 weeks ago. The onset quality is gradual. The problem occurs intermittently. The most recent episode lasted 2 weeks. The problem has been unchanged. The pain is located in the suprapubic region. The pain is at a severity of 2/10. The pain is mild. The quality of the pain is dull. The abdominal pain does not radiate. Associated symptoms include nausea and weight loss. Pertinent negatives include no anorexia, arthralgias, belching, constipation, diarrhea, dysuria, fever, flatus, frequency, headaches, hematochezia, hematuria, melena, myalgias or vomiting. Nothing aggravates the pain. The pain is relieved by nothing. He has tried nothing for the symptoms.      Review of Systems  Constitutional: Positive for weight loss and unexpected weight change (11 pound weight loss). Negative for fever, diaphoresis, activity change, appetite change and fatigue.  HENT: Negative.   Eyes: Negative.   Respiratory: Negative for cough, chest tightness, shortness of breath, wheezing and stridor.   Cardiovascular: Negative for chest pain, palpitations and leg swelling.  Gastrointestinal: Positive for nausea and abdominal pain. Negative for vomiting, diarrhea, constipation, blood in stool, melena, hematochezia, abdominal distention, anal bleeding, rectal pain, anorexia and flatus.  Genitourinary: Negative.  Negative for dysuria, frequency and hematuria.  Musculoskeletal: Negative for myalgias, back pain, joint swelling, arthralgias and gait problem.  Skin: Negative for color change, pallor, rash and wound.  Neurological: Negative.  Negative for headaches.  Hematological: Negative for adenopathy. Does not bruise/bleed easily.  Psychiatric/Behavioral: Negative.        Objective:   Physical Exam  Vitals reviewed. Constitutional: He appears  well-developed and well-nourished. No distress.  HENT:  Head: Normocephalic and atraumatic.  Mouth/Throat: Oropharynx is clear and moist. No oropharyngeal exudate.  Eyes: Conjunctivae are normal. Right eye exhibits no discharge. Left eye exhibits no discharge. No scleral icterus.  Neck: Normal range of motion. Neck supple. No JVD present. No tracheal deviation present. No thyromegaly present.  Cardiovascular: Normal rate, regular rhythm, normal heart sounds and intact distal pulses.  Exam reveals no gallop and no friction rub.   No murmur heard. Pulmonary/Chest: Effort normal and breath sounds normal. No stridor. No respiratory distress. He has no wheezes. He has no rales. He exhibits no tenderness.  Abdominal: Soft. Bowel sounds are normal. He exhibits no distension and no mass. There is no tenderness. There is no rebound and no guarding. A hernia is present. Hernia confirmed positive in the right inguinal area (very small and easily reduced) and confirmed positive in the left inguinal area (moderate size and easily redued).  Genitourinary: Rectum normal and penis normal. Rectal exam shows no external hemorrhoid, no internal hemorrhoid, no fissure, no mass, no tenderness and anal tone normal. Guaiac negative stool. Prostate is enlarged (smooth B BPH with no nodules). Prostate is not tender. Right testis shows no mass, no swelling and no tenderness. Right testis is descended. Left testis shows no mass, no swelling and no tenderness. Left testis is descended. Uncircumcised. No phimosis, paraphimosis, hypospadias, penile erythema or penile tenderness. No discharge found.  Lymphadenopathy:    He has no cervical adenopathy.       Right: No inguinal adenopathy present.       Left: No inguinal adenopathy present.  Skin: He is not diaphoretic.  Psychiatric: He has a normal mood and affect. Judgment and thought content normal. His mood appears not  anxious. His affect is not angry, not blunt, not labile and  not inappropriate. His speech is delayed and tangential. His speech is not rapid and/or pressured and not slurred. He is slowed and withdrawn. He is not agitated, not aggressive, is not hyperactive, not actively hallucinating and not combative. Thought content is not paranoid and not delusional. Cognition and memory are impaired. He does not express impulsivity or inappropriate judgment. He does not exhibit a depressed mood. He expresses no homicidal and no suicidal ideation. He expresses no suicidal plans and no homicidal plans. He is communicative. He exhibits abnormal recent memory and abnormal remote memory. He is inattentive.          Assessment & Plan:

## 2012-01-20 NOTE — Assessment & Plan Note (Signed)
His BP is well controlled, I will check his lytes and renal function today 

## 2012-01-20 NOTE — Assessment & Plan Note (Signed)
I think his pain is coming from the inguinal hernias so I have referred him to surgery, he also has nausea so I will check labs today to look for pancreatitis, hepatitis, kidney stones, renal failure, etc.

## 2012-01-20 NOTE — Assessment & Plan Note (Signed)
General surgery referral 

## 2012-01-27 ENCOUNTER — Encounter: Payer: Self-pay | Admitting: Cardiovascular Disease

## 2012-01-28 ENCOUNTER — Other Ambulatory Visit: Payer: Self-pay

## 2012-01-28 DIAGNOSIS — I080 Rheumatic disorders of both mitral and aortic valves: Secondary | ICD-10-CM

## 2012-02-02 ENCOUNTER — Ambulatory Visit (HOSPITAL_COMMUNITY): Payer: Medicare Other | Attending: Cardiovascular Disease

## 2012-02-02 DIAGNOSIS — I10 Essential (primary) hypertension: Secondary | ICD-10-CM | POA: Insufficient documentation

## 2012-02-02 DIAGNOSIS — I080 Rheumatic disorders of both mitral and aortic valves: Secondary | ICD-10-CM

## 2012-02-02 DIAGNOSIS — E785 Hyperlipidemia, unspecified: Secondary | ICD-10-CM | POA: Insufficient documentation

## 2012-02-02 DIAGNOSIS — I059 Rheumatic mitral valve disease, unspecified: Secondary | ICD-10-CM

## 2012-02-02 DIAGNOSIS — I359 Nonrheumatic aortic valve disorder, unspecified: Secondary | ICD-10-CM | POA: Insufficient documentation

## 2012-02-02 NOTE — Progress Notes (Signed)
Echocardiogram performed.  

## 2012-02-10 ENCOUNTER — Telehealth: Payer: Self-pay | Admitting: Physician Assistant

## 2012-02-10 ENCOUNTER — Ambulatory Visit (INDEPENDENT_AMBULATORY_CARE_PROVIDER_SITE_OTHER): Payer: Medicare Other | Admitting: Physician Assistant

## 2012-02-10 ENCOUNTER — Encounter: Payer: Self-pay | Admitting: Physician Assistant

## 2012-02-10 VITALS — Ht 68.0 in | Wt 174.8 lb

## 2012-02-10 DIAGNOSIS — I080 Rheumatic disorders of both mitral and aortic valves: Secondary | ICD-10-CM

## 2012-02-10 DIAGNOSIS — Z01818 Encounter for other preprocedural examination: Secondary | ICD-10-CM

## 2012-02-10 DIAGNOSIS — I10 Essential (primary) hypertension: Secondary | ICD-10-CM

## 2012-02-10 NOTE — Assessment & Plan Note (Signed)
Blood pressure is stable 

## 2012-02-10 NOTE — Progress Notes (Signed)
HPI:  This is an 78 year old African American male patient who is here today accompanied by his daughter for preoperative surgical clearance before undergoing left hernia repair, scheduled for 03/19/12 by Dr. Michaell Cowing.. He has a history of mitral valve repair in October 2010 for long-standing severe mitral regurgitation secondary to myxomatous mitral valve disease. At that time cardiac catheterization showed diffuse nonobstructive coronary artery disease. He recently had 2-D echo on 02/02/12 that showed mildly reduced systolic function EF 45-50% with hypokinesis of the inferoseptal myocardium. Doppler parameters are consistent with abnormal left ventricular relaxation, grade 1 diastolic dysfunction, mild to moderate aortic regurgitation, moderately to severely dilated left atrium. Mitral valve repair with trivial regurgitation.  The patient denies any cardiac complaints of chest pain, palpitations, dyspnea, dizziness, or presyncope. His daughter states that he has been sleeping a lot more recently and seems to be more fatigued. Labs by Dr. Debby Bud including a TSH were all normal.   Allergies:  -- Aspirin    --  REACTION: BLEEDING  Current Outpatient Prescriptions on File Prior to Visit: acetaminophen (PAIN RELIEF) 500 MG tablet, Take 500 mg by mouth every 6 (six) hours as needed.  , Disp: , Rfl:  amLODipine (NORVASC) 5 MG tablet, Take 1 tablet (5 mg total) by mouth daily., Disp: 90 tablet, Rfl: 3 atorvastatin (LIPITOR) 80 MG tablet, Take 1 tablet (80 mg total) by mouth daily., Disp: 90 tablet, Rfl: 3 furosemide (LASIX) 20 MG tablet, Take 1 tablet (20 mg total) by mouth daily., Disp: 30 tablet, Rfl: 11 haloperidol (HALDOL) 1 MG tablet, Take 2 tablets (2 mg total) by mouth at bedtime., Disp: 60 tablet, Rfl: 5 lisinopril-hydrochlorothiazide (PRINZIDE,ZESTORETIC) 20-12.5 MG per tablet, Take 1 tablet by mouth daily., Disp: 90 tablet, Rfl: 3 memantine (NAMENDA) 10 MG tablet, Take 1 tablet (10 mg total) by mouth  2 (two) times daily., Disp: 60 tablet, Rfl: 11 metoprolol tartrate (LOPRESSOR) 25 MG tablet, Take 1 tablet (25 mg total) by mouth 2 (two) times daily., Disp: 180 tablet, Rfl: 3 Multiple Vitamin (MULTIVITAMIN) tablet, Take 1 tablet by mouth daily.  , Disp: , Rfl:     Past Medical History:   HYPERLIPIDEMIA                                               MITRAL REGURGITATION                                         Unspecified essential hypertension                           MITRAL VALVE PROLAPSE                                        ESOPHAGEAL STRICTURE                                         PUD  HIATAL HERNIA                                                OSTEOARTHRITIS                                               GASTROINTESTINAL HEMORRHAGE, HX OF                           BENIGN PROSTATIC HYPERTROPHY, HX OF                         Past Surgical History:   HEMORRHOID SURGERY                                           CIRCUMCISION                                                 MITRAL VALVE REPAIR                             05/07/2009  No family history on file.   Social History   Marital Status: Married             Spouse Name:                      Years of Education:                 Number of children:             Occupational History   None on file  Social History Main Topics   Smoking Status: Never Smoker                     Smokeless Status: Never Used                       Alcohol Use: No             Drug Use: No             Sexual Activity: Not Currently      Other Topics            Concern   None on file  Social History Narrative   11th grade. Married 1950 - life sentence. 3 dtrs - '49, '50, '52; 2 sons - '51, '75. He had children by three women. 4 grandchildren. 1 great-grand.   He previously worked for [employer] 25 years, retired from that. Worked as a Nutritional therapist but totally retired in 2011. End of  Life care - wishes full resuscitation and intensive treatment to include mechanical ventilation. Lives alone with wife and is independent in ADLs. Very limited in activity: no yard work.     ROS: See HPI Eyes: Negative Ears:positive for hearing loss,negative for tinnitus Cardiovascular: Negative for chest pain,  palpitations,irregular heartbeat, dyspnea, dyspnea on exertion, near-syncope, orthopnea, paroxysmal nocturnal dyspnea and syncope,edema, claudication, cyanosis,.  Respiratory:   Negative for cough, hemoptysis, shortness of breath, sleep disturbances due to breathing, sputum production and wheezing.   Endocrine: Negative for cold intolerance and heat intolerance. sleeping a lot and fatigue Hematologic/Lymphatic: Negative for adenopathy and bleeding problem. Does not bruise/bleed easily.  Musculoskeletal: arthritis.   Gastrointestinal: Negative for nausea, vomiting, reflux, abdominal pain, diarrhea, constipation.   Neurological: Negative.  Allergic/Immunologic: Negative for environmental allergies.   PHYSICAL EXAM: Well-nournished, in no acute distress. Neck: No JVD, HJR, Bruit, or thyroid enlargement  Lungs: No tachypnea, clear without wheezing, rales, or rhonchi  Cardiovascular: RRR, PMI not displaced,2 are 6 blowing murmur at the apex, no gallops, bruit, thrill, or heave.  Abdomen: BS normal. Soft without organomegaly, masses, lesions or tenderness.  Extremities: without cyanosis, clubbing or edema. Good distal pulses bilateral  SKin: Warm, no lesions or rashes   Musculoskeletal: No deformities  Neuro: no focal signs  Ht 5\' 8"  (1.727 m)  Wt 174 lb 12.8 oz (79.289 kg)  BMI 26.58 kg/m2   WUJ:WJXBJY sinus rhythm with first degree AV block,no change from EKG in 6/13  2Decho: 02/02/12: Study Conclusions   - Left ventricle: The cavity size was normal. There was moderate    focal basal and mild concentric hypertrophy. Systolic function was    mildly reduced. The  estimated ejection fraction was in the range    of 45% to 50%. Diffuse hypokinesis. Doppler parameters are    consistent with abnormal left ventricular relaxation (grade 1    diastolic dysfunction).  - Aortic valve: Mild regurgitation.  - Mitral valve: S/P ring with repair and trivial residual MR Prior    procedures included surgical repair. A 34mm Sorin-Puig-Messana    ring prosthesis was present and functioning normally. The sewing    ring appeared normal. Valve area by pressure half-time: 1.43cm^2.    Valve area by continuity equation (using LVOT flow): 1.39cm^2.  - Left atrium: The atrium was mildly dilated.  - Atrial septum: The septum bowed from left to right, consistent    with increased left atrial pressure. No defect or patent foramen    ovale was identified.   -------------------------------------------------------------------- 04/10/09: CORONARY ANGIOGRAPHY:  The right coronary artery is small and nondominant.  There is an acute marginal branch that arises from the proximal vessel and supplies the right ventricle.  There is no significant obstructive disease in the right coronary artery system.  Left coronary artery:  The left mainstem is moderately calcified.  There is no significant stenosis.  The mainstem bifurcates into the LAD and left circumflex.  LAD:  The LAD is patent and wraps around the apex.  There is diffuse plaquing throughout the proximal LAD with diffuse 20% stenosis.  The vessel supplies a large diagonal branch with no significant stenosis. There is diffuse nonobstructive plaque throughout.  There are no areas of high-grade stenosis.  The left circumflex has an intermediate branch without significant stenosis.  There is mild plaque in the intermediate branch with up to 20% stenosis.  The AV groove circumflex courses down and supplies a left PDA branch distally.  There are really no major OM branches.  The circumflex has an eccentric 40-50% ostial  stenosis.  There is no other significant stenosis throughout the circumflex.  ASSESSMENT: 1. Severe mitral regurgitation with preserved left ventricular     function. 2. Diffuse nonobstructive coronary artery disease as described. 3. Mildly elevated right heart  pressures, likely secondary to severe     mitral regurgitation.  RECOMMENDATIONS:  The patient's case has been reviewed with Dr. Cornelius Moras with [facility].  He will be evaluated as an outpatient for mitral valve repair or replacement via a mini thoracotomy approach.

## 2012-02-10 NOTE — Assessment & Plan Note (Signed)
Patient underwent mitral valve repair in 2010. 2-D echo shows stable mitral valve repair.

## 2012-02-10 NOTE — Assessment & Plan Note (Addendum)
This is an 78 year old male patient who had mitral valve repair in October 2010 for long-standing severe mitral regurgitation secondary to myxomatous mitral valve disease. At that time he had nonobstructive coronary artery disease. 2-D echo on 02/02/12 showed mildly reduced LV function ejection fraction 45-50% with hypokinesis of the inferoseptal myocardium. Mild to moderate aortic regurgitation, moderately to severely dilated left atrium, asymmetrical septal hypertrophy of the left ventricle, grade 1 diastolic dysfunction and mitral valve repair with trivial regurgitation. He is doing well without cardiac symptoms. I discussed this patient with Dr. Bonnee Quin who agrees the patient may proceed with left inguinal hernia repair next week.

## 2012-02-10 NOTE — Patient Instructions (Addendum)
You are cleared for surgery.  We will send notice to your surgeon.    Your physician recommends that you schedule a follow-up appointment in: 6 months with Dr. Excell Seltzer.

## 2012-02-16 ENCOUNTER — Telehealth: Payer: Self-pay | Admitting: Cardiovascular Disease

## 2012-02-17 ENCOUNTER — Ambulatory Visit (INDEPENDENT_AMBULATORY_CARE_PROVIDER_SITE_OTHER): Payer: Medicare Other | Admitting: Surgery

## 2012-02-17 ENCOUNTER — Other Ambulatory Visit: Payer: Self-pay | Admitting: Internal Medicine

## 2012-02-17 ENCOUNTER — Encounter (INDEPENDENT_AMBULATORY_CARE_PROVIDER_SITE_OTHER): Payer: Self-pay | Admitting: Surgery

## 2012-02-17 ENCOUNTER — Telehealth: Payer: Self-pay | Admitting: Internal Medicine

## 2012-02-17 VITALS — BP 140/70 | HR 68 | Temp 96.9°F | Resp 16 | Ht 70.0 in | Wt 170.2 lb

## 2012-02-17 DIAGNOSIS — N400 Enlarged prostate without lower urinary tract symptoms: Secondary | ICD-10-CM

## 2012-02-17 DIAGNOSIS — K4020 Bilateral inguinal hernia, without obstruction or gangrene, not specified as recurrent: Secondary | ICD-10-CM

## 2012-02-17 MED ORDER — TAMSULOSIN HCL 0.4 MG PO CAPS: 0.4000 mg | ORAL_CAPSULE | Freq: Every day | ORAL | Status: DC

## 2012-02-17 NOTE — Telephone Encounter (Signed)
error 

## 2012-02-17 NOTE — Progress Notes (Signed)
Subjective:     Patient ID: Chad Delacruz, male   DOB: August 26, 1926, 78 y.o.   MRN: [id number]  HPI  Chad Delacruz  05/08/1967 [id number]  Patient Care Team: Jacques Navy, MD as PCP - General Tonny Bollman, MD as Consulting Physician (Cardiology)  This patient is a 78 y.o.male who presents today for surgical evaluation at the request of Dr. Yetta Barre (for Dr. Debby Bud).   Reason for visit: Groin pain.  Probable inguinal hernia.  Patient is a pleasant elderly male with early dementia.  He comes today with his daughter.  He is had aortic valve repair for some vascular disease.  He is three years out from that.  He is relatively active.  Patient had an episode of groin pain.  It was rather sharp and concerning.  He saw his primary care physician.  There was concern of an inguinal hernia.  The patient was sent to [place] for evaluation.  Patient has occasional loose bowel movements but no severe constipation or diarrhea.  He can walk "a good ways" some mild fatigue.  Energy level down at times.  Otherwise pretty stable.  Rather functional.  Goes to church regularly.  Involved in family life.  Some memory loss but no major problems with the help of his daughter in his life.  No history of skin infection  Patient Active Problem List  Diagnosis  . HYPERLIPIDEMIA  . MITRAL REGURGITATION  . Unspecified essential hypertension  . MITRAL VALVE PROLAPSE  . ESOPHAGEAL STRICTURE  . PUD  . HIATAL HERNIA  . OSTEOARTHRITIS  . GASTROINTESTINAL HEMORRHAGE, HX OF  . BENIGN PROSTATIC HYPERTROPHY, HX OF  . Routine general medical examination at a health care facility  . Dementia  . Abdominal tenderness, periumbilic  . Inguinal hernia, left  . Preoperative clearance  . Bilateral inguinal hernia (BIH), L>R  . BPH (benign prostatic hyperplasia)    Past Medical History  Diagnosis Date  . HYPERLIPIDEMIA   . MITRAL REGURGITATION   . Unspecified essential hypertension   . MITRAL VALVE PROLAPSE   . ESOPHAGEAL  STRICTURE   . PUD   . HIATAL HERNIA   . OSTEOARTHRITIS   . GASTROINTESTINAL HEMORRHAGE, HX OF   . BENIGN PROSTATIC HYPERTROPHY, HX OF     Past Surgical History  Procedure Date  . Hemorrhoid surgery   . Circumcision   . Mitral valve repair 05/07/2009    Dr. Cornelius Moras    History   Social History  . Marital Status: Married    Spouse Name: N/A    Number of Children: N/A  . Years of Education: N/A   Occupational History  . Not on file.   Social History Main Topics  . Smoking status: Never Smoker   . Smokeless tobacco: Never Used  . Alcohol Use: No  . Drug Use: No  . Sexually Active: Not Currently   Other Topics Concern  . Not on file   Social History Narrative   11th grade. Married 1950 - life sentence. 3 dtrs - '49, '50, '52; 2 sons - '51, '75. He had children by three women. 4 grandchildren. 1 great-grand.   He previously worked for [employer] 25 years, retired from that. Worked as a Nutritional therapist but totally retired in 2011. End of Life care - wishes full resuscitation and intensive treatment to include mechanical ventilation. Lives alone with wife and is independent in ADLs. Very limited in activity: no yard work.     Family History  Problem Relation Age of  Onset  . Hypertension Mother   . Hypertension Father     Current Outpatient Prescriptions  Medication Sig Dispense Refill  . acetaminophen (PAIN RELIEF) 500 MG tablet Take 500 mg by mouth every 6 (six) hours as needed.        Marland Kitchen amLODipine (NORVASC) 5 MG tablet Take 1 tablet (5 mg total) by mouth daily.  90 tablet  3  . atorvastatin (LIPITOR) 80 MG tablet Take 1 tablet (80 mg total) by mouth daily.  90 tablet  3  . furosemide (LASIX) 20 MG tablet Take 1 tablet (20 mg total) by mouth daily.  30 tablet  11  . haloperidol (HALDOL) 1 MG tablet Take 2 tablets (2 mg total) by mouth at bedtime.  60 tablet  5  . lisinopril-hydrochlorothiazide (PRINZIDE,ZESTORETIC) 20-12.5 MG per tablet Take 1 tablet by mouth daily.  90  tablet  3  . memantine (NAMENDA) 10 MG tablet Take 1 tablet (10 mg total) by mouth 2 (two) times daily.  60 tablet  11  . metoprolol tartrate (LOPRESSOR) 25 MG tablet Take 1 tablet (25 mg total) by mouth 2 (two) times daily.  180 tablet  3  . Multiple Vitamin (MULTIVITAMIN) tablet Take 1 tablet by mouth daily.        . Tamsulosin HCl (FLOMAX) 0.4 MG CAPS Take 1 capsule (0.4 mg total) by mouth daily after supper.  20 capsule  1  . ZETIA 10 MG tablet TAKE 1 TABLET (10 MG TOTAL) BY MOUTH DAILY.  30 tablet  10     Allergies  Allergen Reactions  . Aspirin     REACTION: BLEEDING    BP 140/70  Pulse 68  Temp 96.9 F (36.1 C) (Temporal)  Resp 16  Ht 5\' 10"  (1.778 m)  Wt 170 lb 3.2 oz (77.202 kg)  BMI 24.42 kg/m2  No results found.   Review of Systems  Constitutional: Positive for fatigue. Negative for fever, chills, diaphoresis, appetite change and unexpected weight change.  HENT: Negative for nosebleeds, sore throat, facial swelling, mouth sores, trouble swallowing and ear discharge.   Eyes: Negative for photophobia, discharge and visual disturbance.  Respiratory: Negative for choking, chest tightness, shortness of breath and stridor.   Cardiovascular: Negative for chest pain and palpitations.  Gastrointestinal: Negative for nausea, vomiting, abdominal pain, diarrhea, constipation, blood in stool, abdominal distention, anal bleeding and rectal pain.  Genitourinary: Negative for dysuria, urgency, discharge, penile swelling, scrotal swelling, difficulty urinating and testicular pain.  Musculoskeletal: Positive for myalgias and arthralgias. Negative for back pain and gait problem.  Skin: Negative for color change, pallor, rash and wound.  Neurological: Negative for dizziness, speech difficulty, weakness, numbness and headaches.  Hematological: Negative for adenopathy. Does not bruise/bleed easily.  Psychiatric/Behavioral: Positive for confusion and decreased concentration. Negative for  hallucinations and agitation. The patient is not nervous/anxious.        Objective:   Physical Exam  Constitutional: He is oriented to person, place, and time. He appears well-developed and well-nourished. No distress.  HENT:  Head: Normocephalic.  Mouth/Throat: Oropharynx is clear and moist. No oropharyngeal exudate.  Eyes: Conjunctivae and EOM are normal. Pupils are equal, round, and reactive to light. No scleral icterus.  Neck: Normal range of motion. Neck supple. No tracheal deviation present.  Cardiovascular: Normal rate, regular rhythm and intact distal pulses.   Pulmonary/Chest: Effort normal and breath sounds normal. No respiratory distress.  Abdominal: Soft. He exhibits no distension. There is no tenderness. There is no CVA tenderness. A  hernia is present. Hernia confirmed positive in the right inguinal area and confirmed positive in the left inguinal area.  Musculoskeletal: Normal range of motion. He exhibits no tenderness.  Lymphadenopathy:    He has no cervical adenopathy.       Right: No inguinal adenopathy present.       Left: No inguinal adenopathy present.  Neurological: He is alert and oriented to person, place, and time. No cranial nerve deficit. He exhibits normal muscle tone. Coordination normal.  Skin: Skin is warm and dry. No rash noted. He is not diaphoretic. No erythema. No pallor.  Psychiatric: He has a normal mood and affect. His behavior is normal. Thought content normal. His mood appears not anxious. His affect is not angry. His speech is delayed. Thought content is not paranoid and not delusional. Cognition and memory are impaired. He exhibits abnormal recent memory.       Assessment:     Small but symptomatic inguinal hernias with an episode of sharp pain last month.  Early dementia but rather functional.    Plan:     Given the fact that he's been cleared by cardiology is rather functional and is medically stable, it is reasonable to consider repair.   Hives are pretty good he has several years of life and them with decent quality of life.  He's had an episode of sharp pain concern for possible risks of incarceration.  He and his daughter wish to be aggressive and are interested in surgery.  I did discuss laparoscopic bilateral inguinal hernia repair with mesh:  The anatomy & physiology of the abdominal wall and pelvic floor was discussed.  The pathophysiology of hernias in the inguinal and pelvic region was discussed.  Natural history risks such as progressive enlargement, pain, incarceration & strangulation was discussed.   Contributors to complications such as smoking, obesity, diabetes, prior surgery, etc were discussed.    I feel the risks of no intervention will lead to serious problems that outweigh the operative risks; therefore, I recommended surgery to reduce and repair the hernia.  I explained laparoscopic techniques with possible need for an open approach.  I noted usual use of mesh to patch and/or buttress hernia repair  Risks such as bleeding, infection, abscess, need for further treatment, heart attack, death, and other risks were discussed.  I noted a good likelihood this will help address the problem.   Goals of post-operative recovery were discussed as well.  Possibility that this will not correct all symptoms was explained.  I stressed the importance of low-impact activity, aggressive pain control, avoiding constipation, & not pushing through pain to minimize risk of post-operative chronic pain or injury. Possibility of reherniation was discussed.  We will work to minimize complications.     An educational handout further explaining the pathology & treatment options was given as well.  Questions were answered.  The patient expresses understanding & wishes to proceed with surgery.  He has an enlarged prostate.  No definite history of prostatism but hard to get a history from them on that.  Will give him perioperative Flomax just in  case

## 2012-02-17 NOTE — Patient Instructions (Addendum)
See the Handout(s) we gave you.  Consider surgery.   Start the tamulosin (Flomax) 10 days before surgery to help the prostate shrink & decrease the need for a catheter after surgery  Please call our office at [phone] if you wish to schedule surgery or if you have further questions / concerns.   Hernia A hernia occurs when an internal organ pushes out through a weak spot in the abdominal wall. Hernias most commonly occur in the groin and around the navel. Hernias often can be pushed back into place (reduced). Most hernias tend to get worse over time. Some abdominal hernias can get stuck in the opening (irreducible or incarcerated hernia) and cannot be reduced. An irreducible abdominal hernia which is tightly squeezed into the opening is at risk for impaired blood supply (strangulated hernia). A strangulated hernia is a medical emergency. Because of the risk for an irreducible or strangulated hernia, surgery may be recommended to repair a hernia. CAUSES   Heavy lifting.   Prolonged coughing.   Straining to have a bowel movement.   A cut (incision) made during an abdominal surgery.  HOME CARE INSTRUCTIONS   Bed rest is not required. You may continue your normal activities.   Avoid lifting more than 10 pounds (4.5 kg) or straining.   Cough gently. If you are a smoker it is best to stop. Even the best hernia repair can break down with the continual strain of coughing. Even if you do not have your hernia repaired, a cough will continue to aggravate the problem.   Do not wear anything tight over your hernia. Do not try to keep it in with an outside bandage or truss. These can damage abdominal contents if they are trapped within the hernia sac.   Eat a normal diet.   Avoid constipation. Straining over long periods of time will increase hernia size and encourage breakdown of repairs. If you cannot do this with diet alone, stool softeners may be used.  SEEK IMMEDIATE MEDICAL CARE IF:    You have a fever.   You develop increasing abdominal pain.   You feel nauseous or vomit.   Your hernia is stuck outside the abdomen, looks discolored, feels hard, or is tender.   You have any changes in your bowel habits or in the hernia that are unusual for you.   You have increased pain or swelling around the hernia.   You cannot push the hernia back in place by applying gentle pressure while lying down.  MAKE SURE YOU:   Understand these instructions.   Will watch your condition.   Will get help right away if you are not doing well or get worse.  Document Released: 07/06/2005 Document Revised: 06/25/2011 Document Reviewed: 02/23/2008 [facility] Patient Information 2012 [place], [place].

## 2012-02-17 NOTE — Telephone Encounter (Signed)
Caller: Lala Lund; PCP: Illene Regulus; CB#: [phone]; Call regarding Alzheimers; Has Not Slept in 2. Weeks;  "Can't sit still, constantly walking from room to room."  Emergent sx ruled out.  See in 24 hours per Sleep Disorders protocol.  Caller states unable to get to office today as he is at another appointment during the time of call.  Appt. with Dr. Debby Bud on 02/18/12 at 11:00 am per Sleep Disorders protocol. Home care for the interim and parameters for callback given.

## 2012-02-18 ENCOUNTER — Ambulatory Visit (INDEPENDENT_AMBULATORY_CARE_PROVIDER_SITE_OTHER): Payer: Medicare Other | Admitting: Internal Medicine

## 2012-02-18 ENCOUNTER — Encounter: Payer: Self-pay | Admitting: Internal Medicine

## 2012-02-18 VITALS — BP 118/76 | HR 100 | Temp 98.1°F | Resp 14 | Wt 171.0 lb

## 2012-02-18 DIAGNOSIS — F0390 Unspecified dementia without behavioral disturbance: Secondary | ICD-10-CM

## 2012-02-18 MED ORDER — CITALOPRAM HYDROBROMIDE 20 MG PO TABS: 20.0000 mg | ORAL_TABLET | Freq: Every day | ORAL | Status: DC

## 2012-02-18 NOTE — Assessment & Plan Note (Signed)
Increased agitation, restlessness and anxiety. He is on Haldol qHS, namenda. He has no evidence of extrapyramidal symptoms. Reviewed 12 Lead EKG from July 24th - QT 400 ms/QTc 444 ms. Discussed with cardiologist on duty re: QT prolongation - ok to add meds.   Plan Continue present medications  Add citalopram 20 mg once a day  ROV 1 month for follow-up EKG and med check.

## 2012-02-18 NOTE — Progress Notes (Signed)
Subjective:    Patient ID: Chad Delacruz, male    DOB: Jun 03, 1927, 78 y.o.   MRN: [id number]  HPI Mr. Lemelin presents for evaluation and treatment of increased anxiety, restlessness, lack of sleep and agitation. His wife is the primary historian. He is treated for dementia with psychotic features and is on Haldol. He admits to his symptoms and lack of sleep.  Past Medical History  Diagnosis Date  . HYPERLIPIDEMIA   . MITRAL REGURGITATION   . Unspecified essential hypertension   . MITRAL VALVE PROLAPSE   . ESOPHAGEAL STRICTURE   . PUD   . HIATAL HERNIA   . OSTEOARTHRITIS   . GASTROINTESTINAL HEMORRHAGE, HX OF   . BENIGN PROSTATIC HYPERTROPHY, HX OF    Past Surgical History  Procedure Date  . Hemorrhoid surgery   . Circumcision   . Mitral valve repair 05/07/2009    Dr. Cornelius Moras   Family History  Problem Relation Age of Onset  . Hypertension Mother   . Hypertension Father    History   Social History  . Marital Status: Married    Spouse Name: N/A    Number of Children: N/A  . Years of Education: N/A   Occupational History  . Not on file.   Social History Main Topics  . Smoking status: Never Smoker   . Smokeless tobacco: Never Used  . Alcohol Use: No  . Drug Use: No  . Sexually Active: Not Currently   Other Topics Concern  . Not on file   Social History Narrative   11th grade. Married 1950 - life sentence. 3 dtrs - '49, '50, '52; 2 sons - '51, '75. He had children by three women. 4 grandchildren. 1 great-grand.   He previously worked for [employer] 25 years, retired from that. Worked as a Nutritional therapist but totally retired in 2011. End of Life care - wishes full resuscitation and intensive treatment to include mechanical ventilation. Lives alone with wife and is independent in ADLs. Very limited in activity: no yard work.     Current Outpatient Prescriptions on File Prior to Visit  Medication Sig Dispense Refill  . acetaminophen (PAIN RELIEF) 500 MG tablet Take  500 mg by mouth every 6 (six) hours as needed.        Marland Kitchen amLODipine (NORVASC) 5 MG tablet Take 1 tablet (5 mg total) by mouth daily.  90 tablet  3  . atorvastatin (LIPITOR) 80 MG tablet Take 1 tablet (80 mg total) by mouth daily.  90 tablet  3  . furosemide (LASIX) 20 MG tablet Take 1 tablet (20 mg total) by mouth daily.  30 tablet  11  . haloperidol (HALDOL) 1 MG tablet Take 2 tablets (2 mg total) by mouth at bedtime.  60 tablet  5  . lisinopril-hydrochlorothiazide (PRINZIDE,ZESTORETIC) 20-12.5 MG per tablet Take 1 tablet by mouth daily.  90 tablet  3  . memantine (NAMENDA) 10 MG tablet Take 1 tablet (10 mg total) by mouth 2 (two) times daily.  60 tablet  11  . metoprolol tartrate (LOPRESSOR) 25 MG tablet Take 1 tablet (25 mg total) by mouth 2 (two) times daily.  180 tablet  3  . Multiple Vitamin (MULTIVITAMIN) tablet Take 1 tablet by mouth daily.        . Tamsulosin HCl (FLOMAX) 0.4 MG CAPS Take 1 capsule (0.4 mg total) by mouth daily after supper.  20 capsule  1  . ZETIA 10 MG tablet TAKE 1 TABLET (10 MG TOTAL) BY MOUTH  DAILY.  30 tablet  10  . citalopram (CELEXA) 20 MG tablet Take 1 tablet (20 mg total) by mouth daily.  30 tablet  11      Review of Systems System review is negative for any constitutional, cardiac, pulmonary, GI or neuro symptoms or complaints other than as described in the HPI.     Objective:   Physical Exam Filed Vitals:   02/18/12 1128  BP: 118/76  Pulse: 100  Temp: 98.1 F (36.7 C)  Resp: 14   Gen'l- Elderly AA man in no distress but with clenched hand. HEENT- C&S clear, EOMI w/o nystagmus Cor- RRR Pulm - normal respirations Neuro - awake, answers questions, speech is clear. Cerebellar - no cogwheeling, normal gait, no tremor, no involuntary face/mouth/tongue movements       Assessment & Plan:

## 2012-02-18 NOTE — Patient Instructions (Addendum)
Increased anxiety and agitation. Plan - start citalopram 20 mg once a day. Call if this is not helping or helping enough. Need to return in 4 weeks for a follow-up EKG.

## 2012-03-15 ENCOUNTER — Ambulatory Visit (INDEPENDENT_AMBULATORY_CARE_PROVIDER_SITE_OTHER): Payer: Medicare Other | Admitting: Internal Medicine

## 2012-03-15 ENCOUNTER — Encounter: Payer: Self-pay | Admitting: Internal Medicine

## 2012-03-15 VITALS — BP 104/60 | HR 62 | Temp 98.1°F | Resp 14 | Wt 181.0 lb

## 2012-03-15 DIAGNOSIS — Z136 Encounter for screening for cardiovascular disorders: Secondary | ICD-10-CM

## 2012-03-15 DIAGNOSIS — F0390 Unspecified dementia without behavioral disturbance: Secondary | ICD-10-CM

## 2012-03-15 DIAGNOSIS — I10 Essential (primary) hypertension: Secondary | ICD-10-CM

## 2012-03-15 NOTE — Assessment & Plan Note (Signed)
Doing well on namenda, low dose haldol and celexa. QT interval went from 424 to 414 - stable.  Plan - continue present medications.

## 2012-03-15 NOTE — Progress Notes (Signed)
  Subjective:    Patient ID: Chad Delacruz, male    DOB: 06-Apr-1927, 78 y.o.   MRN: [id number]  HPI Chad Delacruz presents for follow up. At the last visit he was very anxious and was started on celexa. He has done very well: he has been much less restless and anxious per his wife, who is present today. He has had no adverse effects.   PMH, FamHx and SocHx reviewed for any changes and relevance. Current Outpatient Prescriptions on File Prior to Visit  Medication Sig Dispense Refill  . acetaminophen (PAIN RELIEF) 500 MG tablet Take 500 mg by mouth every 6 (six) hours as needed.        Marland Kitchen amLODipine (NORVASC) 5 MG tablet Take 1 tablet (5 mg total) by mouth daily.  90 tablet  3  . atorvastatin (LIPITOR) 80 MG tablet Take 1 tablet (80 mg total) by mouth daily.  90 tablet  3  . citalopram (CELEXA) 20 MG tablet Take 1 tablet (20 mg total) by mouth daily.  30 tablet  11  . furosemide (LASIX) 20 MG tablet Take 1 tablet (20 mg total) by mouth daily.  30 tablet  11  . haloperidol (HALDOL) 1 MG tablet Take 2 tablets (2 mg total) by mouth at bedtime.  60 tablet  5  . lisinopril-hydrochlorothiazide (PRINZIDE,ZESTORETIC) 20-12.5 MG per tablet Take 1 tablet by mouth daily.  90 tablet  3  . memantine (NAMENDA) 10 MG tablet Take 1 tablet (10 mg total) by mouth 2 (two) times daily.  60 tablet  11  . metoprolol tartrate (LOPRESSOR) 25 MG tablet Take 1 tablet (25 mg total) by mouth 2 (two) times daily.  180 tablet  3  . Multiple Vitamin (MULTIVITAMIN) tablet Take 1 tablet by mouth daily.        . Tamsulosin HCl (FLOMAX) 0.4 MG CAPS Take 1 capsule (0.4 mg total) by mouth daily after supper.  20 capsule  1  . ZETIA 10 MG tablet TAKE 1 TABLET (10 MG TOTAL) BY MOUTH DAILY.  30 tablet  10      Review of Systems System review is negative for any constitutional, cardiac, pulmonary, GI or neuro symptoms or complaints other than as described in the HPI.     Objective:   Physical Exam Filed Vitals:   03/15/12 1054    BP: 104/60  Pulse: 62  Temp: 98.1 F (36.7 C)  Resp: 14   Gen';l- elderly AA man Cor - RRR Pulm - clear  12 lead NSR, QT 414 mmsec.       Assessment & Plan:

## 2012-03-16 NOTE — Assessment & Plan Note (Signed)
BP Readings from Last 3 Encounters:  03/15/12 104/60  02/18/12 118/76  02/17/12 140/70   Good control on present regimen

## 2012-03-20 HISTORY — PX: HERNIA REPAIR: SHX51

## 2012-03-28 ENCOUNTER — Telehealth: Payer: Self-pay

## 2012-03-28 NOTE — Telephone Encounter (Signed)
Pt's daughter called stating pt's BLE edema has not improved. Pt is scheduled to have surgery 09/12 and daughter is concerned that fluid may affect surgery, please advise.

## 2012-03-29 NOTE — Telephone Encounter (Signed)
P/t's daughter informed

## 2012-03-29 NOTE — Telephone Encounter (Signed)
LE edema should not interfere with any surgery that may be planned.

## 2012-03-31 ENCOUNTER — Ambulatory Visit: Payer: Medicare Other | Admitting: Internal Medicine

## 2012-03-31 DIAGNOSIS — K4020 Bilateral inguinal hernia, without obstruction or gangrene, not specified as recurrent: Secondary | ICD-10-CM

## 2012-03-31 HISTORY — PX: HERNIA REPAIR: SHX51

## 2012-04-08 ENCOUNTER — Encounter: Payer: Self-pay | Admitting: Endocrinology

## 2012-04-08 ENCOUNTER — Ambulatory Visit (INDEPENDENT_AMBULATORY_CARE_PROVIDER_SITE_OTHER): Payer: Medicare Other | Admitting: Endocrinology

## 2012-04-08 ENCOUNTER — Telehealth: Payer: Self-pay | Admitting: General Practice

## 2012-04-08 VITALS — BP 120/80 | HR 70 | Temp 98.9°F | Resp 16 | Wt 181.0 lb

## 2012-04-08 DIAGNOSIS — I10 Essential (primary) hypertension: Secondary | ICD-10-CM

## 2012-04-08 NOTE — Progress Notes (Signed)
Subjective:    Patient ID: Chad Delacruz, male    DOB: February 17, 1927, 78 y.o.   MRN: [id number]  HPI Pt states a few weeks of moderate swelling at both legs, but no assoc sob Past Medical History  Diagnosis Date  . HYPERLIPIDEMIA   . MITRAL REGURGITATION   . Unspecified essential hypertension   . MITRAL VALVE PROLAPSE   . ESOPHAGEAL STRICTURE   . PUD   . HIATAL HERNIA   . OSTEOARTHRITIS   . GASTROINTESTINAL HEMORRHAGE, HX OF   . BENIGN PROSTATIC HYPERTROPHY, HX OF     Past Surgical History  Procedure Date  . Hemorrhoid surgery   . Circumcision   . Mitral valve repair 05/07/2009    Dr. Cornelius Moras    History   Social History  . Marital Status: Married    Spouse Name: N/A    Number of Children: N/A  . Years of Education: N/A   Occupational History  . Not on file.   Social History Main Topics  . Smoking status: Never Smoker   . Smokeless tobacco: Never Used  . Alcohol Use: No  . Drug Use: No  . Sexually Active: Not Currently   Other Topics Concern  . Not on file   Social History Narrative   11th grade. Married 1950 - life sentence. 3 dtrs - '49, '50, '52; 2 sons - '51, '75. He had children by three women. 4 grandchildren. 1 great-grand.   He previously worked for [employer] 25 years, retired from that. Worked as a Nutritional therapist but totally retired in 2011. End of Life care - wishes full resuscitation and intensive treatment to include mechanical ventilation. Lives alone with wife and is independent in ADLs. Very limited in activity: no yard work.     Current Outpatient Prescriptions on File Prior to Visit  Medication Sig Dispense Refill  . acetaminophen (PAIN RELIEF) 500 MG tablet Take 500 mg by mouth every 6 (six) hours as needed.        Marland Kitchen atorvastatin (LIPITOR) 80 MG tablet Take 1 tablet (80 mg total) by mouth daily.  90 tablet  3  . citalopram (CELEXA) 20 MG tablet Take 1 tablet (20 mg total) by mouth daily.  30 tablet  11  . furosemide (LASIX) 20 MG tablet Take 1  tablet (20 mg total) by mouth daily.  30 tablet  11  . haloperidol (HALDOL) 1 MG tablet Take 2 tablets (2 mg total) by mouth at bedtime.  60 tablet  5  . lisinopril-hydrochlorothiazide (PRINZIDE,ZESTORETIC) 20-12.5 MG per tablet Take 1 tablet by mouth daily.  90 tablet  3  . memantine (NAMENDA) 10 MG tablet Take 1 tablet (10 mg total) by mouth 2 (two) times daily.  60 tablet  11  . metoprolol tartrate (LOPRESSOR) 25 MG tablet Take 1 tablet (25 mg total) by mouth 2 (two) times daily.  180 tablet  3  . Multiple Vitamin (MULTIVITAMIN) tablet Take 1 tablet by mouth daily.        . Tamsulosin HCl (FLOMAX) 0.4 MG CAPS Take 1 capsule (0.4 mg total) by mouth daily after supper.  20 capsule  1  . ZETIA 10 MG tablet TAKE 1 TABLET (10 MG TOTAL) BY MOUTH DAILY.  30 tablet  10    Allergies  Allergen Reactions  . Aspirin     REACTION: BLEEDING    Family History  Problem Relation Age of Onset  . Hypertension Mother   . Hypertension Father     BP  120/80  Pulse 70  Temp 98.9 F (37.2 C) (Oral)  Resp 16  Wt 181 lb (82.101 kg)  SpO2 97%  Review of Systems Dementia is worse (wife says she can no longer care for pt).  Denies chest pain.    Objective:   Physical Exam VITAL SIGNS:  See vs page GENERAL: no distress LUNGS:  Clear to auscultation Ext: 2+ bilat leg edema     Assessment & Plan:  Edema, possibly due to norvasc HTN is well-controlled, so he can probably tolerate discontinuation of the norvasc, for now Dementia, worse

## 2012-04-08 NOTE — Patient Instructions (Addendum)
Stop taking the amlodipine. Please see dr Debby Bud in 2 weeks. i have requested for someone to visit your home.

## 2012-04-08 NOTE — Telephone Encounter (Signed)
FYI Called [facility] in regards to setting up a consult for PT (per Dr. Everardo All), wife needs assistance for care. Spoke with Delana Meyer, Michelle normal person for [facility] was out of the office.

## 2012-04-12 ENCOUNTER — Encounter (INDEPENDENT_AMBULATORY_CARE_PROVIDER_SITE_OTHER): Payer: Self-pay | Admitting: Surgery

## 2012-04-12 ENCOUNTER — Ambulatory Visit (INDEPENDENT_AMBULATORY_CARE_PROVIDER_SITE_OTHER): Payer: Medicare Other | Admitting: Surgery

## 2012-04-12 VITALS — BP 120/82 | HR 72 | Temp 97.0°F | Resp 18 | Ht 68.0 in | Wt 179.0 lb

## 2012-04-12 DIAGNOSIS — K4020 Bilateral inguinal hernia, without obstruction or gangrene, not specified as recurrent: Secondary | ICD-10-CM

## 2012-04-12 NOTE — Telephone Encounter (Signed)
Spoke with Vernona Rieger with [facility] on 9/23 Pt information updated and scheduled for consult.

## 2012-04-12 NOTE — Patient Instructions (Addendum)
HERNIA REPAIR: POST OP INSTRUCTIONS  1. DIET: Follow a light bland diet the first 24 hours after arrival home, such as soup, liquids, crackers, etc.  Be sure to include lots of fluids daily.  Avoid fast food or heavy meals as your are more likely to get nauseated.  Eat a low fat the next few days after surgery. 2. Take your usually prescribed home medications unless otherwise directed. 3. PAIN CONTROL: a. Pain is best controlled by a usual combination of three different methods TOGETHER: i. Ice/Heat ii. Over the counter pain medication iii. Prescription pain medication b. Most patients will experience some swelling and bruising around the hernia(s) such as the bellybutton, groins, or old incisions.  Ice packs or heating pads (30-60 minutes up to 6 times a day) will help. Use ice for the first few days to help decrease swelling and bruising, then switch to heat to help relax tight/sore spots and speed recovery.  Some people prefer to use ice alone, heat alone, alternating between ice & heat.  Experiment to what works for you.  Swelling and bruising can take several weeks to resolve.   c. It is helpful to take an over-the-counter pain medication regularly for the first few weeks.  Choose one of the following that works best for you: i. Naproxen (Aleve, etc)  Two 220mg tabs twice a day ii. Ibuprofen (Advil, etc) Three 200mg tabs four times a day (every meal & bedtime) iii. Acetaminophen (Tylenol, etc) 325-650mg four times a day (every meal & bedtime) d. A  prescription for pain medication should be given to you upon discharge.  Take your pain medication as prescribed.  i. If you are having problems/concerns with the prescription medicine (does not control pain, nausea, vomiting, rash, itching, etc), please call us [phone] to see if we need to switch you to a different pain medicine that will work better for you and/or control your side effect better. ii. If you need a refill on your pain  medication, please contact your pharmacy.  They will contact our office to request authorization. Prescriptions will not be filled after 5 pm or on week-ends. 4. Avoid getting constipated.  Between the surgery and the pain medications, it is common to experience some constipation.  Increasing fluid intake and taking a fiber supplement (such as Metamucil, Citrucel, FiberCon, MiraLax, etc) 1-2 times a day regularly will usually help prevent this problem from occurring.  A mild laxative (prune juice, Milk of Magnesia, MiraLax, etc) should be taken according to package directions if there are no bowel movements after 48 hours.   5. Wash / shower every day.  You may shower over the dressings as they are waterproof.   6. Remove your waterproof bandages 5 days after surgery.  You may leave the incision open to air.  You may replace a dressing/Band-Aid to cover the incision for comfort if you wish.  Continue to shower over incision(s) after the dressing is off.    7. ACTIVITIES as tolerated:   a. You may resume regular (light) daily activities beginning the next day-such as daily self-care, walking, climbing stairs-gradually increasing activities as tolerated.  If you can walk 30 minutes without difficulty, it is safe to try more intense activity such as jogging, treadmill, bicycling, low-impact aerobics, swimming, etc. b. Save the most intensive and strenuous activity for last such as sit-ups, heavy lifting, contact sports, etc  Refrain from any heavy lifting or straining until you are off narcotics for pain control.     c. DO NOT PUSH THROUGH PAIN.  Let pain be your guide: If it hurts to do something, don't do it.  Pain is your body warning you to avoid that activity for another week until the pain goes down. d. You may drive when you are no longer taking prescription pain medication, you can comfortably wear a seatbelt, and you can safely maneuver your car and apply brakes. e. Bonita Quin may have sexual intercourse  when it is comfortable.  8. FOLLOW UP in our office a. Please call [facility] at [phone] to set up an appointment to see your surgeon in the office for a follow-up appointment approximately 2-3 weeks after your surgery. b. Make sure that you call for this appointment the day you arrive home to insure a convenient appointment time. 9.  IF YOU HAVE DISABILITY OR FAMILY LEAVE FORMS, BRING THEM TO THE OFFICE FOR PROCESSING.  DO NOT GIVE THEM TO YOUR DOCTOR.  WHEN TO CALL us [phone]: 1. Poor pain control 2. Reactions / problems with new medications (rash/itching, nausea, etc)  3. Fever over 101.5 F (38.5 C) 4. Inability to urinate 5. Nausea and/or vomiting 6. Worsening swelling or bruising 7. Continued bleeding from incision. 8. Increased pain, redness, or drainage from the incision   The clinic staff is available to answer your questions during regular business hours (8:30am-5pm).  Please don't hesitate to call and ask to speak to one of our nurses for clinical concerns.   If you have a medical emergency, go to the nearest emergency room or call 911.  A surgeon from [facility] is always on call at the hospitals in [facility], [place] [address], [place], [place]  [address] ?  [address] [address], [place], [place]   [address] MAIN: [phone] ? TOLL FREE: [phone] ? FAX: [phone] [email or web address]  Dementia Dementia is a general term for problems with brain function. A person with dementia has memory loss and a hard time with at least one other brain function such as thinking, speaking, or problem solving. Dementia can affect social functioning, how you do your job, your mood, or your personality. The changes may be hidden for a long time. The earliest forms of this disease are usually not detected by family or friends. Dementia can be:  Irreversible.   Potentially reversible.   Partially reversible.    Progressive. This means it can get worse over time.  CAUSES  Irreversible dementia causes may include:  Degeneration of brain cells (Alzheimer's disease or lewy body dementia).   Multiple small strokes (vascular dementia).   Infection (chronic meningitis or Creutzfelt-Jakob disease).   Frontotemporal dementia. This affects younger people, age 40 to 87, compared to those who have Alzheimer's disease.   Dementia associated with other disorders like Parkinson's disease, Huntington's disease, or HIV-associated dementia.  Potentially or partially reversible dementia causes may include:  Medicines.   Metabolic causes such as excessive alcohol intake, vitamin B12 deficiency, or thyroid disease.   Masses or pressure in the brain such as a tumor, blood clot, or hydrocephalus.  SYMPTOMS  Symptoms are often hard to detect. Family members or coworkers may not notice them early in the disease process. Different people with dementia may have different symptoms. Symptoms can include:  A hard time with memory, especially recent memory. Long-term memory may not be impaired.   Asking the same question multiple times or forgetting something someone just said.   A hard time speaking your  thoughts or finding certain words.   A hard time solving problems or performing familiar tasks (such as how to use a telephone).   Sudden changes in mood.   Changes in personality, especially increasing moodiness or mistrust.   Depression.   A hard time understanding complex ideas that were never a problem in the past.  DIAGNOSIS  There are no specific tests for dementia.   Your caregiver may recommend a thorough evaluation. This is because some forms of dementia can be reversible. The evaluation will likely include a physical exam and getting a detailed history from you and a family member. The history often gives the best clues and suggestions for a diagnosis.   Memory testing may be done. A detailed  brain function evaluation called neuropsychologic testing may be helpful.   Lab tests and brain imaging (such as a CT scan or MRI scan) are sometimes important.   Sometimes observation and re-evaluation over time is very helpful.  TREATMENT  Treatment depends on the cause.   If the problem is a vitamin deficiency, it may be helped or cured with supplements.   For dementias such as Alzheimer's disease, medicines are available to stabilize or slow the course of the disease. There are no cures for this type of dementia.   Your caregiver can help direct you to groups, organizations, and other caregivers to help with decisions in the care of you or your loved one.  HOME CARE INSTRUCTIONS The care of individuals with dementia is varied and dependent upon the progression of the dementia. The following suggestions are intended for the person living with, or caring for, the person with dementia.  Create a safe environment.   Remove the locks on bathroom doors to prevent the person from accidentally locking himself or herself in.   Use childproof latches on kitchen cabinets and any place where cleaning supplies, chemicals, or alcohol are kept.   Use childproof covers in unused electrical outlets.   Install childproof devices to keep doors and windows secured.   Remove stove knobs or install safety knobs and an automatic shut-off on the stove.   Lower the temperature on water heaters.   Label medicines and keep them locked up.   Secure knives, lighters, matches, power tools, and guns, and keep these items out of reach.   Keep the house free from clutter. Remove rugs or anything that might contribute to a fall.   Remove objects that might break and hurt the person.   Make sure lighting is good, both inside and outside.   Install grab rails as needed.   Use a monitoring device to alert you to falls or other needs for help.   Reduce confusion.   Keep familiar objects and people  around.   Use night lights or dim lights at night.   Label items or areas.   Use reminders, notes, or directions for daily activities or tasks.   Keep a simple, consistent routine for waking, meals, bathing, dressing, and bedtime.   Create a calm, quiet environment.   Place large clocks and calendars prominently.   Display emergency numbers and home address near all telephones.   Use cues to establish different times of the day. An example is to open curtains to let the natural light in during the day.    Use effective communication.   Choose simple words and short sentences.   Use a gentle, calm tone of voice.   Be careful not to interrupt.   If  the person is struggling to find a word or communicate a thought, try to provide the word or thought.   Ask one question at a time. Allow the person ample time to answer questions. Repeat the question again if the person does not respond.   Reduce nighttime restlessness.   Provide a comfortable bed.   Have a consistent nighttime routine.   Ensure a regular walking or physical activity schedule. Involve the person in daily activities as much as possible.   Limit napping during the day.   Limit caffeine.   Attend social events that stimulate rather than overwhelm the senses.   Encourage good nutrition and hydration.   Reduce distractions during meal times and snacks.   Avoid foods that are too hot or too cold.   Monitor chewing and swallowing ability.   Continue with routine vision, hearing, dental, and medical screenings.   Only give over-the-counter or prescription medicines as directed by the caregiver.   Monitor driving abilities. Do not allow the person to drive when safe driving is no longer possible.   Register with an identification program which could provide location assistance in the event of a missing person situation.  SEEK MEDICAL CARE IF:   New behavioral problems start such as moodiness,  aggressiveness, or seeing things that are not there (hallucinations).   Any new problem with brain function happens. This includes problems with balance, speech, or falling a lot.   Problems with swallowing develop.   Any symptoms of other illness happen.  Small changes or worsening in any aspect of brain function can be a sign that the illness is getting worse. It can also be a sign of another medical illness such as infection. Seeing a caregiver right away is important. SEEK IMMEDIATE MEDICAL CARE IF:   A fever develops.   New or worsened confusion develops.   New or worsened sleepiness develops.   Staying awake becomes hard to do.  Document Released: 12/30/2000 Document Revised: 06/25/2011 Document Reviewed: 12/01/2010 [facility] Patient Information 2012 [place], [place].

## 2012-04-12 NOTE — Progress Notes (Signed)
Subjective:     Patient ID: Chad Delacruz, male   DOB: 1927/06/10, 78 y.o.   MRN: [id number]  HPI  Chad Delacruz  07-17-1927 [id number]  Patient Care Team: Jacques Navy, MD as PCP - General Tonny Bollman, MD as Consulting Physician (Cardiology)  This patient is a 78 y.o.male who presents today for surgical evaluation.   The patient comes in today with his daughter.  Denies pain.  Never took any medications.  Urinating fine.  No constipation.  No fevers or chills.  He still has issues of being somewhat withdrawn and confused consistent with his early dementia.  No changes in behavior.  Getting around fine.  Patient Active Problem List  Diagnosis  . HYPERLIPIDEMIA  . MITRAL REGURGITATION  . Unspecified essential hypertension  . MITRAL VALVE PROLAPSE  . ESOPHAGEAL STRICTURE  . PUD  . HIATAL HERNIA  . OSTEOARTHRITIS  . GASTROINTESTINAL HEMORRHAGE, HX OF  . BENIGN PROSTATIC HYPERTROPHY, HX OF  . Routine general medical examination at a health care facility  . Dementia  . Abdominal tenderness, periumbilic  . Preoperative clearance  . Bilateral inguinal hernia (BIH), L>R  . BPH (benign prostatic hyperplasia)    Past Medical History  Diagnosis Date  . HYPERLIPIDEMIA   . MITRAL REGURGITATION   . Unspecified essential hypertension   . MITRAL VALVE PROLAPSE   . ESOPHAGEAL STRICTURE   . PUD   . HIATAL HERNIA   . OSTEOARTHRITIS   . GASTROINTESTINAL HEMORRHAGE, HX OF   . BENIGN PROSTATIC HYPERTROPHY, HX OF     Past Surgical History  Procedure Date  . Hemorrhoid surgery   . Circumcision   . Mitral valve repair 05/07/2009    Dr. Cornelius Moras  . Hernia repair 03-31-12    BIH repair     History   Social History  . Marital Status: Married    Spouse Name: N/A    Number of Children: N/A  . Years of Education: N/A   Occupational History  . Not on file.   Social History Main Topics  . Smoking status: Never Smoker   . Smokeless tobacco: Never Used  . Alcohol Use: No  .  Drug Use: No  . Sexually Active: Not Currently   Other Topics Concern  . Not on file   Social History Narrative   11th grade. Married 1950 - life sentence. 3 dtrs - '49, '50, '52; 2 sons - '51, '75. He had children by three women. 4 grandchildren. 1 great-grand.   He previously worked for [employer] 25 years, retired from that. Worked as a Nutritional therapist but totally retired in 2011. End of Life care - wishes full resuscitation and intensive treatment to include mechanical ventilation. Lives alone with wife and is independent in ADLs. Very limited in activity: no yard work.     Family History  Problem Relation Age of Onset  . Hypertension Mother   . Hypertension Father     Current Outpatient Prescriptions  Medication Sig Dispense Refill  . acetaminophen (PAIN RELIEF) 500 MG tablet Take 500 mg by mouth every 6 (six) hours as needed.        Marland Kitchen atorvastatin (LIPITOR) 80 MG tablet Take 1 tablet (80 mg total) by mouth daily.  90 tablet  3  . citalopram (CELEXA) 20 MG tablet Take 1 tablet (20 mg total) by mouth daily.  30 tablet  11  . furosemide (LASIX) 20 MG tablet Take 1 tablet (20 mg total) by mouth daily.  30 tablet  11  . haloperidol (HALDOL) 1 MG tablet Take 2 tablets (2 mg total) by mouth at bedtime.  60 tablet  5  . memantine (NAMENDA) 10 MG tablet Take 1 tablet (10 mg total) by mouth 2 (two) times daily.  60 tablet  11  . metoprolol tartrate (LOPRESSOR) 25 MG tablet Take 1 tablet (25 mg total) by mouth 2 (two) times daily.  180 tablet  3  . Multiple Vitamin (MULTIVITAMIN) tablet Take 1 tablet by mouth daily.        . Tamsulosin HCl (FLOMAX) 0.4 MG CAPS Take 1 capsule (0.4 mg total) by mouth daily after supper.  20 capsule  1  . traMADol (ULTRAM) 50 MG tablet       . ZETIA 10 MG tablet TAKE 1 TABLET (10 MG TOTAL) BY MOUTH DAILY.  30 tablet  10     Allergies  Allergen Reactions  . Aspirin     REACTION: BLEEDING    BP 120/82  Pulse 72  Temp 97 F (36.1 C) (Oral)  Resp 18   Ht 5\' 8"  (1.727 m)  Wt 179 lb (81.194 kg)  BMI 27.22 kg/m2  No results found.   Review of Systems  Constitutional: Negative for fever, chills and diaphoresis.  HENT: Negative for sore throat, trouble swallowing and neck pain.   Eyes: Negative for photophobia and visual disturbance.  Respiratory: Negative for choking and shortness of breath.   Cardiovascular: Negative for chest pain and palpitations.  Gastrointestinal: Negative for nausea, vomiting, abdominal distention, anal bleeding and rectal pain.  Genitourinary: Negative for dysuria, urgency, difficulty urinating and testicular pain.  Musculoskeletal: Negative for myalgias, arthralgias and gait problem.  Skin: Negative for color change and rash.  Neurological: Negative for dizziness, speech difficulty, weakness and numbness.  Hematological: Negative for adenopathy.  Psychiatric/Behavioral: Positive for confusion and decreased concentration. Negative for suicidal ideas, hallucinations, behavioral problems, dysphoric mood and agitation. The patient is not nervous/anxious.        Objective:   Physical Exam  Constitutional: He is oriented to person, place, and time. He appears well-developed and well-nourished. No distress.  HENT:  Head: Normocephalic.  Mouth/Throat: Oropharynx is clear and moist. No oropharyngeal exudate.  Eyes: Conjunctivae normal and EOM are normal. Pupils are equal, round, and reactive to light. No scleral icterus.  Neck: Normal range of motion. No tracheal deviation present.  Cardiovascular: Normal rate, normal heart sounds and intact distal pulses.   Pulmonary/Chest: Effort normal. No respiratory distress.  Abdominal: Soft. He exhibits no distension. There is no tenderness. Hernia confirmed negative in the right inguinal area and confirmed negative in the left inguinal area.       Dressings were still on.  I remove them.  They were pretty much still clean.  Incisions clean with normal healing ridges.  No  hernias  Musculoskeletal: Normal range of motion. He exhibits no tenderness.  Neurological: He is alert and oriented to person, place, and time. No cranial nerve deficit. He exhibits normal muscle tone. Coordination normal.  Skin: Skin is warm and dry. No rash noted. He is not diaphoretic.  Psychiatric: He has a normal mood and affect. His mood appears not anxious. His affect is not angry and not labile. His speech is not rapid and/or pressured and not slurred. He is slowed and withdrawn. He is not agitated, not aggressive and not combative. Thought content is not paranoid and not delusional. Cognition and memory are impaired. He exhibits abnormal recent memory and abnormal remote memory. He  is attentive.       Assessment:     2 weeks s/p lap BIH repair, recovering from surgery  Moderate dementia, stable    Plan:     Increase activity as tolerated.  Do not push through pain.  Looking at options for eldercare support w dementia  Advanced on diet as tolerated. Bowel regimen to avoid problems.  Return to clinic p.r.n. The patient & his daughter expressed understanding and appreciation

## 2012-04-14 ENCOUNTER — Encounter (INDEPENDENT_AMBULATORY_CARE_PROVIDER_SITE_OTHER): Payer: Self-pay | Admitting: General Surgery

## 2012-04-14 ENCOUNTER — Encounter (INDEPENDENT_AMBULATORY_CARE_PROVIDER_SITE_OTHER): Payer: Medicare Other | Admitting: Surgery

## 2012-04-21 ENCOUNTER — Ambulatory Visit (INDEPENDENT_AMBULATORY_CARE_PROVIDER_SITE_OTHER): Payer: Medicare Other | Admitting: Internal Medicine

## 2012-04-21 ENCOUNTER — Encounter: Payer: Self-pay | Admitting: Internal Medicine

## 2012-04-21 VITALS — BP 110/62 | HR 62 | Temp 97.7°F | Resp 14 | Wt 178.0 lb

## 2012-04-21 DIAGNOSIS — I10 Essential (primary) hypertension: Secondary | ICD-10-CM

## 2012-04-21 DIAGNOSIS — F0390 Unspecified dementia without behavioral disturbance: Secondary | ICD-10-CM

## 2012-04-21 DIAGNOSIS — Z23 Encounter for immunization: Secondary | ICD-10-CM

## 2012-04-21 MED ORDER — HALOPERIDOL 1 MG PO TABS: 5.0000 mg | ORAL_TABLET | Freq: Every day | ORAL | Status: DC

## 2012-04-21 NOTE — Patient Instructions (Addendum)
Peripheral edema (leg swelling) - much better. Easy fix. And blood pressure is doing well.  Sundowning - night time wandering, agitation and hallucination - part of dementia.  Plan - increase haloperidol to 5 mg once at bedtime.  Watch for over sedation and any problems with rigidity of abnormal facial ticks.  Anxiety - need to stop the citalopram due to potential drug interaction with Haloperiodol. Plan - take celexa every other day for 1 week, then every 3rd day for a week, then off  Call for any increased agitation, excessive somnolence or any major change in mental status.

## 2012-04-24 NOTE — Progress Notes (Signed)
Subjective:    Patient ID: Chad Delacruz, male    DOB: Jul 04, 1927, 78 y.o.   MRN: [id number]  HPI Chad Delacruz presents for follow-up of peripheral edema. He was seen Sept 20th by Dr. Sherrilyn Rist and was taken off norvasc as possible cause of peripheral edema. In the interval the edema has resolved and his BP has remained well controlled.   Sept 24th he was seen by Dr. Michaell Cowing for follow-up after hernia repair. He did very well with surgery and has healed well w/o complications.  Pt's family reports that he is having night time wandering, has reversed his sleep cycle and does have more hallucinosis. He is already on Haloperidol 2 mg qHS along with celexa. The family describes progressive sundowning.  Past Medical History  Diagnosis Date  . HYPERLIPIDEMIA   . MITRAL REGURGITATION   . Unspecified essential hypertension   . MITRAL VALVE PROLAPSE   . ESOPHAGEAL STRICTURE   . PUD   . HIATAL HERNIA   . OSTEOARTHRITIS   . GASTROINTESTINAL HEMORRHAGE, HX OF   . BENIGN PROSTATIC HYPERTROPHY, HX OF    Past Surgical History  Procedure Date  . Hemorrhoid surgery   . Circumcision   . Mitral valve repair 05/07/2009    Dr. Cornelius Moras  . Hernia repair 03-31-12    BIH repair    Family History  Problem Relation Age of Onset  . Hypertension Mother   . Hypertension Father    History   Social History  . Marital Status: Married    Spouse Name: N/A    Number of Children: N/A  . Years of Education: N/A   Occupational History  . Not on file.   Social History Main Topics  . Smoking status: Never Smoker   . Smokeless tobacco: Never Used  . Alcohol Use: No  . Drug Use: No  . Sexually Active: Not Currently   Other Topics Concern  . Not on file   Social History Narrative   11th grade. Married 1950 - life sentence. 3 dtrs - '49, '50, '52; 2 sons - '51, '75. He had children by three women. 4 grandchildren. 1 great-grand.   He previously worked for [employer] 25 years, retired from that. Worked as  a Nutritional therapist but totally retired in 2011. End of Life care - wishes full resuscitation and intensive treatment to include mechanical ventilation. Lives alone with wife and is independent in ADLs. Very limited in activity: no yard work.     Current Outpatient Prescriptions on File Prior to Visit  Medication Sig Dispense Refill  . acetaminophen (PAIN RELIEF) 500 MG tablet Take 500 mg by mouth every 6 (six) hours as needed.        Marland Kitchen atorvastatin (LIPITOR) 80 MG tablet Take 1 tablet (80 mg total) by mouth daily.  90 tablet  3  . citalopram (CELEXA) 20 MG tablet Take 1 tablet (20 mg total) by mouth daily.  30 tablet  11  . furosemide (LASIX) 20 MG tablet Take 1 tablet (20 mg total) by mouth daily.  30 tablet  11  . haloperidol (HALDOL) 1 MG tablet Take 5 tablets (5 mg total) by mouth at bedtime.  30 tablet  5  . memantine (NAMENDA) 10 MG tablet Take 1 tablet (10 mg total) by mouth 2 (two) times daily.  60 tablet  11  . metoprolol tartrate (LOPRESSOR) 25 MG tablet Take 1 tablet (25 mg total) by mouth 2 (two) times daily.  180 tablet  3  .  Multiple Vitamin (MULTIVITAMIN) tablet Take 1 tablet by mouth daily.        . Tamsulosin HCl (FLOMAX) 0.4 MG CAPS Take 1 capsule (0.4 mg total) by mouth daily after supper.  20 capsule  1  . traMADol (ULTRAM) 50 MG tablet       . ZETIA 10 MG tablet TAKE 1 TABLET (10 MG TOTAL) BY MOUTH DAILY.  30 tablet  10      Review of Systems System review is negative for any constitutional, cardiac, pulmonary, GI or neuro symptoms or complaints other than as described in the HPI.      Objective:   Physical Exam Filed Vitals:   04/21/12 1313  BP: 110/62  Pulse: 62  Temp: 97.7 F (36.5 C)  Resp: 14   Gen'l- Elderly AA man in no distress Cor- RRR Pulm - normal respirations Neuro - awake, follows one step commands, has no facial ticks, no tongue fasciculations, no cog-wheeling or pipe-stem rigidity.       Assessment & Plan:  Peripheral edema (leg swelling) - much  better. Easy fix. And blood pressure is doing well.  Sundowning - night time wandering, agitation and hallucination - part of dementia.  Plan - increase haloperidol to 5 mg once at bedtime.  Watch for over sedation and any problems with rigidity of abnormal facial ticks.  Anxiety - need to stop the citalopram due to potential drug interaction with Haloperiodol. Plan - take celexa every other day for 1 week, then every 3rd day for a week, then off  Call for any increased agitation, excessive somnolence or any major change in mental status.

## 2012-04-24 NOTE — Assessment & Plan Note (Signed)
BP Readings from Last 3 Encounters:  04/21/12 110/62  04/12/12 120/82  04/08/12 120/80   Good control even w/o Norvasc  Plan - continue present medical regimen.

## 2012-04-24 NOTE — Assessment & Plan Note (Signed)
Progressive sun-downing. Reviewed his medications. Discussed with his wife and daughter the potential adverse effects of typical anti-psychotics including increase in all cause mortality in study of NH patients. On exam no extrapyramidal symptoms.  Plan Increase haloperidol to 5 mg daily  Taper off celexa due to drug-drug interaction with haloperidol.

## 2012-05-24 ENCOUNTER — Telehealth: Payer: Self-pay | Admitting: *Deleted

## 2012-05-24 NOTE — Telephone Encounter (Signed)
Nurse care management with [facility] called concerning patient. Patient is not sleep at nite. Walking and talking at [employer]. He takes cat naps during the day. requestinig medication to help with this problem.  Vital signs  BP 100/60 HR 53 O2 99%.. Family is looking for placement for patient. CB# nurse Iris Pert [phone]

## 2012-05-25 ENCOUNTER — Telehealth: Payer: Self-pay | Admitting: *Deleted

## 2012-05-25 MED ORDER — HALOPERIDOL 5 MG PO TABS: 5.0000 mg | ORAL_TABLET | Freq: Every day | ORAL | Status: DC

## 2012-05-25 NOTE — Telephone Encounter (Signed)
Called patient wife of Dr. Debby Bud response concering patient not sleeping at nite. Instructed patient on medication of citalopram and how to wean patient off of medication . Copy of office note sent to Ms. Reichow with instructions of this. She stated she was not aware to wean him off of citalopram and to stop this medication. New prescription for Halperidol 5mg  tablet one tablet at bedtime sent to [facility] pharmacy as requested by Ms. Riley Lam.

## 2012-05-25 NOTE — Telephone Encounter (Signed)
Have seen patient for this problem of sun-downing in the demented. He is already taking Haldol 5 mg qHS (big dose) and family had been advised to work on his sleep hygiene - try to keep him up during the day. He is also on Namenda. He will need psychiatric eval for additional medication changes (family could not afford atypical anti-psychotics). Open to suggestions from [facility] staff.

## 2012-05-25 NOTE — Telephone Encounter (Signed)
Returned call to [place], [facility] network manager, of Dr, Debby Bud response to family concerns of patient not sleeping. She states was calling on behalf of spouse for these concerns and is trying to help with placement of patient for the family. Will call the patient wife also with Dr. Debby Bud response.

## 2012-07-19 ENCOUNTER — Ambulatory Visit (INDEPENDENT_AMBULATORY_CARE_PROVIDER_SITE_OTHER): Payer: Medicare Other | Admitting: Internal Medicine

## 2012-07-19 ENCOUNTER — Encounter: Payer: Self-pay | Admitting: Internal Medicine

## 2012-07-19 VITALS — BP 112/70 | HR 61 | Temp 97.4°F | Resp 10 | Wt 173.1 lb

## 2012-07-19 DIAGNOSIS — I10 Essential (primary) hypertension: Secondary | ICD-10-CM

## 2012-07-19 DIAGNOSIS — F0390 Unspecified dementia without behavioral disturbance: Secondary | ICD-10-CM

## 2012-07-19 NOTE — Patient Instructions (Addendum)
Sundowning - dementia with psychotic features of not sleeping, totally disoriented, hallucinations both visual and auditory. Very little to do non-pharmacologically but you can be sure there is reorientation, avoid over stimulation, familiar surroundings. For drug therapy we can try seroquel XR 50 mg starting with 1/2 tablet, 25 mg, at bedtime. If after 2 or 3 nights there is an effect but not enough can increase to 50 mg. Maximum dose for this problem is 75 mg twice a day.  If this doesn't work we can try another atypical anti-psychotic like Risperdal.

## 2012-07-20 NOTE — Assessment & Plan Note (Signed)
BP Readings from Last 3 Encounters:  07/19/12 112/70  04/21/12 110/62  04/12/12 120/82   Good control

## 2012-07-20 NOTE — Progress Notes (Signed)
  Subjective:    Patient ID: Chad Delacruz, male    DOB: [birth year], 79 y.o.   MRN: [id number]  HPI Chad Delacruz returns with his wife and daughter for follow up of dementia. By their report he has persistent sundowning with restless, lack of sleep for days at a time, visual and auditory hallucinations, no threatening agitation. Mrs. Langenderfer states that she will need to look to placement because Chad Delacruz is more than she can manage, especially with the sundowning. He has failed haloperidol in the past.  PMH, FamHx and SocHx reviewed for any changes and relevance. Current Outpatient Prescriptions on File Prior to Visit  Medication Sig Dispense Refill  . acetaminophen (PAIN RELIEF) 500 MG tablet Take 500 mg by mouth every 6 (six) hours as needed.        Marland Kitchen atorvastatin (LIPITOR) 80 MG tablet Take 1 tablet (80 mg total) by mouth daily.  90 tablet  3  . citalopram (CELEXA) 20 MG tablet Take 1 tablet (20 mg total) by mouth daily.  30 tablet  11  . furosemide (LASIX) 20 MG tablet Take 1 tablet (20 mg total) by mouth daily.  30 tablet  11  . metoprolol tartrate (LOPRESSOR) 25 MG tablet Take 1 tablet (25 mg total) by mouth 2 (two) times daily.  180 tablet  3  . Multiple Vitamin (MULTIVITAMIN) tablet Take 1 tablet by mouth daily.        . Tamsulosin HCl (FLOMAX) 0.4 MG CAPS Take 1 capsule (0.4 mg total) by mouth daily after supper.  20 capsule  1  . ZETIA 10 MG tablet TAKE 1 TABLET (10 MG TOTAL) BY MOUTH DAILY.  30 tablet  10  . traMADol (ULTRAM) 50 MG tablet           Review of Systems System review is negative for any constitutional, cardiac, pulmonary, GI or neuro symptoms or complaints other than as described in the HPI.     Objective:   Physical Exam Filed Vitals:   07/19/12 1410  BP: 112/70  Pulse: 61  Temp: 97.4 F (36.3 C)  Resp: 10   gen'l- WNWD AA man in no distress. He does list to the left side and seem to nod off during the interview HEENT- normal Cor 2+ radial pulse Pulm -  normal respirations Neuro - awake, answers questions but lacks insight to his condition. He does admit to hallucinosis.       Assessment & Plan:

## 2012-07-20 NOTE — Assessment & Plan Note (Signed)
Progressive decline with sleeplessness and hallucinosis. Long discussion with wife and daughter about treatment, including rapid literature search and use of UpToDate: atypical anti-psychotics are the drugs of choice. discussed the the issue of increased all cause mortality in institutionalized patients given anti-psychotics and reviewed risk - benefit with them.  Plan Trial of Seroquel XR starting at 25 mg (1/2 50 mg tablet) with permission to increase to 50 mg if not adequate response.  (greater than 50% of 40 min visit spent on education and counseling)

## 2012-07-29 ENCOUNTER — Emergency Department (HOSPITAL_COMMUNITY)
Admission: EM | Admit: 2012-07-29 | Discharge: 2012-07-29 | Disposition: A | Discharge status: Home / Self Care | Payer: Medicare Other | Attending: Emergency Medicine | Admitting: Emergency Medicine

## 2012-07-29 ENCOUNTER — Encounter (HOSPITAL_COMMUNITY): Payer: Self-pay | Admitting: Emergency Medicine

## 2012-07-29 DIAGNOSIS — Y9389 Activity, other specified: Secondary | ICD-10-CM | POA: Insufficient documentation

## 2012-07-29 DIAGNOSIS — Y92009 Unspecified place in unspecified non-institutional (private) residence as the place of occurrence of the external cause: Secondary | ICD-10-CM | POA: Insufficient documentation

## 2012-07-29 DIAGNOSIS — E785 Hyperlipidemia, unspecified: Secondary | ICD-10-CM | POA: Insufficient documentation

## 2012-07-29 DIAGNOSIS — F0280 Dementia in other diseases classified elsewhere without behavioral disturbance: Secondary | ICD-10-CM | POA: Insufficient documentation

## 2012-07-29 DIAGNOSIS — Z79899 Other long term (current) drug therapy: Secondary | ICD-10-CM | POA: Insufficient documentation

## 2012-07-29 DIAGNOSIS — Z23 Encounter for immunization: Secondary | ICD-10-CM | POA: Insufficient documentation

## 2012-07-29 DIAGNOSIS — G309 Alzheimer's disease, unspecified: Secondary | ICD-10-CM | POA: Insufficient documentation

## 2012-07-29 DIAGNOSIS — Z8739 Personal history of other diseases of the musculoskeletal system and connective tissue: Secondary | ICD-10-CM | POA: Insufficient documentation

## 2012-07-29 DIAGNOSIS — Z87448 Personal history of other diseases of urinary system: Secondary | ICD-10-CM | POA: Insufficient documentation

## 2012-07-29 DIAGNOSIS — Z8679 Personal history of other diseases of the circulatory system: Secondary | ICD-10-CM | POA: Insufficient documentation

## 2012-07-29 DIAGNOSIS — S0191XA Laceration without foreign body of unspecified part of head, initial encounter: Secondary | ICD-10-CM

## 2012-07-29 DIAGNOSIS — Z8719 Personal history of other diseases of the digestive system: Secondary | ICD-10-CM | POA: Insufficient documentation

## 2012-07-29 DIAGNOSIS — I10 Essential (primary) hypertension: Secondary | ICD-10-CM | POA: Insufficient documentation

## 2012-07-29 DIAGNOSIS — W1809XA Striking against other object with subsequent fall, initial encounter: Secondary | ICD-10-CM | POA: Insufficient documentation

## 2012-07-29 DIAGNOSIS — S0100XA Unspecified open wound of scalp, initial encounter: Secondary | ICD-10-CM | POA: Insufficient documentation

## 2012-07-29 MED ORDER — TETANUS-DIPHTH-ACELL PERTUSSIS 5-2.5-18.5 LF-MCG/0.5 IM SUSP
0.5000 mL | Freq: Once | INTRAMUSCULAR | Status: AC
  Administered 2012-07-29: 0.5 mL via INTRAMUSCULAR
  Filled 2012-07-29: qty 0.5

## 2012-07-29 NOTE — ED Provider Notes (Signed)
History   This chart was scribed for non-physician practitioner working with Chad Roots, MD by Chad Delacruz, ED Scribe. This patient was seen in room TR09C/TR09C and the patient's care was started at 2026.   CSN: [id number]  Arrival date & time 07/29/12  [id number]   First MD Initiated Contact with Patient 07/29/12 2026      Chief Complaint  Patient presents with  . Head Laceration    (Consider location/radiation/quality/duration/timing/severity/associated sxs/prior treatment) Patient is a 79 y.o. male presenting with scalp laceration. The history is provided by the patient. No language interpreter was used.  Head Laceration This is a new problem. The current episode started less than 1 hour ago. The problem occurs constantly. The problem has been rapidly improving. Pertinent negatives include no abdominal pain and no shortness of breath. He has tried nothing for the symptoms.    Chad Delacruz is a 79 y.o. male with a h/o of Alzheime'rs who presents to the Emergency Department complaining of a constant, moderate laceration to the top of his head after he lost his balance and fell and hit his head on the corner of his dresser while getting out of a chair. In ED, there is no active bleeding. His daughter denies any LOC and reports that the confusion he is exhibiting in ED is baseline and consistent with his Alzheimer's. She is unsure of his last tetanus shot.  PCP is Dr. Tommy Delacruz.   Past Medical History  Diagnosis Date  . HYPERLIPIDEMIA   . MITRAL REGURGITATION   . Unspecified essential hypertension   . MITRAL VALVE PROLAPSE   . ESOPHAGEAL STRICTURE   . PUD   . HIATAL HERNIA   . OSTEOARTHRITIS   . GASTROINTESTINAL HEMORRHAGE, HX OF   . BENIGN PROSTATIC HYPERTROPHY, HX OF     Past Surgical History  Procedure Date  . Hemorrhoid surgery   . Circumcision   . Mitral valve repair 05/07/2009    Chad Delacruz  . Hernia repair 03-31-12    BIH repair   . Hernia repair 03/2012    Family  History  Problem Relation Age of Onset  . Hypertension Mother   . Hypertension Father     History  Substance Use Topics  . Smoking status: Never Smoker   . Smokeless tobacco: Never Used  . Alcohol Use: No      Review of Systems  Respiratory: Negative for shortness of breath.   Gastrointestinal: Negative for abdominal pain.  Skin: Positive for wound.  All other systems reviewed and are negative.    Allergies  Aspirin  Home Medications   Current Outpatient Rx  Name  Route  Sig  Dispense  Refill  . ACETAMINOPHEN 500 MG PO TABS   Oral   Take 500 mg by mouth every 6 (six) hours as needed. For pain         . ATORVASTATIN CALCIUM 80 MG PO TABS   Oral   Take 1 tablet (80 mg total) by mouth daily.   90 tablet   3   . CITALOPRAM HYDROBROMIDE 20 MG PO TABS   Oral   Take 1 tablet (20 mg total) by mouth daily.   30 tablet   11   . EZETIMIBE 10 MG PO TABS   Oral   Take 10 mg by mouth daily.         . FUROSEMIDE 20 MG PO TABS   Oral   Take 1 tablet (20 mg total) by mouth daily.   30  tablet   11   . METOPROLOL TARTRATE 25 MG PO TABS   Oral   Take 1 tablet (25 mg total) by mouth 2 (two) times daily.   180 tablet   3   . ONE-DAILY MULTI VITAMINS PO TABS   Oral   Take 1 tablet by mouth daily.           Marland Kitchen TAMSULOSIN HCL 0.4 MG PO CAPS   Oral   Take 0.4 mg by mouth daily after supper.           BP 125/73  Pulse 66  Temp 97.9 F (36.6 C) (Oral)  Resp 16  SpO2 93%  Physical Exam  Nursing note and vitals reviewed. Constitutional: He is oriented to person, place, and time. He appears well-developed and well-nourished. No distress.  HENT:  Head: Normocephalic and atraumatic.       He has a 3 inch laceration to mid-parietal area on the left.  Eyes: EOM are normal. Pupils are equal, round, and reactive to light.  Neck: Normal range of motion. Neck supple. No tracheal deviation present.  Cardiovascular: Normal rate.   Pulmonary/Chest: Effort normal.  No respiratory distress.  Abdominal: Soft. He exhibits no distension.  Musculoskeletal: Normal range of motion. He exhibits no edema.  Neurological: He is alert and oriented to person, place, and time.  Skin: Skin is warm and dry.  Psychiatric: He has a normal mood and affect. His behavior is normal.    ED Course  Procedures (including critical care time)  DIAGNOSTIC STUDIES: Oxygen Saturation is  93% on room air, normal by my interpretation.    COORDINATION OF CARE:  20:32- Discussed planned course of treatment with the patient, including a laceration repair, who is agreeable at this time.  21:22- LACERATION REPAIR Performed by: Chad Delacruz Consent: Verbal consent obtained. Risks and benefits: risks, benefits and alternatives were discussed Patient identity confirmed: provided demographic data Time out performed prior to procedure Prepped and Draped in normal sterile fashion Wound explored Laceration Location: mid-parietal area on the left  Laceration Length: 3 in No Foreign Bodies seen or palpated Anesthesia: local infiltration Local anesthetic: lidocaine 1%  Anesthetic total: 1.5 cc Irrigation method: syringe Amount of cleaning: standard Skin closure: staples  Number of sutures or staples: 7 Technique: stables  Patient tolerance: Patient tolerated the procedure well with no immediate complications.  22:00- Medication Orders- Tdap (boostrix) injection 0.5 mL- once.   Labs Reviewed - No data to display No results found.   No diagnosis found.  Scalp laceration.  No loss of consciousness.  Patient at baseline mental status.  Return precautions provided to family.  MDM    I personally performed the services described in this documentation, which was scribed in my presence. The recorded information has been reviewed and is accurate.       Chad Norman, NP 07/30/12 [phone]

## 2012-07-29 NOTE — ED Notes (Signed)
Staple gun at bedside

## 2012-07-29 NOTE — ED Notes (Signed)
Pt's daughter st's pt got up from a chair and fell hitting his head on a dresser.  Pt has lac to top of head.  No active bleeding at this time.  No LOC.  Pt is normally confused due to dx of alzheimers

## 2012-07-31 NOTE — ED Provider Notes (Signed)
Medical screening examination/treatment/procedure(s) were performed by non-physician practitioner and as supervising physician I was immediately available for consultation/collaboration.   Jalei Shibley E Joffre Lucks, MD 07/31/12 1515 

## 2012-08-05 ENCOUNTER — Encounter: Payer: Self-pay | Admitting: Internal Medicine

## 2012-08-05 ENCOUNTER — Ambulatory Visit (INDEPENDENT_AMBULATORY_CARE_PROVIDER_SITE_OTHER): Payer: Medicare Other | Admitting: Internal Medicine

## 2012-08-05 ENCOUNTER — Telehealth: Payer: Self-pay | Admitting: Internal Medicine

## 2012-08-05 VITALS — BP 128/84 | HR 69 | Temp 97.9°F

## 2012-08-05 DIAGNOSIS — Z4802 Encounter for removal of sutures: Secondary | ICD-10-CM

## 2012-08-05 NOTE — Patient Instructions (Signed)
Staple Removal  Care After  The staples used to close your skin have been removed. The wound needs continued care so it can heal completely and without problems. The care described here will need to be done for another 5-10 days unless your caregiver advises otherwise.   HOME CARE INSTRUCTIONS    Keep wound site dry and clean.   If skin adhesive strips were applied after the staples were removed, they will begin to peel off in a few days. If they remain after fourteen days, they may be peeled off and discarded.   If you still have a dressing, change it at least once a day or as instructed by your caregiver. If the bandage sticks, soak it off with warm water. Pat dry with a clean towel. Look for signs of infection (see below).   Reapply cream or ointment according to your caregiver's instruction. This will help prevent infection and keep the bandage from sticking. Use of a non-stick material over the wound and under the dressing or wrap will also help keep the bandage from sticking.   If the bandage becomes wet, dirty or develops a foul smell, change it as soon as possible.   New scars become sunburned easily. Use sunscreens with protection factor (SPF) of at least 15 when out in the sun.   Only take over-the-counter or prescription medicines for pain, discomfort or fever as directed by your caregiver.  SEEK IMMEDIATE MEDICAL CARE IF:    There is redness, swelling or increasing pain in the wound.   Pus is coming from the wound.   An unexplained oral temperature above 102 F (38.9 C) develops.   You notice a foul smell coming from the wound or dressing.   There is a breaking open of the suture line (edges not staying together) of the wound edges after staples have been removed.  Document Released: 06/18/2008 Document Revised: 09/28/2011 Document Reviewed: 06/18/2008  ExitCare Patient Information 2013 [employer].

## 2012-08-05 NOTE — Telephone Encounter (Signed)
Victorino Dike @ [facility] is calling to speak with assistant about an FL@ for this patient

## 2012-08-05 NOTE — Progress Notes (Signed)
Subjective:    Patient ID: Chad Delacruz, male    DOB: Sep 09, 1926, 79 y.o.   MRN: [id number]  HPI  Pt presents to the clinic today for staple removal. He went to the ER 1 week ago s/p fall where he sustained a 3 in laceration to the left temporal are of his head. He had 7 staples placed and was told to come to the clinic today to have them removed. He has not had any trouble with them. He has not noticed any drainage from them.  Review of Systems      Past Medical History  Diagnosis Date  . HYPERLIPIDEMIA   . MITRAL REGURGITATION   . Unspecified essential hypertension   . MITRAL VALVE PROLAPSE   . ESOPHAGEAL STRICTURE   . PUD   . HIATAL HERNIA   . OSTEOARTHRITIS   . GASTROINTESTINAL HEMORRHAGE, HX OF   . BENIGN PROSTATIC HYPERTROPHY, HX OF     Current Outpatient Prescriptions  Medication Sig Dispense Refill  . acetaminophen (PAIN RELIEF) 500 MG tablet Take 500 mg by mouth every 6 (six) hours as needed. For pain      . atorvastatin (LIPITOR) 80 MG tablet Take 1 tablet (80 mg total) by mouth daily.  90 tablet  3  . citalopram (CELEXA) 20 MG tablet Take 1 tablet (20 mg total) by mouth daily.  30 tablet  11  . ezetimibe (ZETIA) 10 MG tablet Take 10 mg by mouth daily.      . furosemide (LASIX) 20 MG tablet Take 1 tablet (20 mg total) by mouth daily.  30 tablet  11  . metoprolol tartrate (LOPRESSOR) 25 MG tablet Take 1 tablet (25 mg total) by mouth 2 (two) times daily.  180 tablet  3  . Multiple Vitamin (MULTIVITAMIN) tablet Take 1 tablet by mouth daily.        . Tamsulosin HCl (FLOMAX) 0.4 MG CAPS Take 0.4 mg by mouth daily after supper.        Allergies  Allergen Reactions  . Aspirin Other (See Comments)    REACTION: BLEEDING    Family History  Problem Relation Age of Onset  . Hypertension Mother   . Hypertension Father     History   Social History  . Marital Status: Married    Spouse Name: N/A    Number of Children: N/A  . Years of Education: N/A   Occupational  History  . Not on file.   Social History Main Topics  . Smoking status: Never Smoker   . Smokeless tobacco: Never Used  . Alcohol Use: No  . Drug Use: No  . Sexually Active: Not Currently   Other Topics Concern  . Not on file   Social History Narrative   11th grade. Married 1950 - life sentence. 3 dtrs - '49, '50, '52; 2 sons - '51, '75. He had children by three women. 4 grandchildren. 1 great-grand.   He previously worked for [employer] 25 years, retired from that. Worked as a Nutritional therapist but totally retired in 2011. End of Life care - wishes full resuscitation and intensive treatment to include mechanical ventilation. Lives alone with wife and is independent in ADLs. Very limited in activity: no yard work.      Constitutional: Denies fever, malaise, fatigue, headache or abrupt weight changes.  Skin: Pt with large lac to the left temple, staples intact. Denies redness, rashes, lesions or ulcercations.  Neurological: Denies dizziness, difficulty with memory, difficulty with speech or problems with  balance and coordination.   No other specific complaints in a complete review of systems (except as listed in HPI above).  Objective:   Physical Exam   BP 128/84  Pulse 69  Temp 97.9 F (36.6 C) (Oral)  SpO2 98% Wt Readings from Last 3 Encounters:  07/19/12 173 lb 1.3 oz (78.509 kg)  04/21/12 178 lb (80.74 kg)  04/12/12 179 lb (81.194 kg)    General: Appears their stated age, well developed, well nourished in NAD. Skin: 7 staples noted on the midline scalp, wound intact. Warm, dry and intact. No rashes, lesions or ulcerations noted..  Cardiovascular: Normal rate and rhythm. S1,S2 noted.  No murmur, rubs or gallops noted. No JVD or BLE edema. No carotid bruits noted. Pulmonary/Chest: Normal effort and positive vesicular breath sounds. No respiratory distress. No wheezes, rales or ronchi noted.        Assessment & Plan:   Staple removal of head lac:  Removed seven  staples Wound intact, no drainage noted Washed with warm soap and water Care instructions provided to the patient  RTC as needed or if symptoms persist

## 2012-08-22 ENCOUNTER — Ambulatory Visit (INDEPENDENT_AMBULATORY_CARE_PROVIDER_SITE_OTHER): Payer: Medicare Other | Admitting: *Deleted

## 2012-08-22 DIAGNOSIS — Z23 Encounter for immunization: Secondary | ICD-10-CM

## 2012-08-25 ENCOUNTER — Encounter: Payer: Self-pay | Admitting: *Deleted

## 2012-08-25 LAB — TB SKIN TEST: Induration: 0 mm

## 2012-08-26 ENCOUNTER — Telehealth: Payer: Self-pay | Admitting: *Deleted

## 2012-08-26 NOTE — Telephone Encounter (Signed)
Pt's wife came by the office on Friday afternoon, 08/26/12. Left a note requesting a rx refill for Seroquel/XR 50 mg. Please advise.

## 2012-08-27 MED ORDER — QUETIAPINE FUMARATE ER 50 MG PO TB24: 50.0000 mg | ORAL_TABLET | Freq: Every day | ORAL | Status: DC

## 2012-08-27 NOTE — Telephone Encounter (Signed)
Rx sent to pharmacy   

## 2012-08-29 ENCOUNTER — Telehealth: Payer: Self-pay | Admitting: *Deleted

## 2012-08-29 NOTE — Telephone Encounter (Signed)
Called pt's wife to let her know that the pt's rx for seroquel has been sent to the pharmacy.

## 2012-08-30 ENCOUNTER — Ambulatory Visit: Payer: Medicare Other | Admitting: Cardiovascular Disease

## 2012-09-01 ENCOUNTER — Ambulatory Visit: Payer: Medicare Other | Admitting: Cardiovascular Disease

## 2012-10-12 ENCOUNTER — Ambulatory Visit (INDEPENDENT_AMBULATORY_CARE_PROVIDER_SITE_OTHER): Payer: Medicare Other | Admitting: Internal Medicine

## 2012-10-12 ENCOUNTER — Other Ambulatory Visit (INDEPENDENT_AMBULATORY_CARE_PROVIDER_SITE_OTHER): Payer: Medicare Other

## 2012-10-12 ENCOUNTER — Encounter: Payer: Self-pay | Admitting: Internal Medicine

## 2012-10-12 VITALS — BP 108/60 | HR 59 | Temp 97.5°F | Resp 12 | Wt 184.0 lb

## 2012-10-12 DIAGNOSIS — F0390 Unspecified dementia without behavioral disturbance: Secondary | ICD-10-CM

## 2012-10-12 DIAGNOSIS — E785 Hyperlipidemia, unspecified: Secondary | ICD-10-CM

## 2012-10-12 DIAGNOSIS — I10 Essential (primary) hypertension: Secondary | ICD-10-CM

## 2012-10-12 DIAGNOSIS — R609 Edema, unspecified: Secondary | ICD-10-CM

## 2012-10-12 DIAGNOSIS — R600 Localized edema: Secondary | ICD-10-CM | POA: Insufficient documentation

## 2012-10-12 NOTE — Progress Notes (Signed)
Subjective:    Patient ID: Chad Delacruz, male    DOB: 03-15-1927, 79 y.o.   MRN: [id number]  HPI Chad Delacruz is seen today after call from [facility] that he had facial swelling with tenderness and leg swelling. He denies any facial pain, no shortness of breath or problems breathing. His legs are swelling. He does not elevate his legs during the day.   Chart reviewed: last 2 D echo July '13: Study Conclusions  - Left ventricle: The cavity size was normal. Systolic function was mildly reduced. The estimated ejection fraction was in the range of 45% to 50%. Hypokinesis of the inferoseptal myocardium. Doppler parameters are consistent with abnormal left ventricular relaxation (grade 1 diastolic dysfunction). - Aortic valve: Mild to moderate regurgitation. - Left atrium: The atrium was moderately to severely dilated. - Atrial septum: No defect or patent foramen ovale was Identified.  Last Bmet July '13 - Creatinine 1.1  Suspect he has venous insufficiency.  Past Medical History  Diagnosis Date  . HYPERLIPIDEMIA   . MITRAL REGURGITATION   . Unspecified essential hypertension   . MITRAL VALVE PROLAPSE   . ESOPHAGEAL STRICTURE   . PUD   . HIATAL HERNIA   . OSTEOARTHRITIS   . GASTROINTESTINAL HEMORRHAGE, HX OF   . BENIGN PROSTATIC HYPERTROPHY, HX OF    Past Surgical History  Procedure Laterality Date  . Hemorrhoid surgery    . Circumcision    . Mitral valve repair  05/07/2009    Dr. Cornelius Moras  . Hernia repair  03-31-12    BIH repair   . Hernia repair  03/2012   Family History  Problem Relation Age of Onset  . Hypertension Mother   . Hypertension Father    History   Social History  . Marital Status: Married    Spouse Name: N/A    Number of Children: N/A  . Years of Education: N/A   Occupational History  . Not on file.   Social History Main Topics  . Smoking status: Never Smoker   . Smokeless tobacco: Never Used  . Alcohol Use: No  . Drug Use: No  . Sexually Active: Not  Currently   Other Topics Concern  . Not on file   Social History Narrative   11th grade. Married 1950 - life sentence. 3 dtrs - '49, '50, '52; 2 sons - '51, '75. He had children by three women. 4 grandchildren. 1 great-grand.   He previously worked for [employer] 25 years, retired from that. Worked as a Nutritional therapist but totally retired in 2011. End of Life care - wishes full resuscitation and intensive treatment to include mechanical ventilation. Lives alone with wife and is independent in ADLs. Very limited in activity: no yard work.     \ Current Outpatient Prescriptions on File Prior to Visit  Medication Sig Dispense Refill  . acetaminophen (PAIN RELIEF) 500 MG tablet Take 500 mg by mouth every 6 (six) hours as needed. For pain      . atorvastatin (LIPITOR) 80 MG tablet Take 1 tablet (80 mg total) by mouth daily.  90 tablet  3  . citalopram (CELEXA) 20 MG tablet Take 1 tablet (20 mg total) by mouth daily.  30 tablet  11  . ezetimibe (ZETIA) 10 MG tablet Take 10 mg by mouth daily.      . furosemide (LASIX) 20 MG tablet Take 1 tablet (20 mg total) by mouth daily.  30 tablet  11  . metoprolol tartrate (LOPRESSOR) 25 MG  tablet Take 1 tablet (25 mg total) by mouth 2 (two) times daily.  180 tablet  3  . Multiple Vitamin (MULTIVITAMIN) tablet Take 1 tablet by mouth daily.        . QUEtiapine (SEROQUEL XR) 50 MG TB24 Take 1 tablet (50 mg total) by mouth at bedtime.  30 each  11  . Tamsulosin HCl (FLOMAX) 0.4 MG CAPS Take 0.4 mg by mouth daily after supper.       No current facility-administered medications on file prior to visit.       Review of Systems System review is negative for any constitutional, cardiac, pulmonary, GI or neuro symptoms or complaints other than as described in the HPI.      Objective:   Physical Exam Filed Vitals:   10/12/12 1646  BP: 108/60  Pulse: 59  Temp: 97.5 F (36.4 C)  Resp: 12   Wt Readings from Last 3 Encounters:  10/12/12 184 lb (83.462  kg)  07/19/12 173 lb 1.3 oz (78.509 kg)  04/21/12 178 lb (80.74 kg)   Gen'l - elderly AA man in no distress HEENT - no facial swelling, no facial tenderness. C&S cleart Cor - 2+ radial , RRR Pulm - no increased WOB, lungs are clear w/o rales Ext - 2+ swelling to just below the knee with the skin being shiny.        Assessment & Plan:

## 2012-10-12 NOTE — Assessment & Plan Note (Signed)
BP Readings from Last 3 Encounters:  10/12/12 108/60  08/05/12 128/84  07/29/12 131/78   Plan is to continue present medication with an increase in Lasix to 40 mg once a day.  Bmet to day.

## 2012-10-12 NOTE — Patient Instructions (Addendum)
Swelling in the legs - a combination of heart and circulation in the veins.  Plan Increase Lasix to 40 mg once a day  Use Knee high support hose - put on in the morning and take off at bedtime  Elevate legs for 15-30 minutes twice during the day.

## 2012-10-12 NOTE — Assessment & Plan Note (Signed)
For lab work today with recommendations on medication to follow

## 2012-10-12 NOTE — Assessment & Plan Note (Signed)
Stable per wife - he is at [facility] with memory care.

## 2012-10-12 NOTE — Assessment & Plan Note (Signed)
Swelling in the legs is a combination of a cardiomyopathy and venous insufficiency  Plan Increase Lasix to 40 mg daily  Use knee high support hose.  Elevate legs for 15-30 twice a day.

## 2012-10-13 LAB — HEPATIC FUNCTION PANEL
ALT: 21 U/L (ref 0–53)
AST: 25 U/L (ref 0–37)
Bilirubin, Direct: 0.2 mg/dL (ref 0.0–0.3)
Total Bilirubin: 1 mg/dL (ref 0.3–1.2)

## 2012-10-13 LAB — COMPREHENSIVE METABOLIC PANEL
Alkaline Phosphatase: 59 U/L (ref 39–117)
BUN: 30 mg/dL — ABNORMAL HIGH (ref 6–23)
Glucose, Bld: 87 mg/dL (ref 70–99)
Sodium: 139 mEq/L (ref 135–145)
Total Bilirubin: 1 mg/dL (ref 0.3–1.2)

## 2012-10-13 LAB — LIPID PANEL
Cholesterol: 126 mg/dL (ref 0–200)
HDL: 54.4 mg/dL (ref >39.00)

## 2012-10-17 ENCOUNTER — Encounter: Payer: Self-pay | Admitting: Cardiovascular Disease

## 2012-10-17 ENCOUNTER — Ambulatory Visit (INDEPENDENT_AMBULATORY_CARE_PROVIDER_SITE_OTHER): Payer: Medicare Other | Admitting: Cardiovascular Disease

## 2012-10-17 VITALS — BP 106/60 | HR 54 | Ht 69.0 in | Wt 180.0 lb

## 2012-10-17 DIAGNOSIS — I059 Rheumatic mitral valve disease, unspecified: Secondary | ICD-10-CM

## 2012-10-17 DIAGNOSIS — R609 Edema, unspecified: Secondary | ICD-10-CM

## 2012-10-17 MED ORDER — FUROSEMIDE 40 MG PO TABS: 40.0000 mg | ORAL_TABLET | Freq: Every day | ORAL | Status: DC

## 2012-10-17 NOTE — Patient Instructions (Addendum)
Your physician has recommended you make the following change in your medication: INCREASE Furosemide to 40mg  take one by mouth daily  Your physician wants you to follow-up in: 1 YEAR with Dr Excell Seltzer.  You will receive a reminder letter in the mail two months in advance. If you don't receive a letter, please call our office to schedule the follow-up appointment.

## 2012-10-17 NOTE — Progress Notes (Signed)
HPI:  79 year old gentleman presenting for followup evaluation. He has a history of long-standing severe mitral regurgitation and underwent mitral valve repair in 2010. He was noted to have nonobstructive CAD. An echocardiogram last year showed a left jugular ejection fraction of 45-50% with hypokinesis of the inferoseptal myocardium. His mitral valve repair was intact with normal function.  Since I have seen him last, he's moved into an Alzheimer[facility] care facility. He was just seen by Dr. Debby Bud and his furosemide was increased because of leg swelling. He's otherwise not had cardiac complaints. He denies chest pain, shortness of breath, palpitations, orthopnea, or PND. He continues to ambulate without a walker or cane. He has not had any falls.  Outpatient Encounter Prescriptions as of 10/17/2012  Medication Sig Dispense Refill  . acetaminophen (PAIN RELIEF) 500 MG tablet Take 500 mg by mouth every 6 (six) hours as needed. For pain      . atorvastatin (LIPITOR) 80 MG tablet Take 1 tablet (80 mg total) by mouth daily.  90 tablet  3  . citalopram (CELEXA) 20 MG tablet Take 1 tablet (20 mg total) by mouth daily.  30 tablet  11  . ezetimibe (ZETIA) 10 MG tablet Take 10 mg by mouth daily.      . furosemide (LASIX) 20 MG tablet Take 1 tablet (20 mg total) by mouth daily.  30 tablet  11  . metoprolol tartrate (LOPRESSOR) 25 MG tablet Take 1 tablet (25 mg total) by mouth 2 (two) times daily.  180 tablet  3  . Multiple Vitamin (MULTIVITAMIN) tablet Take 1 tablet by mouth daily.        . QUEtiapine (SEROQUEL XR) 50 MG TB24 Take 1 tablet (50 mg total) by mouth at bedtime.  30 each  11  . Tamsulosin HCl (FLOMAX) 0.4 MG CAPS Take 0.4 mg by mouth daily after supper.       No facility-administered encounter medications on file as of 10/17/2012.    Allergies  Allergen Reactions  . Aspirin Other (See Comments)    REACTION: BLEEDING    Past Medical History  Diagnosis Date  . HYPERLIPIDEMIA   . MITRAL  REGURGITATION   . Unspecified essential hypertension   . MITRAL VALVE PROLAPSE   . ESOPHAGEAL STRICTURE   . PUD   . HIATAL HERNIA   . OSTEOARTHRITIS   . GASTROINTESTINAL HEMORRHAGE, HX OF   . BENIGN PROSTATIC HYPERTROPHY, HX OF     ROS: Negative except as per HPI  BP 106/60  Pulse 54  Ht 5\' 9"  (1.753 m)  Wt 180 lb (81.647 kg)  BMI 26.57 kg/m2  PHYSICAL EXAM: Pt is alert, pleasant elderly male in NAD HEENT: normal Neck: JVP - normal, carotids 2+= without bruits Lungs: CTA bilaterally CV: RRR without murmur or gallop Abd: soft, NT, Positive BS, no hepatomegaly Ext: 1+ bilateral pretibial edema, distal pulses intact and equal Skin: warm/dry no rash  EKG:  Sinus bradycardia 54 beats per minute, for screening AV block, right atrial enlargement.  2-D echo 02/02/2012: Left ventricle: Asymmetrical septal hypertrophy. The cavity size was normal. Systolic function was mildly reduced. The estimated ejection fraction was in the range of 45% to 50%. Regional wall motion abnormalities: Hypokinesis of the inferoseptal myocardium. Doppler parameters are consistent with abnormal left ventricular relaxation (grade 1 diastolic dysfunction).  ------------------------------------------------------------ Aortic valve: Structurally normal valve. Trileaflet. Cusp separation was normal. Doppler: Transvalvular velocity was within the normal range. There was no stenosis. Mild to moderate regurgitation.  ------------------------------------------------------------ Aorta: The  aorta was normal, not dilated, and non-diseased.  ------------------------------------------------------------ Mitral valve: MV repair. Normal-sized annulus. Doppler: Trivial regurgitation. Indexed valve area by pressure half-time: 0.95cm^2/m^2. Indexed valve area by continuity equation (using LVOT flow): 0.63cm^2/m^2. Mean gradient: 5mm Hg (D). Peak gradient: 10mm Hg  (D).  ------------------------------------------------------------ Left atrium: The atrium was moderately to severely dilated.  ------------------------------------------------------------ Atrial septum: No defect or patent foramen ovale was identified.  ------------------------------------------------------------ Right ventricle: The cavity size was normal. Wall thickness was normal. Systolic function was normal.  ------------------------------------------------------------ Pulmonic valve: Structurally normal valve. Cusp separation was normal. Doppler: Transvalvular velocity was within the normal range. Trivial regurgitation.  ------------------------------------------------------------ Tricuspid valve: Structurally normal valve. Leaflet separation was normal. Doppler: Transvalvular velocity was within the normal range. Mild regurgitation.  ------------------------------------------------------------ Pulmonary artery: The main pulmonary artery was normal-sized.  ------------------------------------------------------------ Right atrium: The atrium was normal in size.  ------------------------------------------------------------ Pericardium: The pericardium was normal in appearance.  ASSESSMENT AND PLAN: 1. Mitral valve disease status post mitral valve repair. Repair is intact based on echocardiogram last year. His exam exhibits no murmur. He will continue on his current medical program.  2. Lower extremity edema. I agree this is probably multifactorial and related to cardiac disease as well as immobility and venous insufficiency. Agree with increase of furosemide to 40 mg daily and utilization of compression stockings and leg elevation.  For followup I will see him back in one year.  Tonny Bollman 10/17/2012 12:59 PM

## 2012-11-18 ENCOUNTER — Other Ambulatory Visit: Payer: Self-pay | Admitting: Cardiovascular Disease

## 2012-12-05 ENCOUNTER — Other Ambulatory Visit: Payer: Self-pay | Admitting: Cardiovascular Disease

## 2012-12-08 NOTE — Telephone Encounter (Signed)
Fax Received. Refill Completed. Chad Delacruz (R.M.A)   

## 2013-02-16 ENCOUNTER — Other Ambulatory Visit: Payer: Self-pay | Admitting: Cardiovascular Disease

## 2013-06-07 ENCOUNTER — Ambulatory Visit (INDEPENDENT_AMBULATORY_CARE_PROVIDER_SITE_OTHER): Payer: Medicare Other | Admitting: Nurse Practitioner

## 2013-06-07 ENCOUNTER — Encounter: Payer: Medicare Other | Admitting: Nurse Practitioner

## 2013-06-07 ENCOUNTER — Encounter: Payer: Self-pay | Admitting: Nurse Practitioner

## 2013-06-07 VITALS — BP 126/74 | HR 57 | Ht 69.0 in | Wt 190.8 lb

## 2013-06-07 DIAGNOSIS — Z0000 Encounter for general adult medical examination without abnormal findings: Secondary | ICD-10-CM

## 2013-06-07 NOTE — Patient Instructions (Addendum)
Today we reviewed your medications and updated your medical history. You should see Dr Arthur Holms in 3 months for routine physical and follow up of chronic conditions. Pleasure to meet you.

## 2013-06-07 NOTE — Progress Notes (Signed)
Subjective:     Chad Delacruz is a 79 y.o. male and is here for a Medicare Annual Wellness Visit. Chad Delacruz is currently treated for HTN, hyperlipidemia, PVD, and dementia. He lives at [facility] facility. He is accompanied by his daughter today.  She is concerned that she noticed Chad Delacruz scratching his neck a few days ago and wonders if he has a rash. Chad Delacruz has no cpmplaints. He has gained 10 pounds since moving to [place].  History   Social History  . Marital Status: Married    Spouse Name: N/A    Number of Children: N/A  . Years of Education: N/A   Occupational History  . Not on file.   Social History Main Topics  . Smoking status: Never Smoker   . Smokeless tobacco: Never Used  . Alcohol Use: No  . Drug Use: No  . Sexual Activity: Not Currently   Other Topics Concern  . Not on file   Social History Narrative   11th grade. Married 1950 - life sentence. 3 dtrs - '49, '50, '52; 2 sons - '51, '75. He had children by three women. 4 grandchildren. 1 great-grand.   He previously worked for [employer] 25 years, retired from that. Worked as a Nutritional therapist but totally retired in 2011. End of Life care - wishes full resuscitation and intensive treatment to include mechanical ventilation.      Lives at [facility] facility.    Health Maintenance  Topic Date Due  . Colonoscopy  02/09/1979  . Zostavax  02/08/1989  . Pneumococcal Polysaccharide Vaccine Age 23 And Over  02/08/1994  . Influenza Vaccine  02/17/2014  . Tetanus/tdap  07/29/2022    The following portions of the patient's history were reviewed and updated as appropriate: allergies, current medications, past family history, past medical history, past social history, past surgical history and problem list.  Review of Systems Constitutional: negative for chills, fatigue, night sweats, weight loss and has gained 10 pounds since moving to [place] Integument/breast: negative, daighter noticed him  scratching a few days ago.   Objective:    BP 126/74  Pulse 57  Ht 5\' 9"  (1.753 m)  Wt 190 lb 12 oz (86.524 kg)  BMI 28.16 kg/m2  SpO2 97% General appearance: alert, cooperative, appears stated age, no distress and slowed mentation Head: Normocephalic, without obvious abnormality, atraumatic Eyes: negative findings: lids and lashes normal and conjunctivae and sclerae normal Skin: Skin color, texture, turgor normal. No rashes or lesions or 1 papular lesion L side of neck-looks like insect bite, mildly erythematous.    Assessment:    Annual Wellness visit: Pt is now living at [facility] since last visit. No other changes in medical history Pt has documented dementia, is taking seroquel & celexa. Sleeping well. No behavioral concerns. BMI-overweight. 10 lb weight gain since moving to [place]. Next 5 yrs: monitor chronic conditions q46mo. Needs 2nd pneumonia vaccine, yearly CPE.     Plan:     See After Visit Summary for Counseling Recommendations

## 2013-06-07 NOTE — Progress Notes (Signed)
Pre visit review using our clinic review tool, if applicable. No additional management support is needed unless otherwise documented below in the visit note. 

## 2013-07-24 ENCOUNTER — Telehealth: Payer: Self-pay | Admitting: Internal Medicine

## 2013-07-24 NOTE — Telephone Encounter (Signed)
Jennifer notified.

## 2013-07-24 NOTE — Telephone Encounter (Signed)
Ok for PT eval and treat: fall risk due to poor balance

## 2013-07-24 NOTE — Telephone Encounter (Signed)
Anderson Malta called from [facility] home health request order from Mr. Kleve to have PT due to decrease muscle weakness and he is fall risk. Please advise.

## 2013-07-27 ENCOUNTER — Telehealth: Payer: Self-pay

## 2013-07-27 NOTE — Telephone Encounter (Signed)
Phone call from Lissa Morales physical therapist at [facility] [phone] would like verbal order to see patient 2 times a week for 2 weeks then 1 time for 1 week

## 2013-07-27 NOTE — Telephone Encounter (Signed)
K

## 2013-07-28 NOTE — Telephone Encounter (Signed)
Verbal okay given to one of the occupational therapist and message will be given to Lissa Morales.

## 2013-08-01 ENCOUNTER — Telehealth: Payer: Self-pay

## 2013-08-01 NOTE — Telephone Encounter (Signed)
Phone call from Flonnie Hailstone, occupational therapist with [facility] [phone] or cell [phone], states she evaluated patient and is requesting to see him 1x a week for 1 week, 2x a week for 3 weeks to work on safety awareness, coordination, strength, room organization, and sit to stand training.

## 2013-08-01 NOTE — Telephone Encounter (Signed)
VERBAL ok

## 2013-08-01 NOTE — Telephone Encounter (Signed)
Left verbal okay on office and cell numbers.

## 2013-08-07 DIAGNOSIS — G309 Alzheimer's disease, unspecified: Secondary | ICD-10-CM

## 2013-08-07 DIAGNOSIS — M6281 Muscle weakness (generalized): Secondary | ICD-10-CM

## 2013-08-07 DIAGNOSIS — F0280 Dementia in other diseases classified elsewhere without behavioral disturbance: Secondary | ICD-10-CM

## 2013-08-07 DIAGNOSIS — Z5189 Encounter for other specified aftercare: Secondary | ICD-10-CM

## 2013-08-18 ENCOUNTER — Telehealth: Payer: Self-pay

## 2013-08-18 NOTE — Telephone Encounter (Signed)
The nurse called and is hoping to get an antibiotic called in for the pt due to a boil.  He has an apt for Monday afternoon to be treated in the office.  Nurse with [facility] [phone]

## 2013-08-21 ENCOUNTER — Telehealth: Payer: Self-pay

## 2013-08-21 ENCOUNTER — Encounter: Payer: Self-pay | Admitting: Internal Medicine

## 2013-08-21 ENCOUNTER — Ambulatory Visit (INDEPENDENT_AMBULATORY_CARE_PROVIDER_SITE_OTHER): Payer: Medicare Other | Admitting: Internal Medicine

## 2013-08-21 VITALS — BP 130/80 | HR 66 | Temp 97.4°F | Wt 181.0 lb

## 2013-08-21 DIAGNOSIS — L02229 Furuncle of trunk, unspecified: Secondary | ICD-10-CM

## 2013-08-21 MED ORDER — DOXYCYCLINE HYCLATE 100 MG PO TABS: 100.0000 mg | ORAL_TABLET | Freq: Two times a day (BID) | ORAL | Status: DC

## 2013-08-21 NOTE — Progress Notes (Signed)
Subjective:    Patient ID: Chad Delacruz, male    DOB: [birth year], 80 y.o.   MRN: [id number]  HPI Mr. Yuriel presents with a 3 week history of a draining boil right mid-abdomen. Antibiotics had been request but family was asked to bring him in. He is demented and not a good historian. His wife and daughter only recently became aware that he had a problem. There is no report of fever but he does admit to pain. Dressing has been bandaide and gauze. No other treatment reported.  Past Medical History  Diagnosis Date  . HYPERLIPIDEMIA   . MITRAL REGURGITATION   . Unspecified essential hypertension   . MITRAL VALVE PROLAPSE   . ESOPHAGEAL STRICTURE   . PUD   . HIATAL HERNIA   . OSTEOARTHRITIS   . GASTROINTESTINAL HEMORRHAGE, HX OF   . BENIGN PROSTATIC HYPERTROPHY, HX OF    Past Surgical History  Procedure Laterality Date  . Hemorrhoid surgery    . Circumcision    . Mitral valve repair  05/07/2009    Dr. Roxy Manns  . Hernia repair  03-31-12    BIH repair   . Hernia repair  03/2012   Family History  Problem Relation Age of Onset  . Hypertension Mother   . Hypertension Father    History   Social History  . Marital Status: Married    Spouse Name: N/A    Number of Children: N/A  . Years of Education: N/A   Occupational History  . Not on file.   Social History Main Topics  . Smoking status: Never Smoker   . Smokeless tobacco: Never Used  . Alcohol Use: No  . Drug Use: No  . Sexual Activity: Not Currently   Other Topics Concern  . Not on file   Social History Narrative   11th grade. Married 1950 - life sentence. 3 dtrs - '49, '50, '52; 2 sons - '51, '75. He had children by three women. 4 grandchildren. 1 great-grand.   He previously worked for [employer] 25 years, retired from that. Worked as a Development worker, community but totally retired in 2011. End of Life care - wishes full resuscitation and intensive treatment to include mechanical ventilation.      Lives at [facility].     Current Outpatient Prescriptions on File Prior to Visit  Medication Sig Dispense Refill  . acetaminophen (PAIN RELIEF) 500 MG tablet Take 500 mg by mouth every 6 (six) hours as needed. For pain      . amLODipine (NORVASC) 5 MG tablet TAKE 1 TABLET (5 MG TOTAL) BY MOUTH DAILY.  90 tablet  2  . atorvastatin (LIPITOR) 80 MG tablet TAKE 1 TABLET (80 MG TOTAL) BY MOUTH DAILY.  90 tablet  3  . ezetimibe (ZETIA) 10 MG tablet Take 10 mg by mouth daily.      . furosemide (LASIX) 40 MG tablet Take 1 tablet (40 mg total) by mouth daily.  30 tablet  11  . lisinopril-hydrochlorothiazide (PRINZIDE,ZESTORETIC) 20-12.5 MG per tablet TAKE 1 TABLET BY MOUTH DAILY.  90 tablet  0  . metoprolol tartrate (LOPRESSOR) 25 MG tablet TAKE 1 TABLET (25 MG TOTAL) BY MOUTH 2 (TWO) TIMES DAILY.  180 tablet  2  . Multiple Vitamin (MULTIVITAMIN) tablet Take 1 tablet by mouth daily.        . QUEtiapine (SEROQUEL XR) 50 MG TB24 Take 1 tablet (50 mg total) by mouth at bedtime.  30 each  11  .  Tamsulosin HCl (FLOMAX) 0.4 MG CAPS Take 0.4 mg by mouth daily after supper.      . citalopram (CELEXA) 20 MG tablet Take 1 tablet (20 mg total) by mouth daily.  30 tablet  11   No current facility-administered medications on file prior to visit.      Review of Systems System review is negative for any constitutional, cardiac, pulmonary, GI or neuro symptoms or complaints other than as described in the HPI.     Objective:   Physical Exam Filed Vitals:   08/21/13 1639  BP: 130/80  Pulse: 66  Temp: 97.4 F (36.3 C)   Wt Readings from Last 3 Encounters:  08/21/13 181 lb (82.101 kg)  06/07/13 190 lb 12 oz (86.524 kg)  10/17/12 180 lb (81.647 kg)   Gen'l- Elderly man in no acute distress Cor- 2+ radial, RRR Pulm - normal respirations Neuro - speech is clear, memory not tested.  Derm - 5 cm diameter indurated lesion with fluctuant center with several small spontaneous openings with some purulent drainage.  I&D  - verbal consent obtained from family  anesth - 1% xylocaine with epi, infiltrated the edges of the lesion and then across  Prep - betadien  Incision - #11 scalpel, explored with curved hemostats, expressed 20 cc frankly purulent material, broke up adhesions.  Dressing - packed the wound with 1/2 inch iodoform gauze, covered with non-adherent dressing then 4x4 gauze and secured in place       Assessment & Plan:  Boil - anterior abdominal wall. S/p I&D and packing with iodoform  Plan Doxycycline 100 mg bid x 10 days  Return tomorrow, 08/22/13 for recheck and packing change.

## 2013-08-21 NOTE — Patient Instructions (Signed)
Large boil right anterior abdominal wall.  Had incision, drainage and packing with iodoform cause today.  Plan Doxycycline 100 mg twice a day for 10 days  Return to clinic tomorrow for change of packing.   Abscess An abscess is an infected area that contains a collection of pus and debris.It can occur in almost any part of the body. An abscess is also known as a furuncle or boil. CAUSES  An abscess occurs when tissue gets infected. This can occur from blockage of oil or sweat glands, infection of hair follicles, or a minor injury to the skin. As the body tries to fight the infection, pus collects in the area and creates pressure under the skin. This pressure causes pain. People with weakened immune systems have difficulty fighting infections and get certain abscesses more often.  SYMPTOMS Usually an abscess develops on the skin and becomes a painful mass that is red, warm, and tender. If the abscess forms under the skin, you may feel a moveable soft area under the skin. Some abscesses break open (rupture) on their own, but most will continue to get worse without care. The infection can spread deeper into the body and eventually into the bloodstream, causing you to feel ill.  DIAGNOSIS  Your caregiver will take your medical history and perform a physical exam. A sample of fluid may also be taken from the abscess to determine what is causing your infection. TREATMENT  Your caregiver may prescribe antibiotic medicines to fight the infection. However, taking antibiotics alone usually does not cure an abscess. Your caregiver may need to make a small cut (incision) in the abscess to drain the pus. In some cases, gauze is packed into the abscess to reduce pain and to continue draining the area. HOME CARE INSTRUCTIONS   Only take over-the-counter or prescription medicines for pain, discomfort, or fever as directed by your caregiver.  If you were prescribed antibiotics, take them as directed. Finish  them even if you start to feel better.  If gauze is used, follow your caregiver's directions for changing the gauze.  To avoid spreading the infection:  Keep your draining abscess covered with a bandage.  Wash your hands well.  Do not share personal care items, towels, or whirlpools with others.  Avoid skin contact with others.  Keep your skin and clothes clean around the abscess.  Keep all follow-up appointments as directed by your caregiver. SEEK MEDICAL CARE IF:   You have increased pain, swelling, redness, fluid drainage, or bleeding.  You have muscle aches, chills, or a general ill feeling.  You have a fever. MAKE SURE YOU:   Understand these instructions.  Will watch your condition.  Will get help right away if you are not doing well or get worse. Document Released: 04/15/2005 Document Revised: 01/05/2012 Document Reviewed: 09/18/2011 [facility] Patient Information 2014 [place]. Abscess Care After An abscess (also called a boil or furuncle) is an infected area that contains a collection of pus. Signs and symptoms of an abscess include pain, tenderness, redness, or hardness, or you may feel a moveable soft area under your skin. An abscess can occur anywhere in the body. The infection may spread to surrounding tissues causing cellulitis. A cut (incision) by the surgeon was made over your abscess and the pus was drained out. Gauze may have been packed into the space to provide a drain that will allow the cavity to heal from the inside outwards. The boil may be painful for 5 to 7 days.  Most people with a boil do not have high fevers. Your abscess, if seen early, may not have localized, and may not have been lanced. If not, another appointment may be required for this if it does not get better on its own or with medications. HOME CARE INSTRUCTIONS   Only take over-the-counter or prescription medicines for pain, discomfort, or fever as directed by your caregiver.  When  you bathe, soak and then remove gauze or iodoform packs at least daily or as directed by your caregiver. You may then wash the wound gently with mild soapy water. Repack with gauze or do as your caregiver directs. SEEK IMMEDIATE MEDICAL CARE IF:   You develop increased pain, swelling, redness, drainage, or bleeding in the wound site.  You develop signs of generalized infection including muscle aches, chills, fever, or a general ill feeling.  An oral temperature above 102 F (38.9 C) develops, not controlled by medication. See your caregiver for a recheck if you develop any of the symptoms described above. If medications (antibiotics) were prescribed, take them as directed. Document Released: 01/22/2005 Document Revised: 09/28/2011 Document Reviewed: 09/19/2007 [facility] Patient Information 2014 [place].

## 2013-08-21 NOTE — Telephone Encounter (Signed)
Has ov today.

## 2013-08-21 NOTE — Progress Notes (Signed)
Pre visit review using our clinic review tool, if applicable. No additional management support is needed unless otherwise documented below in the visit note. 

## 2013-08-21 NOTE — Telephone Encounter (Signed)
Per Dr Linda Hedges, please schedule patient tomorrow at 1:00. Thanks

## 2013-08-22 ENCOUNTER — Ambulatory Visit (INDEPENDENT_AMBULATORY_CARE_PROVIDER_SITE_OTHER): Payer: Medicare Other | Admitting: Internal Medicine

## 2013-08-22 DIAGNOSIS — Z5189 Encounter for other specified aftercare: Secondary | ICD-10-CM

## 2013-08-22 DIAGNOSIS — L02229 Furuncle of trunk, unspecified: Secondary | ICD-10-CM

## 2013-08-22 DIAGNOSIS — L02239 Carbuncle of trunk, unspecified: Secondary | ICD-10-CM

## 2013-08-22 NOTE — Telephone Encounter (Signed)
Spoke with his wife.  She will have him here today at 1:00.

## 2013-08-23 NOTE — Progress Notes (Signed)
   Subjective:    Patient ID: Chad Delacruz, male    DOB: 1927-04-07, 80 y.o.   MRN: [id number]  HPI Chad Delacruz present for wound check and dressing. He was seen yesterday of I&D of 5 cm boil right abdominal wall.   Since yesterday the dressing was removed and packing removed (probably by patient - who is demented). He has started doxycycline.   He has no distress.  PMH, FamHx and SocHx reviewed for any changes and relevance.  Current Outpatient Prescriptions on File Prior to Visit  Medication Sig Dispense Refill  . acetaminophen (PAIN RELIEF) 500 MG tablet Take 500 mg by mouth every 6 (six) hours as needed. For pain      . amLODipine (NORVASC) 5 MG tablet TAKE 1 TABLET (5 MG TOTAL) BY MOUTH DAILY.  90 tablet  2  . atorvastatin (LIPITOR) 80 MG tablet TAKE 1 TABLET (80 MG TOTAL) BY MOUTH DAILY.  90 tablet  3  . citalopram (CELEXA) 20 MG tablet Take 1 tablet (20 mg total) by mouth daily.  30 tablet  11  . doxycycline (VIBRA-TABS) 100 MG tablet Take 1 tablet (100 mg total) by mouth 2 (two) times daily.  20 tablet  0  . ezetimibe (ZETIA) 10 MG tablet Take 10 mg by mouth daily.      . furosemide (LASIX) 40 MG tablet Take 1 tablet (40 mg total) by mouth daily.  30 tablet  11  . lisinopril-hydrochlorothiazide (PRINZIDE,ZESTORETIC) 20-12.5 MG per tablet TAKE 1 TABLET BY MOUTH DAILY.  90 tablet  0  . metoprolol tartrate (LOPRESSOR) 25 MG tablet TAKE 1 TABLET (25 MG TOTAL) BY MOUTH 2 (TWO) TIMES DAILY.  180 tablet  2  . Multiple Vitamin (MULTIVITAMIN) tablet Take 1 tablet by mouth daily.        . QUEtiapine (SEROQUEL XR) 50 MG TB24 Take 1 tablet (50 mg total) by mouth at bedtime.  30 each  11  . Tamsulosin HCl (FLOMAX) 0.4 MG CAPS Take 0.4 mg by mouth daily after supper.       No current facility-administered medications on file prior to visit.      Review of Systems System review is negative for any constitutional, cardiac, pulmonary, GI or neuro symptoms or complaints other than as  described in the HPI.     Objective:   Physical Exam No vitals obtained Derm - abscess right abdominal wall with serosanguinous drainage with some pus.  Wound care - wound prepped with betadiene, local anesthesia with 2% xylocaine with epi. In a sterile fashion the wound was opened and explored with curved hemostats, repacked with iodoform cause and dressed with tefla pad covered with 4x4s taped into place.       Assessment & Plan:  Boil-wound care - as above. Called and spoke to director of Nursing at facility: asked that on his return a more secure tape be applied to dressing; continue doxycycline.  Plan Patient to return Friday, Feb 6th for wound change.

## 2013-08-25 ENCOUNTER — Ambulatory Visit (INDEPENDENT_AMBULATORY_CARE_PROVIDER_SITE_OTHER): Payer: Medicare Other | Admitting: Internal Medicine

## 2013-08-25 DIAGNOSIS — Z5189 Encounter for other specified aftercare: Secondary | ICD-10-CM

## 2013-08-25 DIAGNOSIS — L02229 Furuncle of trunk, unspecified: Secondary | ICD-10-CM

## 2013-08-25 DIAGNOSIS — L02239 Carbuncle of trunk, unspecified: Secondary | ICD-10-CM

## 2013-08-28 NOTE — Progress Notes (Signed)
   Subjective:    Patient ID: Chad Delacruz, male    DOB: 12-29-1926, 80 y.o.   MRN: [id number]  HPI Chad Delacruz presents for wound care and packing change of an abdominal wall abscess - visit #3. He has been doing well, no fever, chills or other systemic signs of infection. He has been taking doxycycline as instructed.  PMH, FamHx and SocHx reviewed for any changes and relevance. Current Outpatient Prescriptions on File Prior to Visit  Medication Sig Dispense Refill  . acetaminophen (PAIN RELIEF) 500 MG tablet Take 500 mg by mouth every 6 (six) hours as needed. For pain      . amLODipine (NORVASC) 5 MG tablet TAKE 1 TABLET (5 MG TOTAL) BY MOUTH DAILY.  90 tablet  2  . atorvastatin (LIPITOR) 80 MG tablet TAKE 1 TABLET (80 MG TOTAL) BY MOUTH DAILY.  90 tablet  3  . citalopram (CELEXA) 20 MG tablet Take 1 tablet (20 mg total) by mouth daily.  30 tablet  11  . doxycycline (VIBRA-TABS) 100 MG tablet Take 1 tablet (100 mg total) by mouth 2 (two) times daily.  20 tablet  0  . ezetimibe (ZETIA) 10 MG tablet Take 10 mg by mouth daily.      . furosemide (LASIX) 40 MG tablet Take 1 tablet (40 mg total) by mouth daily.  30 tablet  11  . lisinopril-hydrochlorothiazide (PRINZIDE,ZESTORETIC) 20-12.5 MG per tablet TAKE 1 TABLET BY MOUTH DAILY.  90 tablet  0  . metoprolol tartrate (LOPRESSOR) 25 MG tablet TAKE 1 TABLET (25 MG TOTAL) BY MOUTH 2 (TWO) TIMES DAILY.  180 tablet  2  . Multiple Vitamin (MULTIVITAMIN) tablet Take 1 tablet by mouth daily.        . QUEtiapine (SEROQUEL XR) 50 MG TB24 Take 1 tablet (50 mg total) by mouth at bedtime.  30 each  11  . Tamsulosin HCl (FLOMAX) 0.4 MG CAPS Take 0.4 mg by mouth daily after supper.       No current facility-administered medications on file prior to visit.      Review of Systems System review is negative for any constitutional, cardiac, pulmonary, GI or neuro symptoms or complaints other than as described in the HPI.     Objective:   Physical Exam No  vitals obtained. Patient is not febrile by handtouch Cor- RRR Pulm - RRR Derm - boil right abdomin above the level of the umbilicus: dressing removed; packing was in place and subsequently removed. There was not heat to the wound, no purulent drainage. At the base of the wound there is good granulation tissue.  Wound care: after betadine prep the wound was carefully repacked with less iodoform gauze, covered with tefla non-stick pad and covered with 4x4 taped in place. At the facility they will secure with dressing tape.       Assessment & Plan:  Boil abdomen - steady improvement.  PLan - patient will return 2/10 for last wound check.  Continue doxycycline.

## 2013-08-29 ENCOUNTER — Encounter: Payer: Self-pay | Admitting: Internal Medicine

## 2013-08-29 ENCOUNTER — Ambulatory Visit: Payer: Medicare Other | Admitting: Internal Medicine

## 2013-08-29 VITALS — BP 128/88 | HR 59 | Wt 178.4 lb

## 2013-08-29 DIAGNOSIS — L02229 Furuncle of trunk, unspecified: Secondary | ICD-10-CM

## 2013-08-29 DIAGNOSIS — Z5189 Encounter for other specified aftercare: Secondary | ICD-10-CM

## 2013-08-29 NOTE — Progress Notes (Signed)
Pre visit review using our clinic review tool, if applicable. No additional management support is needed unless otherwise documented below in the visit note. 

## 2013-08-30 NOTE — Progress Notes (Signed)
   Subjective:    Patient ID: Chad Delacruz, male    DOB: [birth year], 80 y.o.   MRN: [id number]  HPI Mr. Sotomayor returns for wound care for boil right abdominal wall - 4th visit. He has done well: no fever or chills reported; he does not complain of pain.  PMH, FamHx and SocHx reviewed for any changes and relevance.  Current Outpatient Prescriptions on File Prior to Visit  Medication Sig Dispense Refill  . acetaminophen (PAIN RELIEF) 500 MG tablet Take 500 mg by mouth every 6 (six) hours as needed. For pain      . amLODipine (NORVASC) 5 MG tablet TAKE 1 TABLET (5 MG TOTAL) BY MOUTH DAILY.  90 tablet  2  . atorvastatin (LIPITOR) 80 MG tablet TAKE 1 TABLET (80 MG TOTAL) BY MOUTH DAILY.  90 tablet  3  . doxycycline (VIBRA-TABS) 100 MG tablet Take 1 tablet (100 mg total) by mouth 2 (two) times daily.  20 tablet  0  . ezetimibe (ZETIA) 10 MG tablet Take 10 mg by mouth daily.      . furosemide (LASIX) 40 MG tablet Take 1 tablet (40 mg total) by mouth daily.  30 tablet  11  . lisinopril-hydrochlorothiazide (PRINZIDE,ZESTORETIC) 20-12.5 MG per tablet TAKE 1 TABLET BY MOUTH DAILY.  90 tablet  0  . metoprolol tartrate (LOPRESSOR) 25 MG tablet TAKE 1 TABLET (25 MG TOTAL) BY MOUTH 2 (TWO) TIMES DAILY.  180 tablet  2  . Multiple Vitamin (MULTIVITAMIN) tablet Take 1 tablet by mouth daily.        . QUEtiapine (SEROQUEL XR) 50 MG TB24 Take 1 tablet (50 mg total) by mouth at bedtime.  30 each  11  . Tamsulosin HCl (FLOMAX) 0.4 MG CAPS Take 0.4 mg by mouth daily after supper.      . citalopram (CELEXA) 20 MG tablet Take 1 tablet (20 mg total) by mouth daily.  30 tablet  11   No current facility-administered medications on file prior to visit.      Review of Systems System review is negative for any constitutional, cardiac, pulmonary, GI or neuro symptoms or complaints other than as described in the HPI.     Objective:   Physical Exam Filed Vitals:   08/29/13 0824  BP: 128/88  Pulse: 39   Gen'l-  elderly man in no distress Derm - dressing removed from wound, packing removed. The wound is 2cm wide, 1 cm deep. Good granulation tissue at base of wound, no exudate. Surrounding tissue a bit indurated, no warm or tender.  No repacking. Neosporin and large bandaide applied.       Assessment & Plan:  Boil - healing well. Plan Wash daily with soap and water then dry  Apply neosporin ointment and bandaide  Return for inflammation, purulent drainage or pain.

## 2014-02-01 NOTE — Telephone Encounter (Signed)
Close Encounter 

## 2014-04-10 ENCOUNTER — Other Ambulatory Visit (HOSPITAL_COMMUNITY): Payer: Self-pay | Admitting: Urology

## 2014-04-10 DIAGNOSIS — C61 Malignant neoplasm of prostate: Secondary | ICD-10-CM

## 2014-04-23 ENCOUNTER — Encounter (HOSPITAL_COMMUNITY)
Admission: RE | Admit: 2014-04-23 | Discharge: 2014-04-23 | Disposition: A | Discharge status: Home / Self Care | Payer: Medicare Other | Source: Ambulatory Visit | Attending: Urology | Admitting: Urology

## 2014-04-23 DIAGNOSIS — C61 Malignant neoplasm of prostate: Secondary | ICD-10-CM | POA: Insufficient documentation

## 2014-04-23 MED ORDER — TECHNETIUM TC 99M MEDRONATE IV KIT
26.3000 | PACK | Freq: Once | INTRAVENOUS | Status: AC | PRN
  Administered 2014-04-23: 26.3 via INTRAVENOUS

## 2014-07-26 DIAGNOSIS — G10 Huntington's disease: Secondary | ICD-10-CM | POA: Diagnosis not present

## 2014-07-26 DIAGNOSIS — R609 Edema, unspecified: Secondary | ICD-10-CM | POA: Diagnosis not present

## 2014-07-26 DIAGNOSIS — I429 Cardiomyopathy, unspecified: Secondary | ICD-10-CM | POA: Diagnosis not present

## 2014-07-26 DIAGNOSIS — M1990 Unspecified osteoarthritis, unspecified site: Secondary | ICD-10-CM | POA: Diagnosis not present

## 2014-07-26 DIAGNOSIS — E559 Vitamin D deficiency, unspecified: Secondary | ICD-10-CM | POA: Diagnosis not present

## 2014-08-23 DIAGNOSIS — K219 Gastro-esophageal reflux disease without esophagitis: Secondary | ICD-10-CM | POA: Diagnosis not present

## 2014-08-23 DIAGNOSIS — G10 Huntington's disease: Secondary | ICD-10-CM | POA: Diagnosis not present

## 2014-08-23 DIAGNOSIS — R609 Edema, unspecified: Secondary | ICD-10-CM | POA: Diagnosis not present

## 2014-08-23 DIAGNOSIS — I429 Cardiomyopathy, unspecified: Secondary | ICD-10-CM | POA: Diagnosis not present

## 2014-08-23 DIAGNOSIS — E559 Vitamin D deficiency, unspecified: Secondary | ICD-10-CM | POA: Diagnosis not present

## 2014-09-05 ENCOUNTER — Other Ambulatory Visit: Payer: Self-pay | Admitting: Geriatric Medicine

## 2014-09-05 MED ORDER — METOPROLOL TARTRATE 25 MG PO TABS: ORAL_TABLET | ORAL | Status: DC

## 2014-09-28 DIAGNOSIS — E559 Vitamin D deficiency, unspecified: Secondary | ICD-10-CM | POA: Diagnosis not present

## 2014-09-28 DIAGNOSIS — E785 Hyperlipidemia, unspecified: Secondary | ICD-10-CM | POA: Diagnosis not present

## 2014-09-28 DIAGNOSIS — G10 Huntington's disease: Secondary | ICD-10-CM | POA: Diagnosis not present

## 2014-09-28 DIAGNOSIS — M1990 Unspecified osteoarthritis, unspecified site: Secondary | ICD-10-CM | POA: Diagnosis not present

## 2014-10-03 DIAGNOSIS — E785 Hyperlipidemia, unspecified: Secondary | ICD-10-CM | POA: Diagnosis not present

## 2014-10-03 DIAGNOSIS — N189 Chronic kidney disease, unspecified: Secondary | ICD-10-CM | POA: Diagnosis not present

## 2014-10-25 DIAGNOSIS — I10 Essential (primary) hypertension: Secondary | ICD-10-CM | POA: Diagnosis not present

## 2014-10-25 DIAGNOSIS — M1990 Unspecified osteoarthritis, unspecified site: Secondary | ICD-10-CM | POA: Diagnosis not present

## 2014-10-25 DIAGNOSIS — G10 Huntington's disease: Secondary | ICD-10-CM | POA: Diagnosis not present

## 2014-10-25 DIAGNOSIS — K219 Gastro-esophageal reflux disease without esophagitis: Secondary | ICD-10-CM | POA: Diagnosis not present

## 2014-11-04 DIAGNOSIS — N400 Enlarged prostate without lower urinary tract symptoms: Secondary | ICD-10-CM | POA: Diagnosis not present

## 2014-11-05 DIAGNOSIS — N400 Enlarged prostate without lower urinary tract symptoms: Secondary | ICD-10-CM | POA: Diagnosis not present

## 2014-11-08 DIAGNOSIS — G309 Alzheimer's disease, unspecified: Secondary | ICD-10-CM | POA: Diagnosis not present

## 2014-11-08 DIAGNOSIS — C61 Malignant neoplasm of prostate: Secondary | ICD-10-CM | POA: Diagnosis not present

## 2014-11-22 DIAGNOSIS — I10 Essential (primary) hypertension: Secondary | ICD-10-CM | POA: Diagnosis not present

## 2014-11-22 DIAGNOSIS — I429 Cardiomyopathy, unspecified: Secondary | ICD-10-CM | POA: Diagnosis not present

## 2014-11-22 DIAGNOSIS — M1990 Unspecified osteoarthritis, unspecified site: Secondary | ICD-10-CM | POA: Diagnosis not present

## 2014-11-22 DIAGNOSIS — G10 Huntington's disease: Secondary | ICD-10-CM | POA: Diagnosis not present

## 2015-01-12 ENCOUNTER — Emergency Department (HOSPITAL_COMMUNITY)
Admission: EM | Admit: 2015-01-12 | Discharge: 2015-01-12 | Disposition: A | Discharge status: Home / Self Care | Payer: Medicare Other | Attending: Emergency Medicine | Admitting: Emergency Medicine

## 2015-01-12 ENCOUNTER — Encounter (HOSPITAL_COMMUNITY): Payer: Self-pay | Admitting: Nurse Practitioner

## 2015-01-12 DIAGNOSIS — Y9289 Other specified places as the place of occurrence of the external cause: Secondary | ICD-10-CM | POA: Diagnosis not present

## 2015-01-12 DIAGNOSIS — S0990XA Unspecified injury of head, initial encounter: Secondary | ICD-10-CM | POA: Diagnosis present

## 2015-01-12 DIAGNOSIS — S0003XA Contusion of scalp, initial encounter: Secondary | ICD-10-CM

## 2015-01-12 DIAGNOSIS — M1990 Unspecified osteoarthritis, unspecified site: Secondary | ICD-10-CM | POA: Diagnosis not present

## 2015-01-12 DIAGNOSIS — W01198A Fall on same level from slipping, tripping and stumbling with subsequent striking against other object, initial encounter: Secondary | ICD-10-CM | POA: Diagnosis not present

## 2015-01-12 DIAGNOSIS — Y998 Other external cause status: Secondary | ICD-10-CM | POA: Diagnosis not present

## 2015-01-12 DIAGNOSIS — Z8711 Personal history of peptic ulcer disease: Secondary | ICD-10-CM | POA: Diagnosis not present

## 2015-01-12 DIAGNOSIS — Z87438 Personal history of other diseases of male genital organs: Secondary | ICD-10-CM | POA: Insufficient documentation

## 2015-01-12 DIAGNOSIS — Z792 Long term (current) use of antibiotics: Secondary | ICD-10-CM | POA: Insufficient documentation

## 2015-01-12 DIAGNOSIS — Z79899 Other long term (current) drug therapy: Secondary | ICD-10-CM | POA: Diagnosis not present

## 2015-01-12 DIAGNOSIS — F0390 Unspecified dementia without behavioral disturbance: Secondary | ICD-10-CM | POA: Diagnosis not present

## 2015-01-12 DIAGNOSIS — Y9301 Activity, walking, marching and hiking: Secondary | ICD-10-CM | POA: Diagnosis not present

## 2015-01-12 DIAGNOSIS — I10 Essential (primary) hypertension: Secondary | ICD-10-CM | POA: Insufficient documentation

## 2015-01-12 DIAGNOSIS — Z8719 Personal history of other diseases of the digestive system: Secondary | ICD-10-CM | POA: Diagnosis not present

## 2015-01-12 DIAGNOSIS — W010XXA Fall on same level from slipping, tripping and stumbling without subsequent striking against object, initial encounter: Secondary | ICD-10-CM

## 2015-01-12 NOTE — ED Provider Notes (Signed)
CSN: [id number]     Arrival date & time 01/12/15  2024 History   First MD Initiated Contact with Patient 01/12/15 2103     Chief Complaint  Patient presents with  . Fall     (Consider location/radiation/quality/duration/timing/severity/associated sxs/prior Treatment) Patient is a 81 y.o. male presenting with fall. The history is provided by the patient, a relative and the EMS personnel. The history is limited by the condition of the patient.  Fall  Patient w hx dementia, s/p fall at [facility] tonight. Per report, pt walking w walker, when he tripped over walker and fell forward. Bumped head. No loc. Pt has been acting c/w baseline since fall. No vomiting. Pt denies pain. Pt very limited historian given dementia - level 5 caveat. No anticoagulant medication use. Family reports recent health at baseline, w no other recent falls.      Past Medical History  Diagnosis Date  . HYPERLIPIDEMIA   . MITRAL REGURGITATION   . Unspecified essential hypertension   . MITRAL VALVE PROLAPSE   . ESOPHAGEAL STRICTURE   . PUD   . HIATAL HERNIA   . OSTEOARTHRITIS   . GASTROINTESTINAL HEMORRHAGE, HX OF   . BENIGN PROSTATIC HYPERTROPHY, HX OF    Past Surgical History  Procedure Laterality Date  . Hemorrhoid surgery    . Circumcision    . Mitral valve repair  05/07/2009    Dr. Roxy Manns  . Hernia repair  03-31-12    BIH repair   . Hernia repair  03/2012   Family History  Problem Relation Age of Onset  . Hypertension Mother   . Hypertension Father    History  Substance Use Topics  . Smoking status: Never Smoker   . Smokeless tobacco: Never Used  . Alcohol Use: No       Review of Systems  Unable to perform ROS: Dementia  Constitutional: Negative for fever.  level 5 caveat, advanced dementia.     Allergies  Aspirin  Home Medications   Prior to Admission medications   Medication Sig Start Date End Date Taking? Authorizing Provider  acetaminophen (PAIN RELIEF) 500 MG tablet Take 500 mg by  mouth every 6 (six) hours as needed. For pain    Historical Provider, MD  amLODipine (NORVASC) 5 MG tablet TAKE 1 TABLET (5 MG TOTAL) BY MOUTH DAILY. 12/05/12   Sherren Mocha, MD  atorvastatin (LIPITOR) 80 MG tablet TAKE 1 TABLET (80 MG TOTAL) BY MOUTH DAILY. 02/16/13   Sherren Mocha, MD  citalopram (CELEXA) 20 MG tablet Take 1 tablet (20 mg total) by mouth daily. 02/18/12 02/17/13  Neena Rhymes, MD  doxycycline (VIBRA-TABS) 100 MG tablet Take 1 tablet (100 mg total) by mouth 2 (two) times daily. 08/21/13   Neena Rhymes, MD  ezetimibe (ZETIA) 10 MG tablet Take 10 mg by mouth daily.    Historical Provider, MD  furosemide (LASIX) 40 MG tablet Take 1 tablet (40 mg total) by mouth daily. 10/17/12 10/17/13  Sherren Mocha, MD  lisinopril-hydrochlorothiazide (PRINZIDE,ZESTORETIC) 20-12.5 MG per tablet TAKE 1 TABLET BY MOUTH DAILY. 11/18/12   Sherren Mocha, MD  metoprolol tartrate (LOPRESSOR) 25 MG tablet TAKE 1 TABLET (25 MG TOTAL) BY MOUTH 2 (TWO) TIMES DAILY. 09/05/14   Olga Millers, MD  Multiple Vitamin (MULTIVITAMIN) tablet Take 1 tablet by mouth daily.      Historical Provider, MD  QUEtiapine (SEROQUEL XR) 50 MG TB24 Take 1 tablet (50 mg total) by mouth at bedtime. 08/27/12   Heinz Knuckles Norins,  MD  Tamsulosin HCl (FLOMAX) 0.4 MG CAPS Take 0.4 mg by mouth daily after supper. 02/17/12   Michael Boston, MD   BP 177/71 mmHg  Pulse 63  Temp(Src) 98.1 F (36.7 C) (Oral)  Resp 20  SpO2 97% Physical Exam  Constitutional: He appears well-developed and well-nourished. No distress.  HENT:  Mouth/Throat: Oropharynx is clear and moist.  Minimal contusion to right anterior scalp.   Eyes: EOM are normal. Pupils are equal, round, and reactive to light.  Neck: Normal range of motion. Neck supple. No tracheal deviation present.  Cardiovascular: Normal rate, regular rhythm, normal heart sounds and intact distal pulses.   Pulmonary/Chest: Effort normal and breath sounds normal. No accessory muscle usage. No  respiratory distress. He exhibits no tenderness.  Abdominal: Soft. Bowel sounds are normal. He exhibits no distension and no mass. There is no tenderness. There is no rebound and no guarding.  Musculoskeletal: Normal range of motion. He exhibits no edema or tenderness.  Good rom bil extremities without pain or focal bony tenderness. Distal pulses palp bil. CTLS spine, non tender, aligned, no step off.   Neurological: He is alert.  Awake and alert, confused, c/w baseline. Family reports pts mental status as being c/w baseline. Moves bil extremities purposefully, with good strength, and without apparent discomfort.   Skin: Skin is warm and dry. He is not diaphoretic.  Psychiatric: He has a normal mood and affect.  Nursing note and vitals reviewed.   ED Course  Procedures (including critical care time) Labs Review     MDM   Reviewed nursing notes and prior charts for additional history.   Family present, and indicates pts mental status has remained at baseline since fall, and is currently at baseline.  No vomiting. No loc. Minimal contusion to scalp on exam.  Pt currently appears stable for d/c. Fall precautions. Return precautions provided.     Lajean Saver, MD 01/12/15 2121

## 2015-01-12 NOTE — ED Notes (Signed)
Pt is presented from [facility],[id number] ) for further evaluation after a witnessed fall with reported head impact. Also report of pt in early stage of dementia, PANAID assessment reveals no pain at this time, pt is also denying pain.

## 2015-01-12 NOTE — Discharge Instructions (Signed)
It was our pleasure to provide your ER care today - we hope that you feel better.  Fall precautions.   Follow up with primary care doctor in the coming week.  Return to ER if worse, new symptoms, fevers, change in mental status, severe pain, persistent vomiting, other concern.     Facial or Scalp Contusion A facial or scalp contusion is a deep bruise on the face or head. Injuries to the face and head generally cause a lot of swelling, especially around the eyes. Contusions are the result of an injury that caused bleeding under the skin. The contusion may turn blue, purple, or yellow. Minor injuries will give you a painless contusion, but more severe contusions may stay painful and swollen for a few weeks.  CAUSES  A facial or scalp contusion is caused by a blunt injury or trauma to the face or head area.  SIGNS AND SYMPTOMS   Swelling of the injured area.   Discoloration of the injured area.   Tenderness, soreness, or pain in the injured area.  DIAGNOSIS  The diagnosis can be made by taking a medical history and doing a physical exam. An X-ray exam, CT scan, or MRI may be needed to determine if there are any associated injuries, such as broken bones (fractures). TREATMENT  Often, the best treatment for a facial or scalp contusion is applying cold compresses to the injured area. Over-the-counter medicines may also be recommended for pain control.  HOME CARE INSTRUCTIONS   Only take over-the-counter or prescription medicines as directed by your health care provider.   Apply ice to the injured area.   Put ice in a plastic bag.   Place a towel between your skin and the bag.   Leave the ice on for 20 minutes, 2-3 times a day.  SEEK MEDICAL CARE IF:  You have bite problems.   You have pain with chewing.   You are concerned about facial defects. SEEK IMMEDIATE MEDICAL CARE IF:  You have severe pain or a headache that is not relieved by medicine.   You have unusual  sleepiness, confusion, or personality changes.   You throw up (vomit).   You have a persistent nosebleed.   You have double vision or blurred vision.   You have fluid drainage from your nose or ear.   You have difficulty walking or using your arms or legs.  MAKE SURE YOU:   Understand these instructions.  Will watch your condition.  Will get help right away if you are not doing well or get worse. Document Released: 08/13/2004 Document Revised: 04/26/2013 Document Reviewed: 02/16/2013 [facility] Patient Information 2015 [place], [place]. This information is not intended to replace advice given to you by your health care provider. Make sure you discuss any questions you have with your health care provider.     Fall Prevention and Home Safety Falls cause injuries and can affect all age groups. It is possible to use preventive measures to significantly decrease the likelihood of falls. There are many simple measures which can make your home safer and prevent falls. OUTDOORS  Repair cracks and edges of walkways and driveways.  Remove high doorway thresholds.  Trim shrubbery on the main path into your home.  Have good outside lighting.  Clear walkways of tools, rocks, debris, and clutter.  Check that handrails are not broken and are securely fastened. Both sides of steps should have handrails.  Have leaves, snow, and ice cleared regularly.  Use sand or salt on  walkways during winter months.  In the garage, clean up grease or oil spills. BATHROOM  Install night lights.  Install grab bars by the toilet and in the tub and shower.  Use non-skid mats or decals in the tub or shower.  Place a plastic non-slip stool in the shower to sit on, if needed.  Keep floors dry and clean up all water on the floor immediately.  Remove soap buildup in the tub or shower on a regular basis.  Secure bath mats with non-slip, double-sided rug tape.  Remove throw rugs and tripping  hazards from the floors. BEDROOMS  Install night lights.  Make sure a bedside light is easy to reach.  Do not use oversized bedding.  Keep a telephone by your bedside.  Have a firm chair with side arms to use for getting dressed.  Remove throw rugs and tripping hazards from the floor. KITCHEN  Keep handles on pots and pans turned toward the center of the stove. Use back burners when possible.  Clean up spills quickly and allow time for drying.  Avoid walking on wet floors.  Avoid hot utensils and knives.  Position shelves so they are not too high or low.  Place commonly used objects within easy reach.  If necessary, use a sturdy step stool with a grab bar when reaching.  Keep electrical cables out of the way.  Do not use floor polish or wax that makes floors slippery. If you must use wax, use non-skid floor wax.  Remove throw rugs and tripping hazards from the floor. STAIRWAYS  Never leave objects on stairs.  Place handrails on both sides of stairways and use them. Fix any loose handrails. Make sure handrails on both sides of the stairways are as long as the stairs.  Check carpeting to make sure it is firmly attached along stairs. Make repairs to worn or loose carpet promptly.  Avoid placing throw rugs at the top or bottom of stairways, or properly secure the rug with carpet tape to prevent slippage. Get rid of throw rugs, if possible.  Have an electrician put in a light switch at the top and bottom of the stairs. OTHER FALL PREVENTION TIPS  Wear low-heel or rubber-soled shoes that are supportive and fit well. Wear closed toe shoes.  When using a stepladder, make sure it is fully opened and both spreaders are firmly locked. Do not climb a closed stepladder.  Add color or contrast paint or tape to grab bars and handrails in your home. Place contrasting color strips on first and last steps.  Learn and use mobility aids as needed. Install an electrical emergency  response system.  Turn on lights to avoid dark areas. Replace light bulbs that burn out immediately. Get light switches that glow.  Arrange furniture to create clear pathways. Keep furniture in the same place.  Firmly attach carpet with non-skid or double-sided tape.  Eliminate uneven floor surfaces.  Select a carpet pattern that does not visually hide the edge of steps.  Be aware of all pets. OTHER HOME SAFETY TIPS  Set the water temperature for 120 F (48.8 C).  Keep emergency numbers on or near the telephone.  Keep smoke detectors on every level of the home and near sleeping areas. Document Released: 06/26/2002 Document Revised: 01/05/2012 Document Reviewed: 09/25/2011 [facility] Patient Information 2015 [place], [place]. This information is not intended to replace advice given to you by your health care provider. Make sure you discuss any questions you have with your  health care provider.

## 2015-01-12 NOTE — ED Notes (Signed)
Bed: [facility] Expected date:  Expected time:  Means of arrival:  Comments: EMS 81yo m, Fall

## 2015-04-16 ENCOUNTER — Emergency Department (HOSPITAL_COMMUNITY): Payer: Medicare Other

## 2015-04-16 ENCOUNTER — Emergency Department (HOSPITAL_COMMUNITY)
Admission: EM | Admit: 2015-04-16 | Discharge: 2015-04-16 | Disposition: A | Discharge status: Skilled Nursing Facility | Payer: Medicare Other | Attending: Emergency Medicine | Admitting: Emergency Medicine

## 2015-04-16 ENCOUNTER — Encounter (HOSPITAL_COMMUNITY): Payer: Self-pay | Admitting: *Deleted

## 2015-04-16 DIAGNOSIS — Y92129 Unspecified place in nursing home as the place of occurrence of the external cause: Secondary | ICD-10-CM | POA: Diagnosis not present

## 2015-04-16 DIAGNOSIS — Z8719 Personal history of other diseases of the digestive system: Secondary | ICD-10-CM | POA: Insufficient documentation

## 2015-04-16 DIAGNOSIS — S0990XA Unspecified injury of head, initial encounter: Secondary | ICD-10-CM | POA: Diagnosis present

## 2015-04-16 DIAGNOSIS — S0003XA Contusion of scalp, initial encounter: Secondary | ICD-10-CM | POA: Diagnosis not present

## 2015-04-16 DIAGNOSIS — M1990 Unspecified osteoarthritis, unspecified site: Secondary | ICD-10-CM | POA: Diagnosis not present

## 2015-04-16 DIAGNOSIS — S8001XA Contusion of right knee, initial encounter: Secondary | ICD-10-CM | POA: Insufficient documentation

## 2015-04-16 DIAGNOSIS — Z8711 Personal history of peptic ulcer disease: Secondary | ICD-10-CM | POA: Insufficient documentation

## 2015-04-16 DIAGNOSIS — I10 Essential (primary) hypertension: Secondary | ICD-10-CM | POA: Diagnosis not present

## 2015-04-16 DIAGNOSIS — Y998 Other external cause status: Secondary | ICD-10-CM | POA: Insufficient documentation

## 2015-04-16 DIAGNOSIS — E785 Hyperlipidemia, unspecified: Secondary | ICD-10-CM | POA: Insufficient documentation

## 2015-04-16 DIAGNOSIS — Z87438 Personal history of other diseases of male genital organs: Secondary | ICD-10-CM | POA: Insufficient documentation

## 2015-04-16 DIAGNOSIS — Z79899 Other long term (current) drug therapy: Secondary | ICD-10-CM | POA: Diagnosis not present

## 2015-04-16 DIAGNOSIS — I341 Nonrheumatic mitral (valve) prolapse: Secondary | ICD-10-CM | POA: Insufficient documentation

## 2015-04-16 DIAGNOSIS — Y9301 Activity, walking, marching and hiking: Secondary | ICD-10-CM | POA: Diagnosis not present

## 2015-04-16 DIAGNOSIS — W19XXXA Unspecified fall, initial encounter: Secondary | ICD-10-CM

## 2015-04-16 DIAGNOSIS — F0390 Unspecified dementia without behavioral disturbance: Secondary | ICD-10-CM | POA: Diagnosis not present

## 2015-04-16 DIAGNOSIS — W01198A Fall on same level from slipping, tripping and stumbling with subsequent striking against other object, initial encounter: Secondary | ICD-10-CM | POA: Diagnosis not present

## 2015-04-16 DIAGNOSIS — S0081XA Abrasion of other part of head, initial encounter: Secondary | ICD-10-CM | POA: Insufficient documentation

## 2015-04-16 HISTORY — DX: Unspecified dementia, unspecified severity, without behavioral disturbance, psychotic disturbance, mood disturbance, and anxiety: F03.90

## 2015-04-16 NOTE — ED Notes (Signed)
Patient denies pain and is resting comfortably.  

## 2015-04-16 NOTE — Discharge Instructions (Signed)
Chad Delacruz had imaging of his head, neck, and right knee today.     Contusion A contusion is a deep bruise. Contusions are the result of an injury that caused bleeding under the skin. The contusion may turn blue, purple, or yellow. Minor injuries will give you a painless contusion, but more severe contusions may stay painful and swollen for a few weeks.  CAUSES  A contusion is usually caused by a blow, trauma, or direct force to an area of the body. SYMPTOMS   Swelling and redness of the injured area.  Bruising of the injured area.  Tenderness and soreness of the injured area.  Pain. DIAGNOSIS  The diagnosis can be made by taking a history and physical exam. An X-ray, CT scan, or MRI may be needed to determine if there were any associated injuries, such as fractures. TREATMENT  Specific treatment will depend on what area of the body was injured. In general, the best treatment for a contusion is resting, icing, elevating, and applying cold compresses to the injured area. Over-the-counter medicines may also be recommended for pain control. Ask your caregiver what the best treatment is for your contusion. HOME CARE INSTRUCTIONS   Put ice on the injured area.  Put ice in a plastic bag.  Place a towel between your skin and the bag.  Leave the ice on for 15-20 minutes, 3-4 times a day, or as directed by your health care provider.  Only take over-the-counter or prescription medicines for pain, discomfort, or fever as directed by your caregiver. Your caregiver may recommend avoiding anti-inflammatory medicines (aspirin, ibuprofen, and naproxen) for 48 hours because these medicines may increase bruising.  Rest the injured area.  If possible, elevate the injured area to reduce swelling. SEEK IMMEDIATE MEDICAL CARE IF:   You have increased bruising or swelling.  You have pain that is getting worse.  Your swelling or pain is not relieved with medicines. MAKE SURE YOU:   Understand  these instructions.  Will watch your condition.  Will get help right away if you are not doing well or get worse. Document Released: 04/15/2005 Document Revised: 07/11/2013 Document Reviewed: 05/11/2011 [facility] Patient Information 2015 [place], [place]. This information is not intended to replace advice given to you by your health care provider. Make sure you discuss any questions you have with your health care provider.  Fall Prevention and Home Safety Falls cause injuries and can affect all age groups. It is possible to use preventive measures to significantly decrease the likelihood of falls. There are many simple measures which can make your home safer and prevent falls. OUTDOORS  Repair cracks and edges of walkways and driveways.  Remove high doorway thresholds.  Trim shrubbery on the main path into your home.  Have good outside lighting.  Clear walkways of tools, rocks, debris, and clutter.  Check that handrails are not broken and are securely fastened. Both sides of steps should have handrails.  Have leaves, snow, and ice cleared regularly.  Use sand or salt on walkways during winter months.  In the garage, clean up grease or oil spills. BATHROOM  Install night lights.  Install grab bars by the toilet and in the tub and shower.  Use non-skid mats or decals in the tub or shower.  Place a plastic non-slip stool in the shower to sit on, if needed.  Keep floors dry and clean up all water on the floor immediately.  Remove soap buildup in the tub or shower on a regular  basis.  Secure bath mats with non-slip, double-sided rug tape.  Remove throw rugs and tripping hazards from the floors. BEDROOMS  Install night lights.  Make sure a bedside light is easy to reach.  Do not use oversized bedding.  Keep a telephone by your bedside.  Have a firm chair with side arms to use for getting dressed.  Remove throw rugs and tripping hazards from the  floor. KITCHEN  Keep handles on pots and pans turned toward the center of the stove. Use back burners when possible.  Clean up spills quickly and allow time for drying.  Avoid walking on wet floors.  Avoid hot utensils and knives.  Position shelves so they are not too high or low.  Place commonly used objects within easy reach.  If necessary, use a sturdy step stool with a grab bar when reaching.  Keep electrical cables out of the way.  Do not use floor polish or wax that makes floors slippery. If you must use wax, use non-skid floor wax.  Remove throw rugs and tripping hazards from the floor. STAIRWAYS  Never leave objects on stairs.  Place handrails on both sides of stairways and use them. Fix any loose handrails. Make sure handrails on both sides of the stairways are as long as the stairs.  Check carpeting to make sure it is firmly attached along stairs. Make repairs to worn or loose carpet promptly.  Avoid placing throw rugs at the top or bottom of stairways, or properly secure the rug with carpet tape to prevent slippage. Get rid of throw rugs, if possible.  Have an electrician put in a light switch at the top and bottom of the stairs. OTHER FALL PREVENTION TIPS  Wear low-heel or rubber-soled shoes that are supportive and fit well. Wear closed toe shoes.  When using a stepladder, make sure it is fully opened and both spreaders are firmly locked. Do not climb a closed stepladder.  Add color or contrast paint or tape to grab bars and handrails in your home. Place contrasting color strips on first and last steps.  Learn and use mobility aids as needed. Install an electrical emergency response system.  Turn on lights to avoid dark areas. Replace light bulbs that burn out immediately. Get light switches that glow.  Arrange furniture to create clear pathways. Keep furniture in the same place.  Firmly attach carpet with non-skid or double-sided tape.  Eliminate uneven  floor surfaces.  Select a carpet pattern that does not visually hide the edge of steps.  Be aware of all pets. OTHER HOME SAFETY TIPS  Set the water temperature for 120 F (48.8 C).  Keep emergency numbers on or near the telephone.  Keep smoke detectors on every level of the home and near sleeping areas. Document Released: 06/26/2002 Document Revised: 01/05/2012 Document Reviewed: 09/25/2011 [facility] Patient Information 2015 [place], [place]. This information is not intended to replace advice given to you by your health care provider. Make sure you discuss any questions you have with your health care provider.

## 2015-04-16 NOTE — ED Notes (Signed)
Bed: [facility] Expected date:  Expected time:  Means of arrival:  Comments: EMS- 81yo M, trip and fall/no thinners/Hx of dementia

## 2015-04-16 NOTE — ED Notes (Signed)
Patient comes from [place] with c/o fall.  Staff at facility indicated patient did not lose consciousness.  Patient has small abrasion to right frontal area.  Patient has dementia and is unable to recall fall.  Patient is moving all extremities and not complaining of pain.

## 2015-04-16 NOTE — ED Notes (Signed)
Per EMS - patient comes from [facility] unit.  Patient tripped on another patient's walker earlier today and fell forward, hitting head his head on a hard object (exact object unknown).  No LOC, patient is not on anticoagulants.  Patient passed c-spine clearance in field, but is in a collar because of his dementia.  Patient has small abrasion to right frontal area.  Patient's last BP 138/98, HR 80, RR 16.  CBG 276.

## 2015-04-16 NOTE — ED Notes (Signed)
Patient ambulated well without assistance.

## 2015-04-16 NOTE — ED Provider Notes (Signed)
CSN: [id number]     Arrival date & time 04/16/15  1152 History   First MD Initiated Contact with Patient 04/16/15 1300     Chief Complaint  Patient presents with  . Fall     Patient is a 81 y.o. male presenting with fall. The history is provided by the patient and the EMS personnel. No language interpreter was used.  Fall   Chad Delacruz presents for evaluation of injuries following a fall.  Level V caveat due to dementia. Report per EMS is that he was walking and tripped over another patient's walker and fell forward, hitting his head.  No LOC.  Pt denies any pain in the emergency department.  Not on anticoagulants.    Past Medical History  Diagnosis Date  . HYPERLIPIDEMIA   . MITRAL REGURGITATION   . Unspecified essential hypertension   . MITRAL VALVE PROLAPSE   . ESOPHAGEAL STRICTURE   . PUD   . HIATAL HERNIA   . OSTEOARTHRITIS   . GASTROINTESTINAL HEMORRHAGE, HX OF   . BENIGN PROSTATIC HYPERTROPHY, HX OF   . Dementia    Past Surgical History  Procedure Laterality Date  . Hemorrhoid surgery    . Circumcision    . Mitral valve repair  05/07/2009    Dr. Roxy Manns  . Hernia repair  03-31-12    BIH repair   . Hernia repair  03/2012   Family History  Problem Relation Age of Onset  . Hypertension Mother   . Hypertension Father    Social History  Substance Use Topics  . Smoking status: Never Smoker   . Smokeless tobacco: Never Used  . Alcohol Use: No    Review of Systems  All other systems reviewed and are negative.     Allergies  Aspirin  Home Medications   Prior to Admission medications   Medication Sig Start Date End Date Taking? Authorizing Provider  acetaminophen (PAIN RELIEF) 500 MG tablet Take 500 mg by mouth 3 (three) times daily. For pain   Yes Historical Provider, MD  ALPRAZolam (XANAX) 0.25 MG tablet Take 0.25 mg by mouth 2 (two) times daily as needed for anxiety.   Yes Historical Provider, MD  amLODipine (NORVASC) 5 MG tablet TAKE 1 TABLET (5 MG TOTAL)  BY MOUTH DAILY. 12/05/12  Yes Sherren Mocha, MD  atorvastatin (LIPITOR) 40 MG tablet Take 40 mg by mouth at bedtime. 12/24/14  Yes Historical Provider, MD  cholecalciferol (VITAMIN D) 1000 UNITS tablet Take 1,000 Units by mouth daily.   Yes Historical Provider, MD  citalopram (CELEXA) 20 MG tablet Take 20 mg by mouth daily. 01/07/15  Yes Historical Provider, MD  furosemide (LASIX) 40 MG tablet Take 40 mg by mouth daily. 11/28/14  Yes Historical Provider, MD  guaifenesin (ROBITUSSIN) 100 MG/5ML syrup Take 200 mg by mouth 4 (four) times daily as needed for cough.   Yes Historical Provider, MD  lisinopril (PRINIVIL,ZESTRIL) 20 MG tablet Take 20 mg by mouth daily. 12/18/14  Yes Historical Provider, MD  metoprolol tartrate (LOPRESSOR) 25 MG tablet TAKE 1 TABLET (25 MG TOTAL) BY MOUTH 2 (TWO) TIMES DAILY. Patient taking differently: Take 12.5 mg by mouth 2 (two) times daily.  09/05/14  Yes Hoyt Koch, MD  NUTRITIONAL SUPPLEMENTS PO Take 1 each by mouth 2 (two) times daily.   Yes Historical Provider, MD  QUEtiapine (SEROQUEL) 25 MG tablet Take 25 mg by mouth daily. 12/24/14  Yes Historical Provider, MD  atorvastatin (LIPITOR) 80 MG tablet TAKE 1 TABLET (  80 MG TOTAL) BY MOUTH DAILY. Patient not taking: Reported on 01/12/2015 02/16/13   Sherren Mocha, MD  lisinopril-hydrochlorothiazide (PRINZIDE,ZESTORETIC) 20-12.5 MG per tablet TAKE 1 TABLET BY MOUTH DAILY. Patient not taking: Reported on 01/12/2015 11/18/12   Sherren Mocha, MD  QUEtiapine (SEROQUEL XR) 50 MG TB24 Take 1 tablet (50 mg total) by mouth at bedtime. Patient not taking: Reported on 01/12/2015 08/27/12   Neena Rhymes, MD   BP 156/76 mmHg  Pulse 64  Temp(Src) 97.7 F (36.5 C) (Oral)  SpO2 93% Physical Exam  Constitutional: He appears well-developed and well-nourished.  HENT:  Head: Normocephalic.  Small abrasion over right upper forehead.   Eyes: Pupils are equal, round, and reactive to light.  Neck:  No discrete c spine tenderness.   Cardiovascular: Normal rate and regular rhythm.   No murmur heard. Pulmonary/Chest: Effort normal and breath sounds normal. No respiratory distress.  Abdominal: Soft. There is no tenderness. There is no rebound and no guarding.  Musculoskeletal: He exhibits no edema or tenderness.  No discrete bony tenderness over arms, legs.   Neurological: He is alert.  Disoriented to place and time.  [name] symmetrically .  Skin: Skin is warm and dry.  Psychiatric: He has a normal mood and affect. His behavior is normal.  Nursing note and vitals reviewed.   ED Course  Procedures (including critical care time) Labs Review Labs Reviewed - No data to display  Imaging Review Ct Head Wo Contrast  04/16/2015   CLINICAL DATA:  Patient tripped and fell today, striking head. Right frontal abrasion. C collar in place. Initial encounter.  EXAM: CT HEAD WITHOUT CONTRAST  CT CERVICAL SPINE WITHOUT CONTRAST  TECHNIQUE: Multidetector CT imaging of the head and cervical spine was performed following the standard protocol without intravenous contrast. Multiplanar CT image reconstructions of the cervical spine were also generated.  COMPARISON:  Head CT 08/05/2011.  FINDINGS: CT HEAD FINDINGS  There is no evidence of acute intracranial hemorrhage, mass lesion, brain edema or extra-axial fluid collection. Compared with the prior study, there is progressive dilatation of the ventricles and enlargement of the subarachnoid spaces consistent with progressive atrophy. There is new encephalomalacia within the left parietal subcortical white matter. Mild chronic small vessel ischemic changes in the periventricular white matter are stable. There is no evidence of acute cortical based infarct. Prominent intra vascular calcifications are noted.  The visualized paranasal sinuses, mastoid air cells and middle ears are otherwise clear. The calvarium is intact.  CT CERVICAL SPINE FINDINGS  The cervical alignment is near anatomic. There is  straightening with a grade 1 degenerative anterolisthesis at C4-5. No evidence of acute fracture or traumatic subluxation. There is probable ankylosis across the C2-3 disc and both facet joints. There is also probable ankylosis across the right C4-5 facet joint. Uncinate spurring and facet disease contribute to foraminal narrowing, greatest on the left at C3-4. No acute soft tissue findings identified.  IMPRESSION: 1. No acute intracranial or calvarial findings. 2. Progressive atrophy compared with prior CT from 2013. New left parietal encephalomalacia. 3. No evidence of acute cervical spine fracture, traumatic subluxation or static signs of instability. Multilevel spondylosis and ankylosis as described.   Electronically Signed   By: Richardean Sale M.D.   On: 04/16/2015 14:28   Ct Cervical Spine Wo Contrast  04/16/2015   CLINICAL DATA:  Patient tripped and fell today, striking head. Right frontal abrasion. C collar in place. Initial encounter.  EXAM: CT HEAD WITHOUT CONTRAST  CT CERVICAL SPINE  WITHOUT CONTRAST  TECHNIQUE: Multidetector CT imaging of the head and cervical spine was performed following the standard protocol without intravenous contrast. Multiplanar CT image reconstructions of the cervical spine were also generated.  COMPARISON:  Head CT 08/05/2011.  FINDINGS: CT HEAD FINDINGS  There is no evidence of acute intracranial hemorrhage, mass lesion, brain edema or extra-axial fluid collection. Compared with the prior study, there is progressive dilatation of the ventricles and enlargement of the subarachnoid spaces consistent with progressive atrophy. There is new encephalomalacia within the left parietal subcortical white matter. Mild chronic small vessel ischemic changes in the periventricular white matter are stable. There is no evidence of acute cortical based infarct. Prominent intra vascular calcifications are noted.  The visualized paranasal sinuses, mastoid air cells and middle ears are  otherwise clear. The calvarium is intact.  CT CERVICAL SPINE FINDINGS  The cervical alignment is near anatomic. There is straightening with a grade 1 degenerative anterolisthesis at C4-5. No evidence of acute fracture or traumatic subluxation. There is probable ankylosis across the C2-3 disc and both facet joints. There is also probable ankylosis across the right C4-5 facet joint. Uncinate spurring and facet disease contribute to foraminal narrowing, greatest on the left at C3-4. No acute soft tissue findings identified.  IMPRESSION: 1. No acute intracranial or calvarial findings. 2. Progressive atrophy compared with prior CT from 2013. New left parietal encephalomalacia. 3. No evidence of acute cervical spine fracture, traumatic subluxation or static signs of instability. Multilevel spondylosis and ankylosis as described.   Electronically Signed   By: Richardean Sale M.D.   On: 04/16/2015 14:28   Dg Knee Complete 4 Views Right  04/16/2015   CLINICAL DATA:  Pain following fall  EXAM: RIGHT KNEE - COMPLETE 4+ VIEW  COMPARISON:  None.  FINDINGS: Frontal, bilateral oblique, and lateral views obtained. There is no demonstrable fracture or dislocation. There is a moderate knee joint effusion. There is severe narrowing medially with milder narrowing laterally and in the patellofemoral joint region. There is spurring in all compartments. There is subchondral cystic change in the medial tibial plateau. No erosive change. There is mild chondrocalcinosis.  IMPRESSION: Osteoarthritic change, most marked medially. Moderate joint effusion. No acute fracture. Mild chondrocalcinosis may be secondary to osteoarthritis but also may be indicative of underlying calcium pyrophosphate deposition disease.   Electronically Signed   By: Lowella Grip III M.D.   On: 04/16/2015 14:30   I have personally reviewed and evaluated these images and lab results as part of my medical decision-making.   EKG Interpretation None       MDM   Final diagnoses:  Fall, initial encounter  Scalp contusion, initial encounter  Knee contusion, right, initial encounter    Patient here for evaluation following a mechanical fall. History and examination is limited due to dementia. Patient has no evidence of acute intracranial abnormality or acute fracture/dislocation on imaging and examination. Patient is able to ambulate near his baseline in the emergency department. Discussed with patient's daughter findings of studies, plan for outpatient follow-up, return precautions.    Chad Reichert, MD 04/16/15 1650

## 2015-06-10 ENCOUNTER — Encounter (HOSPITAL_COMMUNITY): Payer: Self-pay | Admitting: Emergency Medicine

## 2015-06-10 ENCOUNTER — Emergency Department (HOSPITAL_COMMUNITY): Payer: Medicare Other

## 2015-06-10 ENCOUNTER — Emergency Department (HOSPITAL_COMMUNITY)
Admission: EM | Admit: 2015-06-10 | Discharge: 2015-06-10 | Disposition: A | Discharge status: Home / Self Care | Payer: Medicare Other | Attending: Emergency Medicine | Admitting: Emergency Medicine

## 2015-06-10 DIAGNOSIS — E785 Hyperlipidemia, unspecified: Secondary | ICD-10-CM | POA: Insufficient documentation

## 2015-06-10 DIAGNOSIS — G309 Alzheimer's disease, unspecified: Secondary | ICD-10-CM | POA: Insufficient documentation

## 2015-06-10 DIAGNOSIS — Z87438 Personal history of other diseases of male genital organs: Secondary | ICD-10-CM | POA: Diagnosis not present

## 2015-06-10 DIAGNOSIS — Z79899 Other long term (current) drug therapy: Secondary | ICD-10-CM | POA: Diagnosis not present

## 2015-06-10 DIAGNOSIS — F0280 Dementia in other diseases classified elsewhere without behavioral disturbance: Secondary | ICD-10-CM | POA: Insufficient documentation

## 2015-06-10 DIAGNOSIS — Z043 Encounter for examination and observation following other accident: Secondary | ICD-10-CM | POA: Insufficient documentation

## 2015-06-10 DIAGNOSIS — M1990 Unspecified osteoarthritis, unspecified site: Secondary | ICD-10-CM | POA: Diagnosis not present

## 2015-06-10 DIAGNOSIS — Z8711 Personal history of peptic ulcer disease: Secondary | ICD-10-CM | POA: Diagnosis not present

## 2015-06-10 DIAGNOSIS — Y9389 Activity, other specified: Secondary | ICD-10-CM | POA: Insufficient documentation

## 2015-06-10 DIAGNOSIS — Z8719 Personal history of other diseases of the digestive system: Secondary | ICD-10-CM | POA: Insufficient documentation

## 2015-06-10 DIAGNOSIS — W01198A Fall on same level from slipping, tripping and stumbling with subsequent striking against other object, initial encounter: Secondary | ICD-10-CM | POA: Diagnosis not present

## 2015-06-10 DIAGNOSIS — F0390 Unspecified dementia without behavioral disturbance: Secondary | ICD-10-CM | POA: Diagnosis present

## 2015-06-10 DIAGNOSIS — Y998 Other external cause status: Secondary | ICD-10-CM | POA: Insufficient documentation

## 2015-06-10 DIAGNOSIS — Y9289 Other specified places as the place of occurrence of the external cause: Secondary | ICD-10-CM | POA: Insufficient documentation

## 2015-06-10 DIAGNOSIS — I059 Rheumatic mitral valve disease, unspecified: Secondary | ICD-10-CM | POA: Insufficient documentation

## 2015-06-10 DIAGNOSIS — I10 Essential (primary) hypertension: Secondary | ICD-10-CM | POA: Diagnosis not present

## 2015-06-10 NOTE — ED Notes (Signed)
Patient transported to CT 

## 2015-06-10 NOTE — Discharge Instructions (Signed)
Continue taking your medications as prescribed. Follow-up with your primary care provider this week. Return to the emergency department if symptoms worsen or new onset of fever, change in mental status, change in behavior, fever, weakness, vomiting.

## 2015-06-10 NOTE — ED Provider Notes (Signed)
CSN: [id number]     Arrival date & time 06/10/15  1200 History   First MD Initiated Contact with Patient 06/10/15 1233     Chief Complaint  Patient presents with  . Dementia     (Consider location/radiation/quality/duration/timing/severity/associated sxs/prior Treatment) HPI   Patient is a 81 year old male with past medical history of Alzheimer's who presents to the ED via EMS with complaint of fall, onset prior to arrival. EMS report patient is from [facility] and his facility reported him having an aggressive episode where he was swinging his walker in the air. EMS reports staff gave patient 0.25 mg of Ativan PO prior to transportation. Facility also reports during the aggressive episode patient fell backwards and hit his head, denies LOC. Patient is not on any blood thinners. Patient denies any complaints at this time. Patient's daughter is at bedside and reports he has at his baseline mental status. She reports the patient's dementia has been worsening over the past year and he has had multiple falls over the past few months. She notes she spoke with the director of [facility], [place] and is planning on meeting with her tomorrow regarding possible placement in a dementia unit in [place].  Past Medical History  Diagnosis Date  . HYPERLIPIDEMIA   . MITRAL REGURGITATION   . Unspecified essential hypertension   . MITRAL VALVE PROLAPSE   . ESOPHAGEAL STRICTURE   . PUD   . HIATAL HERNIA   . OSTEOARTHRITIS   . GASTROINTESTINAL HEMORRHAGE, HX OF   . BENIGN PROSTATIC HYPERTROPHY, HX OF   . Dementia    Past Surgical History  Procedure Laterality Date  . Hemorrhoid surgery    . Circumcision    . Mitral valve repair  05/07/2009    Dr. Roxy Manns  . Hernia repair  03-31-12    BIH repair   . Hernia repair  03/2012   Family History  Problem Relation Age of Onset  . Hypertension Mother   . Hypertension Father    Social History  Substance Use Topics  . Smoking status: Never Smoker   .  Smokeless tobacco: Never Used  . Alcohol Use: No    Review of Systems  Unable to perform ROS: Dementia      Allergies  Aspirin  Home Medications   Prior to Admission medications   Medication Sig Start Date End Date Taking? Authorizing Provider  acetaminophen (PAIN RELIEF) 500 MG tablet Take 500 mg by mouth 3 (three) times daily. For pain   Yes Historical Provider, MD  ALPRAZolam (XANAX) 0.25 MG tablet Take 0.25 mg by mouth 2 (two) times daily as needed for anxiety.   Yes Historical Provider, MD  amLODipine (NORVASC) 5 MG tablet TAKE 1 TABLET (5 MG TOTAL) BY MOUTH DAILY. 12/05/12  Yes Sherren Mocha, MD  atorvastatin (LIPITOR) 40 MG tablet Take 40 mg by mouth daily.   Yes Historical Provider, MD  cholecalciferol (VITAMIN D) 1000 UNITS tablet Take 1,000 Units by mouth daily.   Yes Historical Provider, MD  citalopram (CELEXA) 20 MG tablet Take 20 mg by mouth daily. 01/07/15  Yes Historical Provider, MD  furosemide (LASIX) 40 MG tablet Take 40 mg by mouth daily. 11/28/14  Yes Historical Provider, MD  guaifenesin (ROBITUSSIN) 100 MG/5ML syrup Take 200 mg by mouth every 6 (six) hours as needed for cough.    Yes Historical Provider, MD  lisinopril (PRINIVIL,ZESTRIL) 20 MG tablet Take 20 mg by mouth daily. 12/18/14  Yes Historical Provider, MD  metoprolol tartrate (LOPRESSOR) 25  MG tablet TAKE 1 TABLET (25 MG TOTAL) BY MOUTH 2 (TWO) TIMES DAILY. Patient taking differently: Take 12.5 mg by mouth 2 (two) times daily.  09/05/14  Yes Hoyt Koch, MD  NUTRITIONAL SUPPLEMENTS PO Take 1 each by mouth 2 (two) times daily.   Yes Historical Provider, MD  QUEtiapine (SEROQUEL) 25 MG tablet Take 25 mg by mouth daily. 12/24/14  Yes Historical Provider, MD  atorvastatin (LIPITOR) 80 MG tablet TAKE 1 TABLET (80 MG TOTAL) BY MOUTH DAILY. Patient not taking: Reported on 06/10/2015 02/16/13   Sherren Mocha, MD  lisinopril-hydrochlorothiazide (PRINZIDE,ZESTORETIC) 20-12.5 MG per tablet TAKE 1 TABLET BY MOUTH  DAILY. Patient not taking: Reported on 01/12/2015 11/18/12   Sherren Mocha, MD  QUEtiapine (SEROQUEL XR) 50 MG TB24 Take 1 tablet (50 mg total) by mouth at bedtime. Patient not taking: Reported on 06/10/2015 08/27/12   Neena Rhymes, MD   BP 110/80 mmHg  Pulse 58  Temp(Src) 98.4 F (36.9 C) (Oral)  Resp 18  SpO2 99% Physical Exam  Constitutional: He appears well-developed and well-nourished.  Pt resting in bed comfortably.  HENT:  Head: Normocephalic and atraumatic. Head is without raccoon's eyes, without Battle's sign, without abrasion, without contusion and without laceration.  Right Ear: Tympanic membrane normal.  Left Ear: Tympanic membrane normal.  Nose: Nose normal. No nose lacerations or sinus tenderness. Right sinus exhibits no maxillary sinus tenderness and no frontal sinus tenderness. Left sinus exhibits no maxillary sinus tenderness and no frontal sinus tenderness.  Mouth/Throat: Uvula is midline, oropharynx is clear and moist and mucous membranes are normal. No oropharyngeal exudate.  Eyes: Conjunctivae and EOM are normal. Pupils are equal, round, and reactive to light. Right eye exhibits no discharge. Left eye exhibits no discharge. No scleral icterus.  Neck: Normal range of motion. Neck supple.  Cardiovascular: Normal rate, regular rhythm, normal heart sounds and intact distal pulses.   Pulmonary/Chest: Effort normal and breath sounds normal. No respiratory distress. He has no wheezes. He has no rales. He exhibits no tenderness.  Abdominal: Soft. Bowel sounds are normal. He exhibits no distension and no mass. There is no tenderness. There is no rebound and no guarding.  Musculoskeletal: Normal range of motion. He exhibits no edema or tenderness.  Lymphadenopathy:    He has no cervical adenopathy.  Neurological: He is alert. He has normal strength. No cranial nerve deficit or sensory deficit. Coordination normal.  Oriented to person only, daughter reports this is pt's  baseline. Pt able to follow commands.  Skin: Skin is warm and dry.  No contusions, abrasions, lacerations.  Nursing note and vitals reviewed.   ED Course  Procedures (including critical care time) Labs Review Labs Reviewed - No data to display  Imaging Review Ct Head Wo Contrast  06/10/2015  CLINICAL DATA:  Fall EXAM: CT HEAD WITHOUT CONTRAST TECHNIQUE: Contiguous axial images were obtained from the base of the skull through the vertex without intravenous contrast. COMPARISON:  04/16/2015 FINDINGS: Stable global atrophy. Chronic ischemic changes in the periventricular white matter. Left parietal encephalomalacia is stable. No mass effect, midline shift, or acute hemorrhage. Mastoid air cells are clear. Intact cranium IMPRESSION: No acute intracranial pathology. Electronically Signed   By: Marybelle Killings M.D.   On: 06/10/2015 13:59   I have personally reviewed and evaluated these images and lab results as part of my medical decision-making.  Filed Vitals:   06/10/15 1208 06/10/15 1507  BP: 104/74 110/80  Pulse: 55 58  Temp: 98.3 F (36.8  C) 98.4 F (36.9 C)  Resp: 18 18     MDM   Final diagnoses:  Alzheimer's dementia    Patient presents from [facility] s/p fall. Facility reports pt was having aggressive behavior where he was swinging his walker in the air which resulted in him falling backwards and hitting his head. Hx of alzehimers dementia. Denies LOC. Patient on any blood thinners. Pt denies any complaint at this time. Pt is at baseline mental status per daughter. VSS. Exam unremarkable. No evidence of trauma or injury. Patient is only alert to self (his baseline). Patient able to follow commands. CT head showed no acute findings. Discussed results and plan for discharge with patient and daughter. Daughter reports she is planning on meeting with facility director tomorrow regarding placement at a dementia facility in [place].  Evaluation does not show pathology requring  ongoing emergent intervention or admission. Pt is hemodynamically stable and mentating appropriately. Discussed findings/results and plan with patient/guardian, who agrees with plan. All questions answered. Return precautions discussed and outpatient follow up given.      Chesley Noon [place], [place] 06/10/15 [place], MD 06/10/15 1537
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
## 2015-06-10 NOTE — Progress Notes (Signed)
Pt is from [facility] which was [facility].  Listed on pt forms from facility Cm noted pcp is "[facility]"

## 2015-06-14 ENCOUNTER — Emergency Department (HOSPITAL_COMMUNITY): Payer: Medicare Other

## 2015-06-14 ENCOUNTER — Encounter (HOSPITAL_COMMUNITY): Payer: Self-pay | Admitting: Emergency Medicine

## 2015-06-14 ENCOUNTER — Emergency Department (HOSPITAL_COMMUNITY)
Admission: EM | Admit: 2015-06-14 | Discharge: 2015-06-14 | Disposition: A | Discharge status: Home / Self Care | Payer: Medicare Other | Attending: Emergency Medicine | Admitting: Emergency Medicine

## 2015-06-14 DIAGNOSIS — Y998 Other external cause status: Secondary | ICD-10-CM | POA: Insufficient documentation

## 2015-06-14 DIAGNOSIS — Z8739 Personal history of other diseases of the musculoskeletal system and connective tissue: Secondary | ICD-10-CM | POA: Insufficient documentation

## 2015-06-14 DIAGNOSIS — I10 Essential (primary) hypertension: Secondary | ICD-10-CM | POA: Insufficient documentation

## 2015-06-14 DIAGNOSIS — Z8711 Personal history of peptic ulcer disease: Secondary | ICD-10-CM | POA: Insufficient documentation

## 2015-06-14 DIAGNOSIS — F0390 Unspecified dementia without behavioral disturbance: Secondary | ICD-10-CM | POA: Diagnosis not present

## 2015-06-14 DIAGNOSIS — S0001XA Abrasion of scalp, initial encounter: Secondary | ICD-10-CM | POA: Insufficient documentation

## 2015-06-14 DIAGNOSIS — Z8719 Personal history of other diseases of the digestive system: Secondary | ICD-10-CM | POA: Insufficient documentation

## 2015-06-14 DIAGNOSIS — Y9389 Activity, other specified: Secondary | ICD-10-CM | POA: Diagnosis not present

## 2015-06-14 DIAGNOSIS — Y92129 Unspecified place in nursing home as the place of occurrence of the external cause: Secondary | ICD-10-CM | POA: Insufficient documentation

## 2015-06-14 DIAGNOSIS — E785 Hyperlipidemia, unspecified: Secondary | ICD-10-CM | POA: Insufficient documentation

## 2015-06-14 DIAGNOSIS — Z87898 Personal history of other specified conditions: Secondary | ICD-10-CM | POA: Diagnosis not present

## 2015-06-14 DIAGNOSIS — Z79899 Other long term (current) drug therapy: Secondary | ICD-10-CM | POA: Insufficient documentation

## 2015-06-14 DIAGNOSIS — W010XXA Fall on same level from slipping, tripping and stumbling without subsequent striking against object, initial encounter: Secondary | ICD-10-CM | POA: Diagnosis not present

## 2015-06-14 DIAGNOSIS — W19XXXA Unspecified fall, initial encounter: Secondary | ICD-10-CM
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
## 2015-06-14 NOTE — ED Notes (Signed)
Bed: WA15 Expected date:  Expected time:  Means of arrival:  Comments: EMS-fall 

## 2015-06-14 NOTE — ED Notes (Signed)
Bed: WHALE Expected date:  Expected time:  Means of arrival:  Comments: 

## 2015-06-14 NOTE — Discharge Instructions (Signed)
As discussed, your evaluation today has been largely reassuring.  But, it is important that you monitor your condition carefully, and do not hesitate to return to the ED if you develop new, or concerning changes in your condition. ? ?Otherwise, please follow-up with your physician for appropriate ongoing care. ? ?

## 2015-06-14 NOTE — ED Notes (Signed)
Patient returned from CT

## 2015-06-14 NOTE — ED Notes (Signed)
Made [facility] aware of transport back to [facility].

## 2015-06-24 ENCOUNTER — Emergency Department (HOSPITAL_COMMUNITY)
Admission: EM | Admit: 2015-06-24 | Discharge: 2015-06-24 | Disposition: A | Discharge status: Home / Self Care | Payer: Medicare Other | Attending: Emergency Medicine | Admitting: Emergency Medicine

## 2015-06-24 ENCOUNTER — Encounter (HOSPITAL_COMMUNITY): Payer: Self-pay

## 2015-06-24 DIAGNOSIS — F0390 Unspecified dementia without behavioral disturbance: Secondary | ICD-10-CM | POA: Insufficient documentation

## 2015-06-24 DIAGNOSIS — S0993XA Unspecified injury of face, initial encounter: Secondary | ICD-10-CM | POA: Diagnosis present

## 2015-06-24 DIAGNOSIS — S50311A Abrasion of right elbow, initial encounter: Secondary | ICD-10-CM | POA: Diagnosis not present

## 2015-06-24 DIAGNOSIS — Z8711 Personal history of peptic ulcer disease: Secondary | ICD-10-CM | POA: Diagnosis not present

## 2015-06-24 DIAGNOSIS — I10 Essential (primary) hypertension: Secondary | ICD-10-CM | POA: Insufficient documentation

## 2015-06-24 DIAGNOSIS — Z79899 Other long term (current) drug therapy: Secondary | ICD-10-CM | POA: Diagnosis not present

## 2015-06-24 DIAGNOSIS — Z87438 Personal history of other diseases of male genital organs: Secondary | ICD-10-CM | POA: Diagnosis not present

## 2015-06-24 DIAGNOSIS — E785 Hyperlipidemia, unspecified: Secondary | ICD-10-CM | POA: Diagnosis not present

## 2015-06-24 DIAGNOSIS — S0083XA Contusion of other part of head, initial encounter: Secondary | ICD-10-CM | POA: Diagnosis not present

## 2015-06-24 DIAGNOSIS — Y92129 Unspecified place in nursing home as the place of occurrence of the external cause: Secondary | ICD-10-CM | POA: Insufficient documentation

## 2015-06-24 DIAGNOSIS — Y998 Other external cause status: Secondary | ICD-10-CM | POA: Insufficient documentation

## 2015-06-24 DIAGNOSIS — Y9389 Activity, other specified: Secondary | ICD-10-CM | POA: Insufficient documentation

## 2015-06-24 DIAGNOSIS — Z8719 Personal history of other diseases of the digestive system: Secondary | ICD-10-CM | POA: Insufficient documentation

## 2015-06-24 DIAGNOSIS — M1990 Unspecified osteoarthritis, unspecified site: Secondary | ICD-10-CM | POA: Diagnosis not present

## 2015-06-24 NOTE — ED Notes (Signed)
PTAR called  

## 2015-06-24 NOTE — ED Notes (Signed)
Bed: WA17 Expected date:  Expected time:  Means of arrival:  Comments: EMS  Fall 
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
## 2015-07-16 ENCOUNTER — Emergency Department (HOSPITAL_COMMUNITY)
Admission: EM | Admit: 2015-07-16 | Discharge: 2015-07-18 | Disposition: A | Discharge status: Home / Self Care | Payer: Medicare Other | Attending: Emergency Medicine | Admitting: Emergency Medicine

## 2015-07-16 ENCOUNTER — Encounter (HOSPITAL_COMMUNITY): Payer: Self-pay | Admitting: Emergency Medicine

## 2015-07-16 DIAGNOSIS — F0391 Unspecified dementia with behavioral disturbance: Secondary | ICD-10-CM | POA: Diagnosis not present

## 2015-07-16 DIAGNOSIS — Z87438 Personal history of other diseases of male genital organs: Secondary | ICD-10-CM | POA: Insufficient documentation

## 2015-07-16 DIAGNOSIS — I340 Nonrheumatic mitral (valve) insufficiency: Secondary | ICD-10-CM | POA: Insufficient documentation

## 2015-07-16 DIAGNOSIS — Z8711 Personal history of peptic ulcer disease: Secondary | ICD-10-CM | POA: Diagnosis not present

## 2015-07-16 DIAGNOSIS — I10 Essential (primary) hypertension: Secondary | ICD-10-CM | POA: Diagnosis not present

## 2015-07-16 DIAGNOSIS — E785 Hyperlipidemia, unspecified: Secondary | ICD-10-CM | POA: Insufficient documentation

## 2015-07-16 DIAGNOSIS — M1990 Unspecified osteoarthritis, unspecified site: Secondary | ICD-10-CM | POA: Insufficient documentation

## 2015-07-16 DIAGNOSIS — Z79899 Other long term (current) drug therapy: Secondary | ICD-10-CM | POA: Diagnosis not present

## 2015-07-16 DIAGNOSIS — Z8719 Personal history of other diseases of the digestive system: Secondary | ICD-10-CM | POA: Diagnosis not present

## 2015-07-16 DIAGNOSIS — F0390 Unspecified dementia without behavioral disturbance: Secondary | ICD-10-CM | POA: Diagnosis present

## 2015-07-16 LAB — CBC WITH DIFFERENTIAL/PLATELET
BASOS ABS: 0 10*3/uL (ref 0.0–0.1)
BASOS PCT: 0 %
EOS ABS: 0.1 10*3/uL (ref 0.0–0.7)
Eosinophils Relative: 2 %
HEMATOCRIT: 38.3 % — AB (ref 39.0–52.0)
Hemoglobin: 12.3 g/dL — ABNORMAL LOW (ref 13.0–17.0)
Lymphocytes Relative: 31 %
Lymphs Abs: 1.3 10*3/uL (ref 0.7–4.0)
MCH: 30.7 pg (ref 26.0–34.0)
MCHC: 32.1 g/dL (ref 30.0–36.0)
MCV: 95.5 fL (ref 78.0–100.0)
Monocytes Absolute: 0.2 10*3/uL (ref 0.1–1.0)
Monocytes Relative: 5 %
NEUTROS PCT: 62 %
Neutro Abs: 2.5 10*3/uL (ref 1.7–7.7)
Platelets: 147 10*3/uL — ABNORMAL LOW (ref 150–400)
RBC: 4.01 MIL/uL — ABNORMAL LOW (ref 4.22–5.81)
RDW: 12.7 % (ref 11.5–15.5)
WBC: 4 10*3/uL (ref 4.0–10.5)

## 2015-07-16 LAB — COMPREHENSIVE METABOLIC PANEL
ALBUMIN: 3.7 g/dL (ref 3.5–5.0)
ALT: 9 U/L — ABNORMAL LOW (ref 17–63)
ANION GAP: 9 (ref 5–15)
AST: 15 U/L (ref 15–41)
Alkaline Phosphatase: 68 U/L (ref 38–126)
BILIRUBIN TOTAL: 0.5 mg/dL (ref 0.3–1.2)
BUN: 25 mg/dL — AB (ref 6–20)
CO2: 27 mmol/L (ref 22–32)
Calcium: 8.8 mg/dL — ABNORMAL LOW (ref 8.9–10.3)
Chloride: 105 mmol/L (ref 101–111)
Creatinine, Ser: 1.49 mg/dL — ABNORMAL HIGH (ref 0.61–1.24)
GFR calc Af Amer: 47 mL/min — ABNORMAL LOW (ref >60)
GFR calc non Af Amer: 41 mL/min — ABNORMAL LOW (ref >60)
GLUCOSE: 95 mg/dL (ref 65–99)
Potassium: 3.7 mmol/L (ref 3.5–5.1)
SODIUM: 141 mmol/L (ref 135–145)
TOTAL PROTEIN: 6.1 g/dL — AB (ref 6.5–8.1)

## 2015-07-16 NOTE — ED Notes (Addendum)
Attempted in and out with no success. Rn Darene Lamer was made aware.

## 2015-07-16 NOTE — ED Notes (Signed)
Pt is from [facility].  Staff state that Pt is becoming combative and they suspect UTI.  They were unable to get a urine sample from Pt.  BP 124/76 P80 RR 16
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
## 2015-07-16 NOTE — ED Notes (Signed)
Bed: WA06 Expected date:  Expected time:  Means of arrival:  Comments: EMS 81 yo hostile behavior to staff/possible UTI

## 2015-07-17 DIAGNOSIS — F0391 Unspecified dementia with behavioral disturbance: Secondary | ICD-10-CM | POA: Diagnosis not present

## 2015-07-17 LAB — URINALYSIS, ROUTINE W REFLEX MICROSCOPIC
Bilirubin Urine: NEGATIVE
GLUCOSE, UA: NEGATIVE mg/dL
Hgb urine dipstick: NEGATIVE
KETONES UR: NEGATIVE mg/dL
Leukocytes, UA: NEGATIVE
Nitrite: NEGATIVE
Protein, ur: NEGATIVE mg/dL
Specific Gravity, Urine: 1.019 (ref 1.005–1.030)
pH: 5.5 (ref 5.0–8.0)

## 2015-07-17 MED ORDER — VITAMIN D3 25 MCG (1000 UNIT) PO TABS
1000.0000 [IU] | ORAL_TABLET | Freq: Every day | ORAL | Status: DC
  Administered 2015-07-17 – 2015-07-18 (×2): 1000 [IU] via ORAL
  Filled 2015-07-17 (×2): qty 1

## 2015-07-17 MED ORDER — ALPRAZOLAM 0.25 MG PO TABS
0.2500 mg | ORAL_TABLET | Freq: Two times a day (BID) | ORAL | Status: DC | PRN
  Administered 2015-07-18: 0.25 mg via ORAL
  Filled 2015-07-17: qty 1

## 2015-07-17 MED ORDER — ACETAMINOPHEN 500 MG PO TABS
500.0000 mg | ORAL_TABLET | Freq: Three times a day (TID) | ORAL | Status: DC
  Administered 2015-07-17 – 2015-07-18 (×3): 500 mg via ORAL
  Filled 2015-07-17 (×3): qty 1

## 2015-07-17 MED ORDER — LISINOPRIL 20 MG PO TABS
20.0000 mg | ORAL_TABLET | Freq: Every day | ORAL | Status: DC
  Administered 2015-07-17 – 2015-07-18 (×2): 20 mg via ORAL
  Filled 2015-07-17 (×2): qty 1

## 2015-07-17 MED ORDER — QUETIAPINE FUMARATE 50 MG PO TABS
50.0000 mg | ORAL_TABLET | Freq: Every day | ORAL | Status: DC
  Administered 2015-07-17 – 2015-07-18 (×2): 50 mg via ORAL
  Filled 2015-07-17 (×2): qty 1

## 2015-07-17 MED ORDER — ATORVASTATIN CALCIUM 20 MG PO TABS
20.0000 mg | ORAL_TABLET | Freq: Every day | ORAL | Status: DC
  Administered 2015-07-17 – 2015-07-18 (×2): 20 mg via ORAL
  Filled 2015-07-17 (×2): qty 1

## 2015-07-17 MED ORDER — AMLODIPINE BESYLATE 5 MG PO TABS
5.0000 mg | ORAL_TABLET | Freq: Every day | ORAL | Status: DC
  Administered 2015-07-17 – 2015-07-18 (×2): 5 mg via ORAL
  Filled 2015-07-17 (×2): qty 1

## 2015-07-17 MED ORDER — GUAIFENESIN 100 MG/5ML PO SOLN
200.0000 mg | Freq: Four times a day (QID) | ORAL | Status: DC | PRN
  Filled 2015-07-17: qty 10

## 2015-07-17 MED ORDER — METOPROLOL TARTRATE 25 MG PO TABS
25.0000 mg | ORAL_TABLET | Freq: Two times a day (BID) | ORAL | Status: DC
  Administered 2015-07-17 – 2015-07-18 (×3): 25 mg via ORAL
  Filled 2015-07-17 (×3): qty 1

## 2015-07-17 MED ORDER — CITALOPRAM HYDROBROMIDE 20 MG PO TABS
20.0000 mg | ORAL_TABLET | Freq: Every day | ORAL | Status: DC
  Administered 2015-07-17 – 2015-07-18 (×2): 20 mg via ORAL
  Filled 2015-07-17 (×2): qty 1

## 2015-07-17 MED ORDER — FUROSEMIDE 40 MG PO TABS
40.0000 mg | ORAL_TABLET | Freq: Every day | ORAL | Status: DC
  Administered 2015-07-17 – 2015-07-18 (×2): 40 mg via ORAL
  Filled 2015-07-17 (×2): qty 1
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
## 2015-07-17 NOTE — Progress Notes (Signed)
CSW spoke with [name] Nabozny/ Daughter of patient. She states that she is certain that she does not want the patient to return to his facility [facility]. Daughter states that she and family plan to take patient home tomorrow upon discharge.  CSW observed patient sleeping at bedside.  CSW made NP aware of conversation with daughter.  [name]/ Daughter / [phone]  Willette Brace [id number] ED CSW 07/17/2015 7:09 PM

## 2015-07-17 NOTE — ED Notes (Signed)
Contact is  [place]/Daughter, [place]/Wife, [phone]

## 2015-07-17 NOTE — ED Provider Notes (Signed)
  Physical Exam  BP 148/67 mmHg  Pulse 56  Temp(Src) 98 F (36.7 C) (Oral)  Resp 18  SpO2 98%  Physical Exam  ED Course  Procedures  MDM Patient here from nursing home. Patient was agitated there. Has been calm in the ED. Psych cleared patient but nursing home won't take him back. Family contacted by case management and wants to take him home tomorrow. Case management request that I put in order for home health.      Wandra Arthurs, MD 07/17/15 [phone]

## 2015-07-17 NOTE — Progress Notes (Signed)
8:15a CSW went to speak with patient. Patient was out of the room at the time. CSW staffed case with nurse. CSW viewed letter of eviction for patient dated 12/27.   [name] [id number] ED CSW 07/17/2015 9:01 AM

## 2015-07-17 NOTE — ED Notes (Signed)
Bed: [id number] Expected date:  Expected time:  Means of arrival:  Comments: RM 6

## 2015-07-17 NOTE — Progress Notes (Addendum)
Pt was transported from the main ED at 9am. He was fed breakfast and then was assisted in the shower for am care. Pt did not remember how to brush his teeth so nurse brushed his teeth.  Pt is very pleasant but is a two person assist due to an unsteady gait., 10am -- Presently he is in a cardiac chair and watching TV. 10:40a- Spoke with social work to find out the disposition of the pt going back to [facility] . Per social work ,she is phoning them back now. EDP made aware. Pt has no skin breakdown and has been very cooperative. He is incontinent of urine and wears a diaper. 10:50a- Per social work the facility is refusing to take the pt back and stated they want him evaluated at [facility]. Social work will speak with the Music therapist. 11:10am _per Education officer, museum the facility filled out an immediate discharge due to pts aggressive behavior. Pt has remained calm while in room 31.  12noon- Pt was incontinent of stool. Diaper changed. Pt was very cooperative . Remains OOB in the cardiac chair. Pt mumbles and has good eye contact but is not oriented at all. 12:30p Awaiting a TTS consult. Pt ate 100% of his lunch with much prompting and pt was fed. (1pm) Charge nurse made aware pt will remain overnight for observation. 1:30p-Pts daughter and wife came to visit. 3pm Pt s/p bowel movement and was cleaned and a new diaper placed. Pt was put back in bed with siderails up. Continue to push po fluids. 4:50p-Pt remains asleep with regular respirations. 5:50pm _Pt asleep but easily awakened. His diaper is dry. Pt fell back to sleep. 6pm Pt OOB in  The cardiac chair. Pt was fed dinner,he constantly had to be reminded to chew his food and swallow. Pt was given a piece of angle food cake to hold and he did not know what to do with it. Diaper changed and pt had a small amount of a bowel movement and diaper was wet with urine. Intake 480 cc at dinner. 7:20p-Report to [facility].

## 2015-07-17 NOTE — Care Management Note (Addendum)
Case Management Note  Patient Details  Name: Chad Delacruz MRN: [id number] Date of Birth: [birth year]  Subjective/Objective: Patient presented to ED from nursing facility with increased confusion and aggression. Patient not permitted to return to facility due to "aggressive behaviors."  Patient has been pleasant and cooperative in the ED.  Patient's family willing to take patient home if discharged tomorrow 12/29                  Action/Plan:  [facility] called and discussed patient with patient's daughter Chad Delacruz.  Chad Delacruz reports she has already ordered private duty nursing services to help patient in the home from 7am to 7pm starting tomorrow at 10am from [facility].  EDCM explained home health services with Chad Delacruz.  Chad Delacruz is agreeable to services and has chosen [facility] for home health services.  Chad Delacruz reports patient needs a wheelchair and 3in1.  Chad Delacruz reports patient had a walker but it broke.  EDCM informed Chad Delacruz that Medicare will pay for one or the other in regards to wheelchair and walker.  Chad Delacruz would like patient to have a wheelchair.  EDCM informed Chad Delacruz that she may purchase a walker at a good will for discount cost.  Orders for dme placed and faxed to [facility] with confirmation of receipt.  Chad Delacruz reports the physician that was seeing patient at nursing facility is the last doctor who has seen patient.  Chad Delacruz reports she will call nursing facility in am to see if facility doctor will continue to see patient out patient. EDCM explained importance of having a pcp and home health services.  Chad Delacruz is agreeable to have contact information for [facility].  EDCM placed contact information for [facility], [facility] and [facility] at patient's bedside for Chad Delacruz to pick up tomorrow.  Chad Delacruz reports it may be difficult to transport patient to doctor's office, [facility] suggested [facility].  Patient lives at home with his wife who has just had knee surgery but is doing very well.  Chad Delacruz reports she will stay with patient's wife overnight for  extra assistance.  Chad Delacruz also reports other family members will be assisting with care.  EDCM informed Chad Delacruz that patient now needs verbal cues to chew and swallow.  Chad Delacruz reports this has gotten worse for patient.  Chad Delacruz is aware that patient will require 24 hour skilled/supervision care and feels she is prepared to take care of patient for a short time at home.  Chad Delacruz reports family will continue to search for placement for patient.  EDCM explained SW with home health services can assist with this.  Discussed patient with EDP.  Home health orders placed and faxed to [facility] with confirmation of receipt.  Patient's daughter very thankful for services.  No further EDCM needs at this time.   Expected Discharge Date:   Unknown           Expected Discharge Plan:     In-House Referral:  Clinical Social Work  Discharge planning Services  CM Consult  Post Acute Care Choice:  Durable Medical Equipment Choice offered to:  Adult Children  DME Arranged:  Programmer, multimedia, 3-N-1 DME Agency:  [place]:  RN, PT, OT, Nurse's Aide, Social Work, Fish farm manager Agency:  [facility]  Status of Service:  Completed, signed off  Medicare Important Message Given:    Date Medicare IM Given:    Medicare IM give by:    Date Additional Medicare IM Given:  Additional Medicare Important Message give by:     If discussed at [place] of Stay Meetings, dates discussed:    Additional CommentsLivia Snellen, RN 07/17/2015, 9:54 PM

## 2015-07-17 NOTE — Progress Notes (Signed)
PLEASE MAKE SURE PATIENT'S DAUGHTER [name] GETS CONTACT INFORMATION FOR [facility], [facility], AND [facility] WHICH HAS BEEN PLACED INPATIENT'S BLACK BINDER CHART # 31 AT [place].  [place]!!

## 2015-07-17 NOTE — Progress Notes (Signed)
CSW staffed with psyc team. Patient is not considered psyc. [place] staffed with CSW Asst. Director. CSW will call family and then [facility] for patient to return to their facility.  [name] [id number] ED CSW 07/17/2015 10:20 AM

## 2015-07-17 NOTE — ED Notes (Signed)
TTS consult has been placed
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
## 2015-07-17 NOTE — BH Assessment (Addendum)
Patient unable to be assessed due to dementia diagnosis. Patient unable to identify name and date of birth. Patient mumbles when asked questions. Patients family present states that the patient has lived in [facility] for almost four years and has a Haematologist. Patients daughter states that patient was sent to the ED for being agitated and received information stating that the patient cannot return. Patients caregiver states that the patient was swinging his arms in the air but not at any specific person and did not hit anyone. Patients daughter states that the patient has declined over the past year.  She states that she would like for the patient to be placed in another facility due to being unhappy with care. Spoke with EDP who states that patient needs CSW placement not TTS. Went back to speak with patients family who states that Karna Christmas came in to tell the family that they can remain in the ED over night and will see a CSW in the morning.   Dr. Billy Fischer states that she will remove TTS consult.   Rosalin Hawking, LCSW Therapeutic Triage Specialist [place] 07/17/2015 4:36 AM
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
## 2015-07-17 NOTE — ED Notes (Signed)
Pt in hall bed, near nursing station. No complaints. NAD

## 2015-07-18 DIAGNOSIS — F0391 Unspecified dementia with behavioral disturbance: Secondary | ICD-10-CM

## 2015-07-18 MED ORDER — QUETIAPINE FUMARATE 50 MG PO TABS: 50.0000 mg | ORAL_TABLET | Freq: Every day | ORAL | Status: DC

## 2015-07-18 MED ORDER — CITALOPRAM HYDROBROMIDE 20 MG PO TABS: 20.0000 mg | ORAL_TABLET | Freq: Every day | ORAL | Status: DC

## 2015-07-18 MED ORDER — ALPRAZOLAM 0.25 MG PO TABS: 0.2500 mg | ORAL_TABLET | Freq: Two times a day (BID) | ORAL | Status: DC | PRN

## 2015-07-18 NOTE — Progress Notes (Signed)
Call from [facility] requesting Narrative of DME order and signed orders from [place] faxed to her a [phone]

## 2015-07-18 NOTE — Progress Notes (Signed)
CSW staffed case with nurse this morning. Nurse stated patient became agitated around 400/500a this morning. Nurse stated patient showed no signs of aggression.   CSW contacted [facility] to arrange home health services. CSW spoke with [facility] intake regarding the referral. CSW informed [facility] that patient would need RN, PT, OT, Aide Speech, and SW. Lanora Manis stated speech was on hold for their agency at this time. CSW informed [facility] Manis that patient was from [facility], as she stated many areas do not provide same day skilled nursing. [facility] stated he would be eligible for services with his insurance. [facility] noted all information she would need for patient to have services. CSW faxed all information (fax# [phone]).   CSW asked if someone would be able to make a visit at the home today for skilled nursing. [facility] call CSW back to state they would be able to provide same day services. Per Lanora Manis, CSW can let the family know that someone will call her today regarding same day services.   CSW called patient's daughter, Dshawn Mcelyea [phone] to inform her that patient is ready for discharge. Patient's daughter stated she would arrive between 11:30 and 12:00p. CSW informed patient's daughter that home health services had been arranged and she would receive a call today from their agency.   No further questions noted at this time for CSW.  [name] [id number] ED CSW 07/18/2015 11:29 AM

## 2015-07-18 NOTE — [facility] Suicide Risk Assessment (Cosign Needed)
Suicide Risk Assessment  Discharge Assessment   [facility] Discharge Suicide Risk Assessment   Demographic Factors:  Male and Age 81 or older  Total Time spent with patient: 20 minutes    Psychiatric Specialty Exam:     Blood pressure 159/74, pulse 62, temperature 98 F (36.7 C), temperature source Oral, resp. rate 18, SpO2 100 %.There is no weight on file to calculate BMI.  General Appearance: Casual and Fairly Groomed  Engineer, water::  None  Speech:  Slurred and mumbles words  Volume:  Decreased  Mood:  unable to obtain secondary to Dementia  Affect:  unable to obtain  Thought Process:  Disorganized and speech is disorganized, unable to articulate  or answer questions.  Orientation:  Other:  unable to obtain, not oriented to salf, time and place.  Thought Content:  unable to obtain  Suicidal Thoughts:  unable to obtain  Homicidal Thoughts:  unable to obtain  Memory:  unable to obtain  Judgement:  Other:  unable to obtain  Insight:  Lacking  Psychomotor Activity:  Normal and calm  Concentration:  Fair  Recall:  unable to obtain, patient is not engagaed secondary to Dementia  Fund of Knowledge:unable to obtain  Language: mumbles words  Akathisia:  No  Handed:  Right  AIMS (if indicated):     Assets:  Others:  unable to obtain, familyb is taking him home from the hospital.  ADL's:  Intact  Cognition: Impaired,  Severe        Has this patient used any form of tobacco in the last 30 days? (Cigarettes, Smokeless Tobacco, Cigars, and/or Pipes) N/A  Mental Status Per Nursing Assessment::   On Admission:     Current Mental Status by Physician: NA  Loss Factors: NA  Historical Factors: NA  Risk Reduction Factors:   Living with another person, especially a relative  Continued Clinical Symptoms:  Depression:   Insomnia  Cognitive Features That Contribute To Risk:  None    Suicide Risk:  Minimal: No identifiable suicidal ideation.  Patients presenting with no risk  factors but with morbid ruminations; may be classified as minimal risk based on the severity of the depressive symptoms  Principal Problem: Dementia Discharge Diagnoses:  Patient Active Problem List   Diagnosis Date Noted  . Dementia [F03.90] 08/02/2011    Priority: High  . Edema, peripheral [R60.9] 10/12/2012  . Bilateral inguinal hernia (BIH), L>R [K40.20] 02/17/2012  . Abdominal tenderness, periumbilic [id number]  . Routine general medical examination at a health care facility [Z00.00] 01/31/2011  . MITRAL REGURGITATION [I08.0] 01/29/2009  . OSTEOARTHRITIS [M19.90] 01/29/2009  . ESOPHAGEAL STRICTURE [K22.2] 09/16/2007  . HYPERLIPIDEMIA [E78.5] 06/21/2007  . Unspecified essential hypertension [I10] 06/21/2007  . MITRAL VALVE PROLAPSE [I05.9] 06/21/2007  . BENIGN PROSTATIC HYPERTROPHY, HX OF [Z87.898] 06/21/2007  . PUD [K27.9] 04/12/2007  . HIATAL HERNIA [K44.9] 04/12/2007    Follow-up Information    Follow up with [facility] DEPT.   Specialty:  Emergency Medicine   Why:  If symptoms worsen   Contact information:   [place] [id number] [place] [phone]      [facility] Call on 07/18/2015.   Why:  As needed This is the home health agency to provide services for you They will call you or a family member soon to arrange your visits but you may call them as needed    Contact information:   [address]  [address],  [place], [place] [address] [phone]      Call [facility].   Why:  As needed This is the company to provide a 3N1 (bedside commode, etc) and wheelchair You may call as needed    Contact information:   [address] [place] [address] [phone]       Plan Of Care/Follow-up recommendations:  Activity:  as tolerated Diet:  regular  Is patient on multiple antipsychotic therapies at discharge:  No   Has Patient had  three or more failed trials of antipsychotic monotherapy by history:  No  Recommended Plan for Multiple Antipsychotic Therapies: NA    Delfin Gant    PMHNP-BC 07/18/2015, 12:47 PM

## 2015-07-18 NOTE — Progress Notes (Signed)
Patient suffers from weakness, balance issues, two plus assist to stand, dementia which impairs their ability to perform daily activities like ADL's, ambulating in the home. A walker will not resolve  issue with performing activities of daily living. A wheelchair will allow patient to safely perform daily activities. Patient is not able to propel themselves in the home using a standard weight wheelchair due to weakness. Patient can self propel in the lightweight wheelchair.  Accessories: elevating leg rests (ELRs), wheel locks, extensions and anti-tippers.

## 2015-07-18 NOTE — Discharge Instructions (Signed)
Dementia °Dementia is a word that is used to describe problems with the brain and how it works. People with dementia have memory loss. They may also have problems with thinking, speaking, or solving problems. It can affect how they act around people, how they do their job, their mood, and their personality. These changes may not show up for a long time. Family or friends may not notice problems in the early part of this disease. °HOME CARE °The following tips are for the person living with, or caring for, the person with dementia. °Make the home safe. °· Remove locks on bathroom doors. °· Use childproof locks on cabinets where alcohol, cleaning supplies, or chemicals are stored. °· Put outlet covers in electrical outlets. °· Put in childproof locks to keep doors and windows safe. °· Remove stove knobs, or put in safety knobs that shut off on their own. °· Lower the temperature on water heaters. °· Label medicines. Lock them in a safe place. °· Keep knives, lighters, matches, power tools, and guns out of reach or in a safe place. °· Remove objects that might break or can hurt the person. °· Make sure lighting is good inside and outside. °· Put in grab bars if needed. °· Use a device that detects falls or other needs for help. °Lessen confusion. °· Keep familiar objects and people around. °· Use night lights or low lit (dim) lights at night. °· Label objects or areas. °· Use reminders, notes, or directions for daily activities or tasks. °· Keep a simple routine that is the same for waking, meals, bathing, dressing, and bedtime. °· Create a calm and quiet home. °· Put up clocks and calendars. °· Keep emergency numbers and the home address near all phones. °· Help show the different times of day. Open the curtains during the day to let light in. °Speak clearly and directly. °· Choose simple words and short sentences. °· Use a gentle, calm voice. °· Do not interrupt. °· If the person has a hard time finding a word to  use, give them the word or thought. °· Ask 1 question at a time. Give enough time for the person to answer. Repeat the question if the person does not answer. °Do things that lessen restlessness. °· Provide a comfortable bed. °· Have the same bedtime routine every night. °· Have a regular walking and activity schedule. °· Lessen naps during the day. °· Do not let the person drink a lot of caffeine. °· Go to events that are not overwhelming. °Eat well and drink fluids. °· Lessen distractions during meal times and snacks. °· Avoid foods that are too hot or too cold. °· Watch how the person chews and swallows. This is to make sure they do not choke. °Other °· Keep all vision, hearing, dental, and medical visits with the doctor. °· Only give medicines as told by the doctor. °· Watch the person's driving ability. Do not let the person drive if he or she cannot drive safely. °· Use a program that helps find a person if they become missing. You may need to register with this program. °GET HELP RIGHT AWAY IF:  °· A fever of 102° F (38.9° C) develops. °· Confusion develops or gets worse. °· Sleepiness develops or gets worse. °· Staying awake is hard to do. °· New behavior problems start like mood swings, aggression, and seeing things that are not there. °· Problems with balance, speech, or falling develop. °· Problems swallowing develop. °· Any   develops.  · Confusion develops or gets worse.  · Sleepiness develops or gets worse.  · Staying awake is hard to do.  · New behavior problems start like mood swings, aggression, and seeing things that are not there.  · Problems with balance, speech, or falling develop.  · Problems swallowing develop.  · Any problems of another sickness develop.  MAKE SURE YOU:  · Understand these instructions.  · Will watch his or her condition.  · Will get help right away if he or she is not doing well or gets worse.     This information is not intended to replace advice given to you by your health care provider. Make sure you discuss any questions you have with your health care provider.     Document Released: 06/18/2008 Document Revised: 09/28/2011 Document Reviewed: 12/01/2010  Elsevier Interactive Patient Education ©2016 Elsevier Inc.

## 2015-07-18 NOTE — Progress Notes (Addendum)
Report from [place] Pt is in the bed and appears comfortable and calm today. Pt is waiting for family to pick him up. A resource list was printed off to give family. Phoned NP to discharge pt . (11:50am)12:30p Family toomk pt home. He was a two person assist to get int he wheelchair. Pt was placed in  His street clothes./

## 2015-07-18 NOTE — Progress Notes (Addendum)
Fax confirmation received for signed DME order and DME narrative sent to Levada Dy of [facility] in house [facility] staff Pura Spice about orders  Entered in D/c instructions and had TCU Rn to reprint for family   [facility] Call on 07/18/2015 As needed This is the home health agency to provide services for you They will call you or a family member soon to arrange your visits but you may call them as needed [address]  [place], [place] [address] [phone]   [facility] Call As needed This is the company to provide a 3N1 (bedside commode, etc) and wheelchair You may call as needed [place] [phone]

## 2015-07-18 NOTE — Consult Note (Signed)
[facility] Face-to-Face Psychiatry Consult   Reason for Consult:  Anxiety, agitation, Dementia by hx. Referring Physician:  EDP Patient Identification: Chad Delacruz MRN:  [id number] Principal Diagnosis: Dementia Diagnosis:   Patient Active Problem List   Diagnosis Date Noted  . Dementia [F03.90] 08/02/2011    Priority: High  . Edema, peripheral [R60.9] 10/12/2012  . Bilateral inguinal hernia (BIH), L>R [K40.20] 02/17/2012  . Abdominal tenderness, periumbilic [Y81.448] 18/56/3149  . Routine general medical examination at a health care facility [Z00.00] 01/31/2011  . MITRAL REGURGITATION [I08.0] 01/29/2009  . OSTEOARTHRITIS [M19.90] 01/29/2009  . ESOPHAGEAL STRICTURE [K22.2] 09/16/2007  . HYPERLIPIDEMIA [E78.5] 06/21/2007  . Unspecified essential hypertension [I10] 06/21/2007  . MITRAL VALVE PROLAPSE [I05.9] 06/21/2007  . BENIGN PROSTATIC HYPERTROPHY, HX OF [Z87.898] 06/21/2007  . PUD [K27.9] 04/12/2007  . HIATAL HERNIA [K44.9] 04/12/2007    Total Time spent with patient: 45 minutes  Subjective:   Chad Delacruz is a 81 y.o. male patient admitted with Anxiety, agitation, Dementia by hx.  HPI:  AA male, 81 years old was evaluated for acute exacerbation of Dementia.  Patient was brought in from a [place] where he resides.  Patient was brought in for agitation and he has long standing diagnosis of Dementia.  Patient is unable to participate in the evaluation interview.  Please see note by TTS staff for collateral information gathered from his wife and daughter.  Patient has had no behavior issues since his arrival to our unit.  He seems to have sun downing where he gets confused in the evening.  Patient is eating well when fed by his wife and daughter during their visits.  Patient is discharged home to his family and will not be going back to [facility].  He will be getting home health care assistance at home.  Past Psychiatric History:  Dementia, Depression  Risk to  Self: Suicidal Ideation:  (unk; unable to confirm or deny) Suicidal Intent:  (unk) Is patient at risk for suicide?:  (unk) Suicidal Plan?:  (unk) Access to Means:  (unk) What has been your use of drugs/alcohol within the last 12 months?:  (unk) How many times?:  (unk) Other Self Harm Risks:  (unk) Triggers for Past Attempts:  (unk) Intentional Self Injurious Behavior:  (unk) Risk to Others: Homicidal Ideation:  (unk; unable to confirm or deny) Thoughts of Harm to Others: No Current Homicidal Intent:  (unk) Current Homicidal Plan:  (unk) Access to Homicidal Means:  (unk) Identified Victim:  (unk) History of harm to others?:  (unk) Assessment of Violence:  (unk) Violent Behavior Description:  (patient currently calm and cooperative; no behaviors reporte) Does patient have access to weapons?: No Criminal Charges Pending?: No Does patient have a court date: No Prior Inpatient Therapy: Prior Inpatient Therapy:  (unk) Prior Therapy Dates:  (unk) Prior Therapy Facilty/Provider(s):  (unk) Reason for Treatment:  (unk) Prior Outpatient Therapy: Prior Outpatient Therapy:  (unk) Prior Therapy Dates:  (unk) Prior Therapy Facilty/Provider(s):  (unk) Reason for Treatment:  (unk) Does patient have an ACCT team?: Unknown Does patient have Intensive In-House Services?  : Unknown Does patient have [facility] services? : Unknown Does patient have P4[facility] services?: Unknown  Past Medical History:  Past Medical History  Diagnosis Date  . HYPERLIPIDEMIA   . MITRAL REGURGITATION   . Unspecified essential hypertension   . MITRAL VALVE PROLAPSE   . ESOPHAGEAL STRICTURE   . PUD   . HIATAL HERNIA   . OSTEOARTHRITIS   . GASTROINTESTINAL HEMORRHAGE, HX  OF   . BENIGN PROSTATIC HYPERTROPHY, HX OF   . Dementia     Past Surgical History  Procedure Laterality Date  . Hemorrhoid surgery    . Circumcision    . Mitral valve repair  05/07/2009    Dr. Roxy Manns  . Hernia repair  03-31-12    BIH repair   .  Hernia repair  03/2012   Family History:  Family History  Problem Relation Age of Onset  . Hypertension Mother   . Hypertension Father    Family Psychiatric  History:   unknown Social History:  History  Alcohol Use No     History  Drug Use No    Social History   Social History  . Marital Status: Married    Spouse Name: N/A  . Number of Children: N/A  . Years of Education: N/A   Social History Main Topics  . Smoking status: Never Smoker   . Smokeless tobacco: Never Used  . Alcohol Use: No  . Drug Use: No  . Sexual Activity: Not Currently   Other Topics Concern  . None   Social History Narrative   11th grade. Married 1950 - life sentence. 3 dtrs - '49, '50, '52; 2 sons - '51, '75. He had children by three women. 4 grandchildren. 1 great-grand.   He previously worked for [employer] 25 years, retired from that. Worked as a Development worker, community but totally retired in 2011. End of Life care - wishes full resuscitation and intensive treatment to include mechanical ventilation.      Lives at [facility].    Additional Social History:    Pain Medications: SEE MAR Prescriptions: SEE MAR Over the Counter: SEE MAR History of alcohol / drug use?: Yes (unk)  Allergies:   Allergies  Allergen Reactions  . Aspirin Other (See Comments)    REACTION: BLEEDING    Labs:  Results for orders placed or performed during the hospital encounter of 07/16/15 (from the past 48 hour(s))  CBC with Differential     Status: Abnormal   Collection Time: 07/16/15 11:23 PM  Result Value Ref Range   WBC 4.0 4.0 - 10.5 K/uL   RBC 4.01 (L) 4.22 - 5.81 MIL/uL   Hemoglobin 12.3 (L) 13.0 - 17.0 g/dL   HCT 38.3 (L) 39.0 - 52.0 %   MCV 95.5 78.0 - 100.0 fL   MCH 30.7 26.0 - 34.0 pg   MCHC 32.1 30.0 - 36.0 g/dL   RDW 12.7 11.5 - 15.5 %   Platelets 147 (L) 150 - 400 K/uL   Neutrophils Relative % 62 %   Neutro Abs 2.5 1.7 - 7.7 K/uL   Lymphocytes Relative 31 %   Lymphs Abs 1.3 0.7 - 4.0  K/uL   Monocytes Relative 5 %   Monocytes Absolute 0.2 0.1 - 1.0 K/uL   Eosinophils Relative 2 %   Eosinophils Absolute 0.1 0.0 - 0.7 K/uL   Basophils Relative 0 %   Basophils Absolute 0.0 0.0 - 0.1 K/uL  Comprehensive metabolic panel     Status: Abnormal   Collection Time: 07/16/15 11:23 PM  Result Value Ref Range   Sodium 141 135 - 145 mmol/L   Potassium 3.7 3.5 - 5.1 mmol/L   Chloride 105 101 - 111 mmol/L   CO2 27 22 - 32 mmol/L   Glucose, Bld 95 65 - 99 mg/dL   BUN 25 (H) 6 - 20 mg/dL   Creatinine, Ser 1.49 (H) 0.61 - 1.24 mg/dL  Calcium 8.8 (L) 8.9 - 10.3 mg/dL   Total Protein 6.1 (L) 6.5 - 8.1 g/dL   Albumin 3.7 3.5 - 5.0 g/dL   AST 15 15 - 41 U/L   ALT 9 (L) 17 - 63 U/L   Alkaline Phosphatase 68 38 - 126 U/L   Total Bilirubin 0.5 0.3 - 1.2 mg/dL   GFR calc non Af Amer 41 (L) >60 mL/min   GFR calc Af Amer 47 (L) >60 mL/min    Comment: (NOTE) The eGFR has been calculated using the CKD EPI equation. This calculation has not been validated in all clinical situations. eGFR's persistently <60 mL/min signify possible Chronic Kidney Disease.    Anion gap 9 5 - 15  Urinalysis, Routine w reflex microscopic-may I&O cath if menses (not at [facility])     Status: None   Collection Time: 07/17/15 12:34 AM  Result Value Ref Range   Color, Urine YELLOW YELLOW   APPearance CLEAR CLEAR   Specific Gravity, Urine 1.019 1.005 - 1.030   pH 5.5 5.0 - 8.0   Glucose, UA NEGATIVE NEGATIVE mg/dL   Hgb urine dipstick NEGATIVE NEGATIVE   Bilirubin Urine NEGATIVE NEGATIVE   Ketones, ur NEGATIVE NEGATIVE mg/dL   Protein, ur NEGATIVE NEGATIVE mg/dL   Nitrite NEGATIVE NEGATIVE   Leukocytes, UA NEGATIVE NEGATIVE    Comment: MICROSCOPIC NOT DONE ON URINES WITH NEGATIVE PROTEIN, BLOOD, LEUKOCYTES, NITRITE, OR GLUCOSE <1000 mg/dL.    Current Facility-Administered Medications  Medication Dose Route Frequency Provider Last Rate Last Dose  . acetaminophen (TYLENOL) tablet 500 mg  500 mg Oral TID Nat Christen, MD   500 mg at 07/18/15 9379  . ALPRAZolam Duanne Moron) tablet 0.25 mg  0.25 mg Oral BID PRN Nat Christen, MD   0.25 mg at 07/18/15 0240  . amLODipine (NORVASC) tablet 5 mg  5 mg Oral Daily Nat Christen, MD   5 mg at 07/18/15 9735  . atorvastatin (LIPITOR) tablet 20 mg  20 mg Oral Daily Nat Christen, MD   20 mg at 07/18/15 3299  . cholecalciferol (VITAMIN D) tablet 1,000 Units  1,000 Units Oral Daily Nat Christen, MD   1,000 Units at 07/18/15 [phone]  . citalopram (CELEXA) tablet 20 mg  20 mg Oral Daily Nat Christen, MD   20 mg at 07/18/15 8341  . furosemide (LASIX) tablet 40 mg  40 mg Oral Daily Nat Christen, MD   40 mg at 07/18/15 1128  . guaiFENesin (ROBITUSSIN) 100 MG/5ML solution 200 mg  200 mg Oral Q6H PRN Nat Christen, MD      . lisinopril (PRINIVIL,ZESTRIL) tablet 20 mg  20 mg Oral Daily Nat Christen, MD   20 mg at 07/18/15 9622  . metoprolol tartrate (LOPRESSOR) tablet 25 mg  25 mg Oral BID Nat Christen, MD   25 mg at 07/18/15 2979  . QUEtiapine (SEROQUEL) tablet 50 mg  50 mg Oral Daily Nat Christen, MD   50 mg at 07/18/15 8921   Current Outpatient Prescriptions  Medication Sig Dispense Refill  . acetaminophen (PAIN RELIEF) 500 MG tablet Take 500 mg by mouth 3 (three) times daily. For pain    . ALPRAZolam (XANAX) 0.25 MG tablet Take 0.25 mg by mouth 2 (two) times daily as needed for anxiety.    Marland Kitchen amLODipine (NORVASC) 5 MG tablet TAKE 1 TABLET (5 MG TOTAL) BY MOUTH DAILY. 90 tablet 2  . atorvastatin (LIPITOR) 20 MG tablet Take 20 mg by mouth daily.    . cholecalciferol (VITAMIN D)  1000 UNITS tablet Take 1,000 Units by mouth daily.    . citalopram (CELEXA) 20 MG tablet Take 20 mg by mouth daily.    . furosemide (LASIX) 40 MG tablet Take 40 mg by mouth daily.    Marland Kitchen guaifenesin (ROBITUSSIN) 100 MG/5ML syrup Take 200 mg by mouth every 6 (six) hours as needed for cough.     Marland Kitchen lisinopril (PRINIVIL,ZESTRIL) 20 MG tablet Take 20 mg by mouth daily.    . metoprolol tartrate (LOPRESSOR) 25 MG tablet Take 25 mg by mouth  2 (two) times daily.    Marland Kitchen PRESCRIPTION MEDICATION Apply 1 application topically every evening. Apply antibiotic ointment to blister on left big toe and cover with dressing daily until healed    . QUEtiapine (SEROQUEL) 25 MG tablet Take 50 mg by mouth daily.     Marland Kitchen ALPRAZolam (XANAX) 0.25 MG tablet Take 1 tablet (0.25 mg total) by mouth 2 (two) times daily as needed for anxiety. 30 tablet 0  . atorvastatin (LIPITOR) 80 MG tablet TAKE 1 TABLET (80 MG TOTAL) BY MOUTH DAILY. (Patient not taking: Reported on 06/10/2015) 90 tablet 3  . citalopram (CELEXA) 20 MG tablet Take 1 tablet (20 mg total) by mouth daily. 30 tablet 0  . QUEtiapine (SEROQUEL) 50 MG tablet Take 1 tablet (50 mg total) by mouth daily. 30 tablet 0    Musculoskeletal: Strength & Muscle Tone: lying down in bed Gait & Station: lying down in bed Patient leans: lying down in bed  Psychiatric Specialty Exam: Review of Systems  Unable to perform ROS: dementia  see PMH as documented.  Blood pressure 159/74, pulse 62, temperature 98 F (36.7 C), temperature source Oral, resp. rate 18, SpO2 100 %.There is no weight on file to calculate BMI.  General Appearance: Casual and Fairly Groomed  Engineer, water::  None  Speech:  Slurred and mumbles words  Volume:  Decreased  Mood:  unable to obtain secondary to Dementia  Affect:  unable to obtain  Thought Process:  Disorganized and speech is disorganized, unable to articulate  or answer questions.  Orientation:  Other:  unable to obtain, not oriented to salf, time and place.  Thought Content:  unable to obtain  Suicidal Thoughts:  unable to obtain  Homicidal Thoughts:  unable to obtain  Memory:  unable to obtain  Judgement:  Other:  unable to obtain  Insight:  Lacking  Psychomotor Activity:  Normal and calm  Concentration:  Fair  Recall:  unable to obtain, patient is not engagaed secondary to Dementia  Fund of Knowledge:unable to obtain  Language: mumbles words  Akathisia:  No  Handed:   Right  AIMS (if indicated):     Assets:  Others:  unable to obtain, familyb is taking him home from the hospital.  ADL's:  Intact  Cognition: Impaired,  Severe  Sleep:       Disposition:  Discharge home, follow up with your out patient providers.  Delfin Gant    PMHNP-BC 07/18/2015 12:11 PM Patient seen face-to-face for psychiatric evaluation, chart reviewed and case discussed with the physician extender and developed treatment plan. Reviewed the information documented and agree with the treatment plan. Corena Pilgrim, MD

## 2015-07-24 ENCOUNTER — Telehealth: Payer: Self-pay | Admitting: *Deleted

## 2015-07-24 NOTE — Telephone Encounter (Signed)
I regret that I am not able to sign off on any home health until the patient is seen as he has not been seen for this problem and would be medical fraud.

## 2015-07-24 NOTE — Telephone Encounter (Signed)
i called and talked with susan---advised that greg will need to see patient first (get established) before he can approve any home health orders---patient has appt on 1/9, we can address susan's requests after patient is seen

## 2015-07-24 NOTE — Telephone Encounter (Signed)
Pt was in a nursing facility. He had fell and went to hospital. From there he was sent home with home health care. Nursing facility wouldn't take him back.  Manuela Schwartz with [facility] called wondering if we will sign off on PT, Speech Therapy, Skilled Nursing , Home Health Aid and OT He was last seen 08/2013 with [facility] and has an establishing appt with Marya Amsler on 1/9 Manuela Schwartz can be reached at [phone].

## 2015-07-26 ENCOUNTER — Telehealth: Payer: Self-pay | Admitting: *Deleted

## 2015-07-29 ENCOUNTER — Ambulatory Visit (INDEPENDENT_AMBULATORY_CARE_PROVIDER_SITE_OTHER): Payer: Medicare Other | Admitting: Family

## 2015-07-29 ENCOUNTER — Encounter: Payer: Self-pay | Admitting: Family

## 2015-07-29 VITALS — BP 136/84 | HR 68 | Temp 98.0°F | Resp 16 | Ht 69.0 in

## 2015-07-29 DIAGNOSIS — E785 Hyperlipidemia, unspecified: Secondary | ICD-10-CM | POA: Diagnosis not present

## 2015-07-29 DIAGNOSIS — Z742 Need for assistance at home and no other household member able to render care: Secondary | ICD-10-CM | POA: Insufficient documentation

## 2015-07-29 DIAGNOSIS — I10 Essential (primary) hypertension: Secondary | ICD-10-CM | POA: Diagnosis not present

## 2015-07-29 DIAGNOSIS — Z7409 Other reduced mobility: Secondary | ICD-10-CM | POA: Insufficient documentation

## 2015-07-29 DIAGNOSIS — F0391 Unspecified dementia with behavioral disturbance: Secondary | ICD-10-CM | POA: Diagnosis not present

## 2015-07-29 MED ORDER — LISINOPRIL 20 MG PO TABS: 20.0000 mg | ORAL_TABLET | Freq: Every day | ORAL | Status: DC

## 2015-07-29 MED ORDER — METOPROLOL TARTRATE 25 MG PO TABS: 25.0000 mg | ORAL_TABLET | Freq: Two times a day (BID) | ORAL | Status: DC

## 2015-07-29 MED ORDER — ALPRAZOLAM 0.25 MG PO TABS: 0.2500 mg | ORAL_TABLET | Freq: Two times a day (BID) | ORAL | Status: DC | PRN

## 2015-07-29 MED ORDER — FUROSEMIDE 40 MG PO TABS: 40.0000 mg | ORAL_TABLET | Freq: Every day | ORAL | Status: DC

## 2015-07-29 MED ORDER — CITALOPRAM HYDROBROMIDE 20 MG PO TABS: 20.0000 mg | ORAL_TABLET | Freq: Every day | ORAL | Status: DC

## 2015-07-29 MED ORDER — ACETAMINOPHEN 500 MG PO TABS: 500.0000 mg | ORAL_TABLET | Freq: Three times a day (TID) | ORAL | Status: DC

## 2015-07-29 MED ORDER — ATORVASTATIN CALCIUM 20 MG PO TABS: 20.0000 mg | ORAL_TABLET | Freq: Every day | ORAL | Status: DC

## 2015-07-29 MED ORDER — QUETIAPINE FUMARATE 50 MG PO TABS: 50.0000 mg | ORAL_TABLET | Freq: Every day | ORAL | Status: DC

## 2015-07-29 NOTE — Patient Instructions (Addendum)
Thank you for choosing [facility].  Summary/Instructions:  Your prescription(s) have been submitted to your pharmacy or been printed and provided for you. Please take as directed and contact our office if you believe you are having problem(s) with the medication(s) or have any questions.  If your symptoms worsen or fail to improve, please contact our office for further instruction, or in case of emergency go directly to the emergency room at the closest medical facility.   Palliative Care Palliative care is the care of your body, mind, and spirit. Palliative care services are offered to people dealing with serious and life-threatening illnesses, often in the hospital or a long-term care setting. Palliative care requires a team of people who will help to ensure:  Pain and symptoms are controlled.  Family support.  Spiritual support.  Emotional and social support.  Comfort. Palliative care is a way to bring comfort and peace of mind to a person and his or her family. It can also have a positive impact on the course of illness.  WHO CAN RECEIVE PALLIATIVE CARE SERVICES? Palliative care is offered to children and adults when they are seriously ill and may not be responding well to treatment options. The person may be undergoing active treatment, such as chemotherapy, or may have stopped active treatment. Some people may have just been diagnosed with advanced disease or life-limiting illness. A health care provider will usually recommend palliative care services when more support would be helpful. WHAT TYPES OF SERVICES ARE INCLUDED IN PALLIATIVE CARE? Palliative care includes a team of health care providers and supporters who come together to help a person facing serious and life-threatening illness. The person's existing doctors are included in the care team, and supporters are added. Family members and friends may also receive palliative care services to cope with stress and other  concerns. Services are different for each person and are based on the person's needs and preferences. The following people make up a palliative care team:  The person receiving care and his or her family.  Physicians, including primary health care providers and specialists.  Nurses.  A psychosocial worker. Other people on the palliative care team may include:   A pain specialist and sometimes a hospice specialist.  A financial or Research officer, trade union.  [employer], such as school and spiritual organizations.  Religious leaders.  A care coordinator or case manager.  A bereavement coordinator. The team will talk with the person and his or her family about:  The role of the pain specialist and the hospice specialist if this applies.  The person's active physical symptoms, such as pain, nausea, vomiting, and shortness of breath.  Stress, depression, and anxiety symptoms.  Function and mobility issues and how to stay as active as possible.  Treatment options and how they may affect life.  Spiritual wishes, such as rituals and prayer.  Legacy and memory making activities.  Life and death as a normal process.  Advance directives or living wills, health care proxies, and end-of-life care.  Any other concerns or issues. The palliative care team will make it okay to talk about difficult issues and topics. Preferences and sensitive spiritual and emotional concerns are of high importance. PALLIATIVE CARE AND HOSPICE ARE SOMEWHAT DIFFERENT Palliative care and hospice care have similar goals of managing symptoms, promoting comfort, and maintaining dignity. However, hospice care is an option for people during the last 6 months of life expectancy. The palliative care team will coordinate the many support services provided during  any phase of serious illness.    This information is not intended to replace advice given to you by your health care provider. Make sure you discuss  any questions you have with your health care provider.   Document Released: 07/11/2013 Document Reviewed: 07/11/2013 Elsevier Interactive Patient Education 2016 Elsevier Inc.  Dementia With Lewy Bodies Dementia with Lewy bodies is a common type of dementia that gets progressively worse with time. Typical characteristics include progressive worsening of mental function combined with 3 other features: seeing things that are not there (hallucinations), significant fluctuations in alertness and attention, and changes in movement similar to Parkinson's disease (rigid muscles, slowed movement, and tremors). Dementia with Lewy bodies has many similar symptoms that Parkinson's disease and Alzheimer's disease has. CAUSES The symptoms of dementia with Lewy bodies are caused by the buildup of Lewy bodies, which are proteins that build up in brain cells and affect mental function and movement. No one knows exactly what causes the buildup of Lewy bodies, but it may be related to Alzheimer's dementia or Parkinson's disease.  SYMPTOMS   Memory problems.  Confusion.  Reduced attention span.  Hallucinations.  False ideas about another person or situation (delusions).  Trouble moving (rigid muscles, slowed movement, and tremors).  Shuffling movements (gait).  Sleep problems (acting out dreams).  Fluctuating attention (periods of drowsiness or lethargy, long periods of time spent staring into space, or disorganized speech). DIAGNOSIS There is no specific test to diagnose dementia with Lewy bodies, but your caregiver will evaluate you based on your symptoms and physical exam. Your evaluation may also include:  Memory testing.  Lab tests.  Brain imaging (CT scan, MRI).  Electroencephalogram (EEG).  Lumbar puncture. TREATMENT  There is no cure for dementia with Lewy bodies. Medicines may be prescribed to help with mental function and movement.  HOME CARE INSTRUCTIONS The care of individuals  with dementia is varied and dependent upon the progression of the dementia. The following suggestions are intended for the person living with, or caring for, the person with dementia.  Create a safe environment.  Remove the locks on bathroom doors to prevent the person from accidentally locking himself or herself in.  Use childproof latches on kitchen cabinets and any place where cleaning supplies, chemicals, or alcohol are kept.  Use childproof covers in unused electrical outlets.  Install childproof devices to keep doors and windows secured.  Remove stove knobs or install safety knobs and an automatic shut-off on the stove.  Lower the temperature on water heaters.  Label medicines and keep them locked up.  Secure knives, lighters, matches, power tools, and guns, and keep these items out of reach.  Keep the house free from clutter. Remove rugs or anything that might contribute to a fall.  Remove objects that might break and hurt the person.  Make sure lighting is good, both inside and outside.  Install grab rails as needed.  Use a monitoring device to alert you to falls or other needs for help.  Reduce confusion.  Keep familiar objects and people around.  Use night lights or dim lights at night.  Label items or areas.  Use reminders, notes, or directions for daily activities or tasks.  Keep a simple, consistent routine for waking, meals, bathing, dressing, and bedtime.  Create a calm, quiet environment.  Place large clocks and calendars prominently.  Display emergency numbers and the home address near all telephones.  Use cues to establish different times of the day. An example is to  open curtains to let the natural light in during the day.  Use effective communication.  Choose simple words and short sentences.  Use a gentle, calm tone of voice.  Be careful not to interrupt.  If the person is struggling to find a word or communicate a thought, try to provide  the word or thought.  Ask one question at a time. Allow the person ample time to answer questions. Repeat the question again if the person does not respond.  Reduce nighttime restlessness.  Provide a comfortable bed.  Have a consistent nighttime routine.  Ensure a regular walking or physical activity schedule. Involve the person in daily activities as much as possible.  Limit napping during the day.  Limit caffeine.  Attend social events that stimulate rather than overwhelm the senses.  Encourage good nutrition and hydration.  Reduce distractions during meal times and snacks.  Avoid foods that are too hot or too cold.  Monitor chewing and swallowing ability.  Continue with routine vision, hearing, dental, and medical screenings.  Only give over-the-counter or prescription medicines as directed by the caregiver.  Monitor driving abilities. Do not allow the person to drive when safe driving is no longer possible.  Register with an identification program which could provide location assistance in the event of a missing person situation. SEEK MEDICAL CARE IF:  New behavioral problems develop, such as moodiness or aggressiveness.  Any new problem with brain function develop, such as balance, speech, or falling a lot.  Problems with swallowing develop. Small changes or worsening in any aspect of brain function can be a sign that the illness is getting worse. It can also be a sign of another medical illness such as infection. Seeing a caregiver right away is important.  SEEK IMMEDIATE MEDICAL CARE IF:  A fever develops.  Confusion develops or worsens.  New or worsened sleepiness develops or staying awake becomes difficult.   This information is not intended to replace advice given to you by your health care provider. Make sure you discuss any questions you have with your health care provider.   Document Released: 03/28/2002 Document Revised: 09/28/2011 Document Reviewed:  03/02/2011 Elsevier Interactive Patient Education [employer].

## 2015-07-29 NOTE — Progress Notes (Signed)
Subjective:    Patient ID: Chad Delacruz, male    DOB: 15-May-1927, 82 y.o.   MRN: [id number]  Chief Complaint  Patient presents with  . Establish Care    wants refill of all meds, wants to go through meds to be sure that he needs to take all of them or be taken off of some    HPI:  Chad Delacruz is a 82 y.o. male who  has a past medical history of HYPERLIPIDEMIA; MITRAL REGURGITATION; Unspecified essential hypertension; MITRAL VALVE PROLAPSE; ESOPHAGEAL STRICTURE; PUD; OSTEOARTHRITIS; GASTROINTESTINAL HEMORRHAGE, HX OF; BENIGN PROSTATIC HYPERTROPHY, HX OF; Dementia; and HIATAL HERNIA. and presents today for an office visit to establish care. His daughter is present for today's visit and is the primary historian as the patient has a significant history of dementia.  1.) Hypertension  - currently maintained on metoprolol, amlodipine, lisinopril, and furosemide. Reports that he takes the medication as prescribed and denies adverse side effects. Denies symptoms of end organ damage. Does not currently take his blood pressure at home.   BP Readings from Last 3 Encounters:  07/29/15 136/84  07/18/15 159/74  06/24/15 120/61     2.) Dementia - Diagnosed about 5 years ago and his daughter notes that most recently stopped recognizing family and has decreased mobility and abilities to complete his activities of daily living. He has experienced agitation recently which has caused his discharge from his previous home. Reports that he is not able to care for himself at all. Has not been seen by neurology.   3.) Home health - Currently receiving home health nursing and therapies to help with his dementia and mobility, but is need of order to continue with current home health. He is not able to complete his activitieis of daily living without assistance and requires around the clock supervision secondary to his dementia. Sitters are independently hired by family  He has been working with physical therapy  to help with his muscle strength and endurance. He has been most recently discharged from his previous memory care unit secondary to agitation and is currently residing at home with no means of transportation as he cannot leave the house safely without assistance.    Allergies  Allergen Reactions  . Aspirin Other (See Comments)    REACTION: BLEEDING    Current Outpatient Prescriptions on File Prior to Visit  Medication Sig Dispense Refill  . cholecalciferol (VITAMIN D) 1000 UNITS tablet Take 1,000 Units by mouth daily.    Marland Kitchen guaifenesin (ROBITUSSIN) 100 MG/5ML syrup Take 200 mg by mouth every 6 (six) hours as needed for cough.     Marland Kitchen PRESCRIPTION MEDICATION Apply 1 application topically every evening. Apply antibiotic ointment to blister on left big toe and cover with dressing daily until healed     No current facility-administered medications on file prior to visit.    Review of Systems  Constitutional: Negative for fever and chills.  Respiratory: Negative for chest tightness and shortness of breath.   Cardiovascular: Negative for chest pain, palpitations and leg swelling.  Genitourinary: Negative for dysuria, urgency, frequency and flank pain.  Psychiatric/Behavioral: Positive for confusion and agitation.      Objective:    BP 136/84 mmHg  Pulse 68  Temp(Src) 98 F (36.7 C) (Oral)  Resp 16  Ht 5\' 9"  (1.753 m)  Wt   SpO2 95% Nursing note and vital signs reviewed.  MMSE - Mini Mental State Exam 07/29/2015  Orientation to time 0  Orientation to Place 1  Registration 0  Attention/ Calculation 0  Recall 0  Language- name 2 objects 0  Language- repeat 1  Language- follow 3 step command 0  Language- read & follow direction 0  Write a sentence 0  Copy design 0  Total score 2    Physical Exam  Constitutional: He is oriented to person, place, and time. He appears well-developed and well-nourished. No distress.  Cardiovascular: Normal rate, regular rhythm, normal heart sounds  and intact distal pulses.   Pulmonary/Chest: Effort normal and breath sounds normal.  Neurological: He is alert and oriented to person, place, and time.  Skin: Skin is warm and dry.  Psychiatric: He has a normal mood and affect. His speech is normal. Thought content normal. His affect is not inappropriate. Cognition and memory are impaired. He expresses impulsivity and inappropriate judgment. He exhibits abnormal recent memory and abnormal remote memory. He is inattentive.       Assessment & Plan:   Problem List Items Addressed This Visit      Cardiovascular and Mediastinum   Essential hypertension    Hypertension well controlled with current regimen and below goal of 140/90. Based on patient's current declining mental status, question possible discontinuation of mediation as quality of life is likely to decrease. Follow up pending family meeting.       Relevant Medications   metoprolol tartrate (LOPRESSOR) 25 MG tablet   lisinopril (PRINIVIL,ZESTRIL) 20 MG tablet   furosemide (LASIX) 40 MG tablet   atorvastatin (LIPITOR) 20 MG tablet     Nervous and Auditory   Dementia - Primary    MMSE with severe dementia. Based on family reports he has significant decline in the past year with increased memory impairment and increased agitation at times. Currently living at home with family and sitter service provided by family. Agitation attempted control with quetipaine, citalopram and alprazolam. Recommend minimal use of alprazolam as this may increase confusion. Potential referral to neurology for symptom management and further recommended treatment. Family is currently working on seeking potential admission to skilled nursing facility. Continue current dosage of citalopram, quetiapine and alprazolam. Recommended and discussed potential for palliative care for goals of care as dementia is likely to progress at an undetermined rate.       Relevant Medications   citalopram (CELEXA) 20 MG tablet    QUEtiapine (SEROQUEL) 50 MG tablet   ALPRAZolam (XANAX) 0.25 MG tablet   acetaminophen (PAIN RELIEF) 500 MG tablet   Other Relevant Orders   Ambulatory referral to [facility]     Other   Hyperlipidemia    Stable and currently maintained on atorvastatin. No obvious adverse effects. Continue current dosage of atorvastatin with consideration for possible discontinuation.       Relevant Medications   metoprolol tartrate (LOPRESSOR) 25 MG tablet   lisinopril (PRINIVIL,ZESTRIL) 20 MG tablet   furosemide (LASIX) 40 MG tablet   atorvastatin (LIPITOR) 20 MG tablet   Need for home health care    Patient with severe dementia and the inability to perform ADLs or care for himself. Requires safety sitters. He is not able to leave the house safely without supervision. Given declining mental status, recommend SLP to help with communication and PT/OT to help with ADLs and to assist with muscle strength and endurance maintenance at this time. Will also require home health nursing for assistance with medication management and general care.       Relevant Orders   Ambulatory referral to Home Health   Impaired mobility and ADLs  Relevant Orders   Ambulatory referral to [facility]

## 2015-07-29 NOTE — Progress Notes (Signed)
Pre visit review using our clinic review tool, if applicable. No additional management support is needed unless otherwise documented below in the visit note. 

## 2015-07-29 NOTE — Assessment & Plan Note (Signed)
Patient with severe dementia and the inability to perform ADLs or care for himself. Requires safety sitters. He is not able to leave the house safely without supervision. Given declining mental status, recommend SLP to help with communication and PT/OT to help with ADLs and to assist with muscle strength and endurance maintenance at this time. Will also require home health nursing for assistance with medication management and general care.

## 2015-07-29 NOTE — Assessment & Plan Note (Signed)
Hypertension well controlled with current regimen and below goal of 140/90. Based on patient's current declining mental status, question possible discontinuation of mediation as quality of life is likely to decrease. Follow up pending family meeting.

## 2015-07-29 NOTE — Assessment & Plan Note (Signed)
MMSE with severe dementia. Based on family reports he has significant decline in the past year with increased memory impairment and increased agitation at times. Currently living at home with family and sitter service provided by family. Agitation attempted control with quetipaine, citalopram and alprazolam. Recommend minimal use of alprazolam as this may increase confusion. Potential referral to neurology for symptom management and further recommended treatment. Family is currently working on seeking potential admission to skilled nursing facility. Continue current dosage of citalopram, quetiapine and alprazolam. Recommended and discussed potential for palliative care for goals of care as dementia is likely to progress at an undetermined rate.

## 2015-07-29 NOTE — Assessment & Plan Note (Signed)
Stable and currently maintained on atorvastatin. No obvious adverse effects. Continue current dosage of atorvastatin with consideration for possible discontinuation.

## 2015-07-31 ENCOUNTER — Telehealth: Payer: Self-pay | Admitting: Family

## 2015-07-31 NOTE — Telephone Encounter (Signed)
Chad Delacruz is looking for verbals on PT for 1 week 1, then 2 week 6.

## 2015-08-02 NOTE — Telephone Encounter (Signed)
Called back and gave verbal ok.  

## 2015-08-19 ENCOUNTER — Telehealth: Payer: Self-pay

## 2015-08-19 NOTE — Telephone Encounter (Signed)
FL2 received, completed and signed by Terri Piedra. Left message on machine for [name] advising paperwork had been placed in cabinet for pick up.

## 2015-08-28 ENCOUNTER — Telehealth: Payer: Self-pay | Admitting: Family

## 2015-08-28 NOTE — Telephone Encounter (Signed)
[name] called to advise that the [facility] is requesting a letterhead from [place] attesting that dementia is the patients main diagnosis.

## 2015-08-30 ENCOUNTER — Encounter: Payer: Self-pay | Admitting: Family

## 2015-08-30 NOTE — Telephone Encounter (Signed)
[name] is calling to follow up on this request. They are asking that it be sent in today. Please advise.

## 2015-08-30 NOTE — Telephone Encounter (Signed)
LVM letting daughter know. 

## 2015-08-30 NOTE — Telephone Encounter (Signed)
Letter written

## 2015-09-02 ENCOUNTER — Other Ambulatory Visit: Payer: Self-pay | Admitting: Family

## 2015-09-05 ENCOUNTER — Telehealth: Payer: Self-pay | Admitting: Family

## 2015-09-05 NOTE — Telephone Encounter (Signed)
Wants to know if Dr McDiarmind will take this patient.  Is a long term admission at [facility]. Is faxing FL2

## 2015-09-06 NOTE — Telephone Encounter (Signed)
LM for Dickey Gave, admissions director at [facility], asking her to call us back for more information.  Dr. McDiarmid is out of the office and we will need to check with him regarding this patient.  I also spoke with NP coordinator on this topic and she will relay message to Ms. Green when she calls back. Bodi Palmeri,CMA

## 2015-09-06 NOTE — Telephone Encounter (Signed)
Pt was assigned to another dr

## 2015-09-09 ENCOUNTER — Non-Acute Institutional Stay (SKILLED_NURSING_FACILITY): Payer: Medicare Other | Admitting: Internal Medicine

## 2015-09-09 ENCOUNTER — Encounter: Payer: Self-pay | Admitting: Internal Medicine

## 2015-09-09 DIAGNOSIS — I10 Essential (primary) hypertension: Secondary | ICD-10-CM | POA: Diagnosis not present

## 2015-09-09 DIAGNOSIS — R2681 Unsteadiness on feet: Secondary | ICD-10-CM

## 2015-09-09 DIAGNOSIS — M150 Primary generalized (osteo)arthritis: Secondary | ICD-10-CM | POA: Diagnosis not present

## 2015-09-09 DIAGNOSIS — F0391 Unspecified dementia with behavioral disturbance: Secondary | ICD-10-CM

## 2015-09-09 DIAGNOSIS — N183 Chronic kidney disease, stage 3 (moderate): Secondary | ICD-10-CM | POA: Diagnosis not present

## 2015-09-09 DIAGNOSIS — E785 Hyperlipidemia, unspecified: Secondary | ICD-10-CM

## 2015-09-09 DIAGNOSIS — M159 Polyosteoarthritis, unspecified: Secondary | ICD-10-CM

## 2015-09-09 NOTE — Progress Notes (Signed)
Patient ID: Chad Delacruz, male   DOB: [birth year], 82 y.o.   MRN: [id number]    HISTORY AND PHYSICAL   DATE: 09/09/15  Location:  [facility]    Place of Service: SNF (31)   Extended Emergency Contact Information Primary Emergency Contact: Manges,Louanna Address: [address]          [place], [place] [address] [place] Phone: [phone] Relation: Spouse Secondary Emergency Contact: Storti,[name]  [place] Phone: [phone] Relation: Daughter  Advanced Directive information  DNR  Chief Complaint  Patient presents with  . New Admit To SNF    HPI:  82 yo male seen today for admission into SNF from home. No c/o. He states he is coming from church. He is a poor historian due to dementia. Hx obtained from chart   Dementia - he has behavioral disturbance. Takes seroquel and citalopram. Uses xanax prn  HTN - BP stable on lopressor, lisinopril. Edema stable on lasix  Hyperlipidemia - stable on lipitor  CKD - stage 3. Cr 1.4 in  Dec 2016  Hx MVP/MR - stable on lisinopril and BB  Hx esophageal stricture/hiatal hernia/PUD - asymptomatic. Takes no meds  OA - pain stable on Tylenol  Hx BPH - stable.takes no meds  He takes vitamins daily.    Past Medical History  Diagnosis Date  . HYPERLIPIDEMIA   . MITRAL REGURGITATION   . Unspecified essential hypertension   . MITRAL VALVE PROLAPSE   . ESOPHAGEAL STRICTURE   . PUD   . OSTEOARTHRITIS   . GASTROINTESTINAL HEMORRHAGE, HX OF   . BENIGN PROSTATIC HYPERTROPHY, HX OF   . Dementia   . HIATAL HERNIA     Past Surgical History  Procedure Laterality Date  . Hemorrhoid surgery    . Circumcision    . Mitral valve repair  05/07/2009    Dr. Roxy Manns  . Hernia repair  03-31-12    BIH repair   . Hernia repair  03/2012    Patient Care Team: Golden Circle, FNP as PCP - General (Family Medicine) Sherren Mocha, MD as Consulting Physician (Cardiology)  Social  History   Social History  . Marital Status: Married    Spouse Name: N/A  . Number of Children: 4  . Years of Education: 12   Occupational History  . Retired    Social History Main Topics  . Smoking status: Never Smoker   . Smokeless tobacco: Never Used  . Alcohol Use: No  . Drug Use: No  . Sexual Activity: Not Currently   Other Topics Concern  . Not on file   Social History Narrative   11th grade. Married 1950 - life sentence. 3 dtrs - '49, '50, '52; 2 sons - '51, '75. He had children by three women. 4 grandchildren. 1 great-grand.   He previously worked for [employer] 25 years, retired from that. Worked as a Development worker, community but totally retired in 2011. End of Life care - wishes full resuscitation and intensive treatment to include mechanical ventilation.      Currently living at home     reports that he has never smoked. He has never used smokeless tobacco. He reports that he does not drink alcohol or use illicit drugs.  Family History  Problem Relation Age of Onset  . Hypertension Mother   . Hypertension Father    Family Status  Relation Status Death Age  . Mother Deceased 78  . Father Deceased 52  .  Maternal Grandmother Deceased   . Maternal Grandfather Deceased   . Paternal Grandmother Deceased   . Paternal Grandfather Deceased     Immunization History  Administered Date(s) Administered  . Influenza Split 04/21/2012  . Influenza-Unspecified 05/20/2013  . PPD Test 08/22/2012  . Tdap 07/29/2012    Allergies  Allergen Reactions  . Aspirin Other (See Comments)    REACTION: BLEEDING    Medications: Patient's Medications  New Prescriptions   No medications on file  Previous Medications   ACETAMINOPHEN (PAIN RELIEF) 500 MG TABLET    Take 1 tablet (500 mg total) by mouth 3 (three) times daily. For pain   ALPRAZOLAM (XANAX) 0.25 MG TABLET    TAKE 1 TABLET BY MOUTH TWICE A DAY AS NEEDED FOR ANXIETY   ATORVASTATIN (LIPITOR) 20 MG TABLET    Take 1 tablet  (20 mg total) by mouth daily.   CHOLECALCIFEROL (VITAMIN D) 1000 UNITS TABLET    Take 1,000 Units by mouth daily.   CITALOPRAM (CELEXA) 20 MG TABLET    Take 1 tablet (20 mg total) by mouth daily.   FUROSEMIDE (LASIX) 40 MG TABLET    Take 1 tablet (40 mg total) by mouth daily.   GUAIFENESIN (ROBITUSSIN) 100 MG/5ML SYRUP    Take 200 mg by mouth every 6 (six) hours as needed for cough.    LISINOPRIL (PRINIVIL,ZESTRIL) 20 MG TABLET    Take 1 tablet (20 mg total) by mouth daily.   METOPROLOL TARTRATE (LOPRESSOR) 25 MG TABLET    Take 1 tablet (25 mg total) by mouth 2 (two) times daily.   PRESCRIPTION MEDICATION    Apply 1 application topically every evening. Apply antibiotic ointment to blister on left big toe and cover with dressing daily until healed   QUETIAPINE (SEROQUEL) 50 MG TABLET    Take 1 tablet (50 mg total) by mouth daily.  Modified Medications   No medications on file  Discontinued Medications   No medications on file    Review of Systems  Unable to perform ROS: Dementia    Filed Vitals:   09/09/15 1133  BP: 115/76  Pulse: 57  Temp: 97.4 F (36.3 C)   There is no weight on file to calculate BMI.  Physical Exam  Constitutional: He appears well-developed and well-nourished.  Sitting in w/c in NAD  HENT:  Mouth/Throat: Oropharynx is clear and moist.  Eyes: Pupils are equal, round, and reactive to light. No scleral icterus.  Neck: Neck supple. Carotid bruit is not present. No thyromegaly present.  Cardiovascular: Normal rate, regular rhythm, normal heart sounds and intact distal pulses.  Exam reveals no gallop and no friction rub.   No murmur heard. no distal LE swelling. No calf TTP  Pulmonary/Chest: Effort normal and breath sounds normal. He has no wheezes. He has no rales. He exhibits no tenderness.  Abdominal: Soft. Bowel sounds are normal. He exhibits no distension, no abdominal bruit, no pulsatile midline mass and no mass. There is no tenderness. There is no rebound  and no guarding.  Musculoskeletal: He exhibits edema (small and large joint).  Lymphadenopathy:    He has no cervical adenopathy.  Neurological: He is alert.  Skin: Skin is warm and dry. No rash noted.  Psychiatric: He has a normal mood and affect. His behavior is normal.     Labs reviewed: Admission on 07/16/2015, Discharged on 07/18/2015  Component Date Value Ref Range Status  . Color, Urine 07/17/2015 YELLOW  YELLOW Final  . APPearance 07/17/2015 CLEAR  CLEAR Final  . Specific Gravity, Urine 07/17/2015 1.019  1.005 - 1.030 Final  . pH 07/17/2015 5.5  5.0 - 8.0 Final  . Glucose, UA 07/17/2015 NEGATIVE  NEGATIVE mg/dL Final  . Hgb urine dipstick 07/17/2015 NEGATIVE  NEGATIVE Final  . Bilirubin Urine 07/17/2015 NEGATIVE  NEGATIVE Final  . Ketones, ur 07/17/2015 NEGATIVE  NEGATIVE mg/dL Final  . Protein, ur 07/17/2015 NEGATIVE  NEGATIVE mg/dL Final  . Nitrite 07/17/2015 NEGATIVE  NEGATIVE Final  . Leukocytes, UA 07/17/2015 NEGATIVE  NEGATIVE Final   MICROSCOPIC NOT DONE ON URINES WITH NEGATIVE PROTEIN, BLOOD, LEUKOCYTES, NITRITE, OR GLUCOSE <1000 mg/dL.  . WBC 07/16/2015 4.0  4.0 - 10.5 K/uL Final  . RBC 07/16/2015 4.01* 4.22 - 5.81 MIL/uL Final  . Hemoglobin 07/16/2015 12.3* 13.0 - 17.0 g/dL Final  . HCT 07/16/2015 38.3* 39.0 - 52.0 % Final  . MCV 07/16/2015 95.5  78.0 - 100.0 fL Final  . MCH 07/16/2015 30.7  26.0 - 34.0 pg Final  . MCHC 07/16/2015 32.1  30.0 - 36.0 g/dL Final  . RDW 07/16/2015 12.7  11.5 - 15.5 % Final  . Platelets 07/16/2015 147* 150 - 400 K/uL Final  . Neutrophils Relative % 07/16/2015 62   Final  . Neutro Abs 07/16/2015 2.5  1.7 - 7.7 K/uL Final  . Lymphocytes Relative 07/16/2015 31   Final  . Lymphs Abs 07/16/2015 1.3  0.7 - 4.0 K/uL Final  . Monocytes Relative 07/16/2015 5   Final  . Monocytes Absolute 07/16/2015 0.2  0.1 - 1.0 K/uL Final  . Eosinophils Relative 07/16/2015 2   Final  . Eosinophils Absolute 07/16/2015 0.1  0.0 - 0.7 K/uL Final  .  Basophils Relative 07/16/2015 0   Final  . Basophils Absolute 07/16/2015 0.0  0.0 - 0.1 K/uL Final  . Sodium 07/16/2015 141  135 - 145 mmol/L Final  . Potassium 07/16/2015 3.7  3.5 - 5.1 mmol/L Final  . Chloride 07/16/2015 105  101 - 111 mmol/L Final  . CO2 07/16/2015 27  22 - 32 mmol/L Final  . Glucose, Bld 07/16/2015 95  65 - 99 mg/dL Final  . BUN 07/16/2015 25* 6 - 20 mg/dL Final  . Creatinine, Ser 07/16/2015 1.49* 0.61 - 1.24 mg/dL Final  . Calcium 07/16/2015 8.8* 8.9 - 10.3 mg/dL Final  . Total Protein 07/16/2015 6.1* 6.5 - 8.1 g/dL Final  . Albumin 07/16/2015 3.7  3.5 - 5.0 g/dL Final  . AST 07/16/2015 15  15 - 41 U/L Final  . ALT 07/16/2015 9* 17 - 63 U/L Final  . Alkaline Phosphatase 07/16/2015 68  38 - 126 U/L Final  . Total Bilirubin 07/16/2015 0.5  0.3 - 1.2 mg/dL Final  . GFR calc non Af Amer 07/16/2015 41* >60 mL/min Final  . GFR calc Af Amer 07/16/2015 47* >60 mL/min Final   Comment: (NOTE) The eGFR has been calculated using the CKD EPI equation. This calculation has not been validated in all clinical situations. eGFR's persistently <60 mL/min signify possible Chronic Kidney Disease.   . Anion gap 07/16/2015 9  5 - 15 Final    No results found.   Assessment/Plan   ICD-9-CM ICD-10-CM   1. Dementia, with behavioral disturbance 294.21 F03.91   2. Essential hypertension 401.9 I10   3. Hyperlipidemia 272.4 E78.5   4. Primary osteoarthritis involving multiple joints 715.09 M15.0   5. Unstable gait 781.2 R26.81   6. CKD (chronic kidney disease), stage 3 (moderate) 585.3 N18.3     PT/OT/ST  as ordered  Fall precautions  Cont current meds as ordered  Check CMP q6 mos to monitor long term high risk med use  GOAL: long term care. Communicated with pt and nursing.  Will follow  Jaire Pinkham S. Perlie Gold  [facility] [address] [place], [place] [address] [phone] Cell (Monday-Friday 8 AM - 5 PM) [phone]  After 5 PM and follow prompts

## 2015-10-07 ENCOUNTER — Telehealth: Payer: Self-pay

## 2015-10-07 DIAGNOSIS — G3183 Dementia with Lewy bodies: Secondary | ICD-10-CM | POA: Diagnosis not present

## 2015-10-07 NOTE — Telephone Encounter (Signed)
Home Health Cert/Plan of Care received (07/18/2015 - 09/15/2015) and placed on MD's desk for signature

## 2015-10-09 NOTE — Telephone Encounter (Signed)
Paperwork signed, faxed, copy sent to scan 

## 2015-10-16 ENCOUNTER — Non-Acute Institutional Stay (SKILLED_NURSING_FACILITY): Payer: Medicare Other | Admitting: Adult Health

## 2015-10-16 ENCOUNTER — Encounter: Payer: Self-pay | Admitting: Adult Health

## 2015-10-16 DIAGNOSIS — E785 Hyperlipidemia, unspecified: Secondary | ICD-10-CM | POA: Diagnosis not present

## 2015-10-16 DIAGNOSIS — M159 Polyosteoarthritis, unspecified: Secondary | ICD-10-CM

## 2015-10-16 DIAGNOSIS — I872 Venous insufficiency (chronic) (peripheral): Secondary | ICD-10-CM | POA: Diagnosis not present

## 2015-10-16 DIAGNOSIS — M150 Primary generalized (osteo)arthritis: Secondary | ICD-10-CM | POA: Diagnosis not present

## 2015-10-16 DIAGNOSIS — F0391 Unspecified dementia with behavioral disturbance: Secondary | ICD-10-CM | POA: Diagnosis not present

## 2015-10-16 DIAGNOSIS — I080 Rheumatic disorders of both mitral and aortic valves: Secondary | ICD-10-CM | POA: Diagnosis not present

## 2015-10-16 DIAGNOSIS — I10 Essential (primary) hypertension: Secondary | ICD-10-CM

## 2015-10-16 NOTE — Progress Notes (Signed)
Patient ID: Chad Delacruz, male   DOB: Jun 19, 1927, 82 y.o.   MRN: [id number]  Location:  [facility] Room Number: [place] of Service:  SNF (31) Provider:   Cindi Carbon, [place] [phone]   Gildardo Cranker, DO  Patient Care Team: Gildardo Cranker, DO as PCP - General (Internal Medicine)  Extended Emergency Contact Information Primary Emergency Contact: Wishon,Louanna Address: [address]          [place], [place] [address] [place] Phone: [phone] Relation: Spouse Secondary Emergency Contact: Fratus,[name]  [place] Phone: [phone] Relation: Daughter  Goals of care: Advanced Directive information Advanced Directives 10/16/2015  Does patient have an advance directive? Yes  Type of Advance Directive Out of facility DNR (pink MOST or yellow form)  Does patient want to make changes to advanced directive? No - Patient declined  Copy of advanced directive(s) in chart? Yes  Would patient like information on creating an advanced directive? -  Pre-existing out of facility DNR order (yellow form or pink MOST form) -     Chief Complaint  Patient presents with  . Medical Management of Chronic Issues    Routine Visit    HPI:  Pt is a 82 y.o. male seen today for medical management of chronic diseases.  Residing in skilled care at [facility] due to advancing dementia. Resident recently seen by the ophthalmologist and prescribed Tobramycin, due for follow up this week. Resident has no complaints today.  Has periods of combativeness, depakote increased to 250 mg on 3/22 . Nsg notes do not indicate any further issues, pleasant for our visit today.  1. Dementia, with behavioral disturbance -currently on depakote, seroquel, and trazodone -advanced, remains verbal, minimally ambulatory -pleasant, no reports of combativeness  2. Essential hypertension -controlled for his age -currently on  lasix, lisinopril, and metoprolol  3. Primary osteoarthritis involving multiple joints -reports pain at time to various joints (hips, knees) -on scheduled tylenol, which seems to help  4. MITRAL REGURGITATION -s/p repair in 2010 -cant take asa or plavix due to PUD hx -echo in 2013 mentions aortic regurg moderate, EF mildly reduced at 45-50% -previously followed by Dr. Burt Knack  5. Chronic venous insufficiency -has mild edema to his lower ext -on daily lasix and compression hose  Past Medical History  Diagnosis Date  . HYPERLIPIDEMIA   . MITRAL REGURGITATION   . Unspecified essential hypertension   . MITRAL VALVE PROLAPSE   . ESOPHAGEAL STRICTURE   . PUD   . OSTEOARTHRITIS   . GASTROINTESTINAL HEMORRHAGE, HX OF   . BENIGN PROSTATIC HYPERTROPHY, HX OF   . Dementia   . HIATAL HERNIA    Past Surgical History  Procedure Laterality Date  . Hemorrhoid surgery    . Circumcision    . Mitral valve repair  05/07/2009    Dr. Roxy Manns  . Hernia repair  03-31-12    BIH repair   . Hernia repair  03/2012    Allergies  Allergen Reactions  . Aspirin Other (See Comments)    REACTION: BLEEDING      Medication List       This list is accurate as of: 10/16/15 12:02 PM.  Always use your most recent med list.               acetaminophen 500 MG tablet  Commonly known as:  PAIN RELIEF  Take 1 tablet (500 mg total) by mouth 3 (three) times  daily. For pain     ALPRAZolam 0.25 MG tablet  Commonly known as:  XANAX  TAKE 1 TABLET BY MOUTH TWICE A DAY AS NEEDED FOR ANXIETY     atorvastatin 20 MG tablet  Commonly known as:  LIPITOR  Take 1 tablet (20 mg total) by mouth daily.     cholecalciferol 1000 units tablet  Commonly known as:  VITAMIN D  Take 1,000 Units by mouth daily.     citalopram 20 MG tablet  Commonly known as:  CELEXA  Take 1 tablet (20 mg total) by mouth daily.     divalproex 125 MG capsule  Commonly known as:  DEPAKOTE SPRINKLE  Take 250 mg by mouth at bedtime.      furosemide 40 MG tablet  Commonly known as:  LASIX  Take 1 tablet (40 mg total) by mouth daily.     guaifenesin 100 MG/5ML syrup  Commonly known as:  ROBITUSSIN  Take 200 mg by mouth every 6 (six) hours as needed for cough.     lisinopril 20 MG tablet  Commonly known as:  PRINIVIL,ZESTRIL  Take 1 tablet (20 mg total) by mouth daily.     metoprolol tartrate 25 MG tablet  Commonly known as:  LOPRESSOR  Take 1 tablet (25 mg total) by mouth 2 (two) times daily.     QUEtiapine 50 MG tablet  Commonly known as:  SEROQUEL  Take 1 tablet (50 mg total) by mouth daily.     REFRESH OP  Place 2 drops into the right eye 4 (four) times daily.     tobramycin 0.3 % ophthalmic solution  Commonly known as:  TOBREX  Place 1 drop into the right eye every 4 (four) hours. For three (3) weeks. Stop date: 10/29/15     traZODone 50 MG tablet  Commonly known as:  DESYREL  Take 50 mg by mouth at bedtime.        Review of Systems  Constitutional: Negative for fever, chills, diaphoresis, activity change, appetite change, fatigue and unexpected weight change.  HENT: Negative for congestion.   Respiratory: Negative for cough, shortness of breath and wheezing.   Cardiovascular: Positive for leg swelling. Negative for chest pain and palpitations.  Gastrointestinal: Negative for diarrhea, constipation and abdominal distention.  Genitourinary: Negative for dysuria.  Musculoskeletal: Positive for arthralgias and gait problem. Negative for back pain.  Neurological: Negative for dizziness and facial asymmetry.  Psychiatric/Behavioral: Positive for behavioral problems, confusion and agitation.    Immunization History  Administered Date(s) Administered  . Influenza Split 04/21/2012  . Influenza-Unspecified 05/20/2013  . PPD Test 08/22/2012, 09/06/2015  . Tdap 07/29/2012   Pertinent  Health Maintenance Due  Topic Date Due  . PNA vac Low Risk Adult (1 of 2 - PCV13) 02/08/1994  . INFLUENZA VACCINE   02/18/2015   Fall Risk  06/07/2013  Falls in the past year? No   Functional Status Survey:    Filed Vitals:   10/16/15 1135  BP: 141/77  Pulse: 60  Temp: 98 F (36.7 C)  TempSrc: Oral  Resp: 20  Height: 5\' 9"  (1.753 m)  Weight: 177 lb 6.4 oz (80.468 kg)   Body mass index is 26.19 kg/(m^2).  Wt Readings from Last 3 Encounters:  10/16/15 177 lb 6.4 oz (80.468 kg)  08/29/13 178 lb 6.4 oz (80.922 kg)  08/21/13 181 lb (82.101 kg)    Physical Exam  Constitutional: No distress.  HENT:  Head: Normocephalic and atraumatic.  Neck: Normal range of  motion. Neck supple. No JVD present. No tracheal deviation present. No thyromegaly present.  Cardiovascular: Normal rate and regular rhythm.   No murmur heard. BLE edema trace to +1  Pulmonary/Chest: Effort normal and breath sounds normal. No respiratory distress.  Abdominal: Soft. Bowel sounds are normal. He exhibits no distension.  Lymphadenopathy:    He has no cervical adenopathy.  Neurological: He is alert.  Oriented to self and situation only, able to f/c  Skin: Skin is warm and dry. He is not diaphoretic.  Psychiatric: He has a normal mood and affect.    Labs reviewed:  Recent Labs  07/16/15 2323  NA 141  K 3.7  CL 105  CO2 27  GLUCOSE 95  BUN 25*  CREATININE 1.49*  CALCIUM 8.8*    Recent Labs  07/16/15 2323  AST 15  ALT 9*  ALKPHOS 68  BILITOT 0.5  PROT 6.1*  ALBUMIN 3.7    Recent Labs  07/16/15 2323  WBC 4.0  NEUTROABS 2.5  HGB 12.3*  HCT 38.3*  MCV 95.5  PLT 147*   Lab Results  Component Value Date   TSH 1.12 01/20/2012   Lab Results  Component Value Date   HGBA1C  05/03/2009    5.4 (NOTE) The ADA recommends the following therapeutic goal for glycemic control related to Hgb A1c measurement: Goal of therapy: <6.5 Hgb A1c  Reference: American Diabetes Association: Clinical Practice Recommendations 2010, Diabetes Care, 2010, 33: (Suppl  1).   Lab Results  Component Value Date   CHOL 126  10/12/2012   HDL 54.40 10/12/2012   LDLCALC 60 10/12/2012   LDLDIRECT 142.6 01/29/2011   TRIG 59.0 10/12/2012   CHOLHDL 2 10/12/2012    Significant Diagnostic Results in last 30 days:  No results found.  Assessment/Plan  1. Dementia, with behavioral disturbance -continue current regimen -consider namenda   2. Essential hypertension -continue current regimen -monitor renal function, currently stage 3  3. Primary osteoarthritis involving multiple joints -stable, continue tylenol   4. MITRAL REGURGITATION -s/p repair in 2010 -I do no appreciate a murmur today -needs follow up with cards, noted aortic regurg on echo in 2013 -will have staff set up with Dr. Burt Knack  5. Chronic venous insufficiency -continue lasix -elevation -compression hose   Family/ staff Communication: discussed with nurse  Labs/tests ordered:  NA  Cindi Carbon, ANP [facility] [phone]

## 2015-10-19 DIAGNOSIS — I872 Venous insufficiency (chronic) (peripheral): Secondary | ICD-10-CM | POA: Insufficient documentation

## 2015-10-29 LAB — HEPATIC FUNCTION PANEL
ALT: 12 U/L (ref 10–40)
AST: 15 U/L (ref 14–40)
Alkaline Phosphatase: 51 U/L (ref 25–125)
BILIRUBIN DIRECT: 0.3 mg/dL (ref 0.01–0.4)
BILIRUBIN, TOTAL: 1.4 mg/dL

## 2015-10-29 LAB — CBC AND DIFFERENTIAL
HCT: 41 % (ref 41–53)
Hemoglobin: 13.3 g/dL — AB (ref 13.5–17.5)
Platelets: 173 10*3/uL (ref 150–399)
WBC: 5.4 10^3/mL

## 2015-11-05 NOTE — Progress Notes (Signed)
  ROS  This encounter was created in error - please disregard. 

## 2015-11-06 ENCOUNTER — Encounter: Payer: Self-pay | Admitting: Physician Assistant

## 2015-11-11 ENCOUNTER — Encounter: Payer: Self-pay | Admitting: Internal Medicine

## 2015-11-11 ENCOUNTER — Non-Acute Institutional Stay (SKILLED_NURSING_FACILITY): Payer: Medicare Other | Admitting: Internal Medicine

## 2015-11-11 DIAGNOSIS — Z952 Presence of prosthetic heart valve: Secondary | ICD-10-CM

## 2015-11-11 DIAGNOSIS — R2681 Unsteadiness on feet: Secondary | ICD-10-CM

## 2015-11-11 DIAGNOSIS — M150 Primary generalized (osteo)arthritis: Secondary | ICD-10-CM | POA: Diagnosis not present

## 2015-11-11 DIAGNOSIS — N183 Chronic kidney disease, stage 3 (moderate): Secondary | ICD-10-CM

## 2015-11-11 DIAGNOSIS — Z954 Presence of other heart-valve replacement: Secondary | ICD-10-CM | POA: Diagnosis not present

## 2015-11-11 DIAGNOSIS — I10 Essential (primary) hypertension: Secondary | ICD-10-CM | POA: Diagnosis not present

## 2015-11-11 DIAGNOSIS — E785 Hyperlipidemia, unspecified: Secondary | ICD-10-CM | POA: Diagnosis not present

## 2015-11-11 DIAGNOSIS — M159 Polyosteoarthritis, unspecified: Secondary | ICD-10-CM

## 2015-11-11 DIAGNOSIS — F0391 Unspecified dementia with behavioral disturbance: Secondary | ICD-10-CM | POA: Diagnosis not present

## 2015-11-11 NOTE — Progress Notes (Signed)
Patient ID: Chad Delacruz, male   DOB: 11/02/1926, 82 y.o.   MRN: [id number]    DATE: 11/11/15  Location:  [facility] Room Number: [id number] Place of Service: SNF (31)   Extended Emergency Contact Information Primary Emergency Contact: Ambrocio,Louanna Address: [address]          [place], [place] [address] [place] Phone: [phone] Relation: Spouse Secondary Emergency Contact: Yinger,[name]  [place] Phone: [phone] Relation: Daughter  Advanced Directive information Does patient have an advance directive?: Yes, Type of Advance Directive: Healthcare Power of [place];Living will;Advance instruction for mental health treatment;Out of facility DNR (pink MOST or yellow form), Does patient want to make changes to advanced directive?: No - Patient declined  Chief Complaint  Patient presents with  . Medical Management of Chronic Issues    Routine Visit    HPI:  82 yo male short term resident seen today for f/u. He has no c/o. No nursing issues. No falls. He is a poor historian due to dementia. Hx obtained from chart   Dementia - advanced. he has behavioral disturbance. Takes seroquel and citalopram. Uses xanax prn  HTN - BP stable on lopressor, lisinopril. Edema stable on lasix  Hyperlipidemia - stable on lipitor  CKD - stage 3. Cr 1.4 in  Dec 2016  Hx MVP/MR - stable on lisinopril and BB. He is s/p MVR  Hx AR - moderate per last 2D echo. EF 45-50% in 2013  Hx esophageal stricture/hiatal hernia/PUD - asymptomatic. Takes no meds  OA - pain stable on Tylenol  Hx BPH - stable.takes no meds  He takes vitamins daily.   Past Medical History  Diagnosis Date  . HYPERLIPIDEMIA   . MITRAL REGURGITATION   . Unspecified essential hypertension   . MITRAL VALVE PROLAPSE   . ESOPHAGEAL STRICTURE   . PUD   . OSTEOARTHRITIS   . GASTROINTESTINAL HEMORRHAGE, HX OF   . BENIGN PROSTATIC HYPERTROPHY, HX OF   .  Dementia   . HIATAL HERNIA     Past Surgical History  Procedure Laterality Date  . Hemorrhoid surgery    . Circumcision    . Mitral valve repair  05/07/2009    Dr. Roxy Manns  . Hernia repair  03-31-12    BIH repair   . Hernia repair  03/2012    Patient Care Team: [place], FNP as PCP - General (Family Medicine)  Social History   Social History  . Marital Status: Married    Spouse Name: N/A  . Number of Children: 4  . Years of Education: 12   Occupational History  . Retired    Social History Main Topics  . Smoking status: Never Smoker   . Smokeless tobacco: Never Used  . Alcohol Use: No  . Drug Use: No  . Sexual Activity: Not Currently   Other Topics Concern  . Not on file   Social History Narrative   11th grade. Married 1950 - life sentence. 3 dtrs - '49, '50, '52; 2 sons - '51, '75. He had children by three women. 4 grandchildren. 1 great-grand.   He previously worked for [employer] 25 years, retired from that. Worked as a Development worker, community but totally retired in 2011. End of Life care - wishes full resuscitation and intensive treatment to include mechanical ventilation.      Currently living at home     reports that he has never smoked. He has never  used smokeless tobacco. He reports that he does not drink alcohol or use illicit drugs.  Immunization History  Administered Date(s) Administered  . Influenza Split 04/21/2012  . Influenza-Unspecified 05/20/2013  . PPD Test 08/22/2012, 09/06/2015  . Tdap 07/29/2012    Allergies  Allergen Reactions  . Aspirin Other (See Comments)    REACTION: BLEEDING    Medications: Patient's Medications  New Prescriptions   No medications on file  Previous Medications   ACETAMINOPHEN (PAIN RELIEF) 500 MG TABLET    Take 1 tablet (500 mg total) by mouth 3 (three) times daily. For pain   ALPRAZOLAM (XANAX) 0.25 MG TABLET    TAKE 1 TABLET BY MOUTH TWICE A DAY AS NEEDED FOR ANXIETY   ATORVASTATIN (LIPITOR) 20 MG TABLET     Take 1 tablet (20 mg total) by mouth daily.   CHOLECALCIFEROL (VITAMIN D) 1000 UNITS TABLET    Take 1,000 Units by mouth daily.   CITALOPRAM (CELEXA) 20 MG TABLET    Take 1 tablet (20 mg total) by mouth daily.   DIVALPROEX (DEPAKOTE) 125 MG DR TABLET    Give 6 capsules (750 mg total) by mouth every evening at bedtime for mood disorder.   FUROSEMIDE (LASIX) 40 MG TABLET    Take 1 tablet (40 mg total) by mouth daily.   GUAIFENESIN (ROBITUSSIN) 100 MG/5ML SYRUP    Take 200 mg by mouth every 6 (six) hours as needed for cough.    LISINOPRIL (PRINIVIL,ZESTRIL) 20 MG TABLET    Take 1 tablet (20 mg total) by mouth daily.   METOPROLOL TARTRATE (LOPRESSOR) 25 MG TABLET    Take 1 tablet (25 mg total) by mouth 2 (two) times daily.   POLYVINYL ALCOHOL-POVIDONE (REFRESH OP)    Place 2 drops into the right eye 4 (four) times daily.   QUETIAPINE (SEROQUEL) 50 MG TABLET    Take 1 tablet (50 mg total) by mouth daily.   TRAZODONE (DESYREL) 50 MG TABLET    Take 50 mg by mouth at bedtime.  Modified Medications   No medications on file  Discontinued Medications   DIVALPROEX (DEPAKOTE SPRINKLE) 125 MG CAPSULE    Take 250 mg by mouth at bedtime.   TOBRAMYCIN (TOBREX) 0.3 % OPHTHALMIC SOLUTION    Place 1 drop into the right eye every 4 (four) hours. For three (3) weeks. Stop date: 10/29/15    Review of Systems  Unable to perform ROS: Dementia    Filed Vitals:   11/11/15 1131  BP: 141/77  Pulse: 60  Temp: 97.6 F (36.4 C)  TempSrc: Oral  Resp: 20  Height: 5\' 9"  (1.753 m)  Weight: 176 lb (79.833 kg)   Body mass index is 25.98 kg/(m^2).  Physical Exam  Constitutional: He appears well-developed.  Lying in bed watching TV in NAD, frail appearing.   HENT:  Mouth/Throat: Oropharynx is clear and moist.  Eyes: Pupils are equal, round, and reactive to light. No scleral icterus.  Neck: Neck supple. Carotid bruit is not present. No thyromegaly present.  Cardiovascular: Normal rate and intact distal pulses.  An  irregular rhythm present. Exam reveals no gallop and no friction rub.   Murmur heard.  Systolic murmur is present with a grade of 1/6  no distal LE swelling. No calf TTP  Pulmonary/Chest: Effort normal and breath sounds normal. He has no wheezes. He has no rales. He exhibits no tenderness.  Abdominal: Soft. Bowel sounds are normal. He exhibits no distension, no abdominal bruit, no pulsatile midline mass  and no mass. There is no tenderness. There is no rebound and no guarding.  Musculoskeletal: He exhibits edema (small and large joint).  Lymphadenopathy:    He has no cervical adenopathy.  Neurological: He is alert.  Skin: Skin is warm and dry. No rash noted.  Psychiatric: He has a normal mood and affect. His behavior is normal.     Labs reviewed: Nursing Home on 11/11/2015  Component Date Value Ref Range Status  . Hemoglobin 10/29/2015 13.3* 13.5 - 17.5 g/dL Final  . HCT 10/29/2015 41  41 - 53 % Final  . Platelets 10/29/2015 173  150 - 399 K/L Final  . WBC 10/29/2015 5.4   Final  . Alkaline Phosphatase 10/29/2015 51  25 - 125 U/L Final  . ALT 10/29/2015 12  10 - 40 U/L Final  . AST 10/29/2015 15  14 - 40 U/L Final  . Bilirubin, Direct 10/29/2015 0.3  0.01 - 0.4 mg/dL Final  . Bilirubin, Total 10/29/2015 1.4   Final    No results found.   Assessment/Plan   ICD-9-CM ICD-10-CM   1. Dementia, with behavioral disturbance 294.21 F03.91   2. Unstable gait 781.2 R26.81   3. Essential hypertension 401.9 I10   4. S/P mitral valve replacement V43.3 Z95.4    due to mitral regurgitation  5. Hyperlipidemia 272.4 E78.5   6. CKD (chronic kidney disease), stage 3 (moderate) 585.3 N18.3   7. Primary osteoarthritis involving multiple joints 715.09 M15.0     Cont current meds as ordered  PT/OT/ST as ordered  F/u with cardio as scheduled  Fall precautions  Potential long term care  Will follow  Shakeerah Gradel S. Perlie Gold  [facility] [address] [place], [place] [address] [phone] Cell (Monday-Friday 8 AM - 5 PM) [phone] After 5 PM and follow prompts

## 2015-11-27 NOTE — Progress Notes (Signed)
Cardiology Office Note    Date:  11/29/2015   ID:  Chad Delacruz, DOB April 24, 1927, MRN [id number]  PCP:  Mauricio Po, FNP  Cardiologist:  Dr. Burt Knack   Follow up  History of Present Illness:  Chad Delacruz is a 82 y.o. male with a history of mitral regurgitation s/p MVR (2010), nonobstructive CAD, mild LV dysfunction and Alzheimer's disease  who presents to clinic for follow-up.   He has a history of severe mitral regurgitation status post MVR in 2010. Follow-up echocardiogram in 2014 showed EF 45-50% with hypokinesis of the inferoseptal myocardium. His mitral valve repair was intact with normal function. He was last seen by Dr. Burt Knack in 09/2012 for general cardiology follow-up. He was noted to have lower extremity edema which was felt to be multifactorial and related to cardiac disease as well as immobility and venous insufficiency. His furosemide was increased to 40 mg daily and he was advised to use compression stockings and leg elevation. He was supposed to follow-up in one year, but has not been seen since.   Today he presents to clinic for follow-up. He moved [facility] in 08/2015 for help in his care. He has pretty advance dementia and does not Provide a very good history. His wife is here with him today. He is a high fall risk and uses a wheelchair. He denies any chest pain or shortness of breath. His lower extremity edema is completely resolved on Lasix 40 mg daily.    Past Medical History  Diagnosis Date  . HYPERLIPIDEMIA   . MITRAL REGURGITATION   . Unspecified essential hypertension   . MITRAL VALVE PROLAPSE   . ESOPHAGEAL STRICTURE   . PUD   . OSTEOARTHRITIS   . GASTROINTESTINAL HEMORRHAGE, HX OF   . BENIGN PROSTATIC HYPERTROPHY, HX OF   . Dementia   . HIATAL HERNIA     Past Surgical History  Procedure Laterality Date  . Hemorrhoid surgery    . Circumcision    . Mitral valve repair  05/07/2009    Dr. Roxy Manns  . Hernia repair  03-31-12    BIH repair   .  Hernia repair  03/2012    Current Medications: Outpatient Prescriptions Prior to Visit  Medication Sig Dispense Refill  . acetaminophen (PAIN RELIEF) 500 MG tablet Take 1 tablet (500 mg total) by mouth 3 (three) times daily. For pain 90 tablet 2  . ALPRAZolam (XANAX) 0.25 MG tablet TAKE 1 TABLET BY MOUTH TWICE A DAY AS NEEDED FOR ANXIETY 30 tablet 0  . atorvastatin (LIPITOR) 20 MG tablet Take 1 tablet (20 mg total) by mouth daily. 90 tablet 1  . cholecalciferol (VITAMIN D) 1000 UNITS tablet Take 1,000 Units by mouth daily.    . citalopram (CELEXA) 20 MG tablet Take 1 tablet (20 mg total) by mouth daily. 30 tablet 2  . divalproex (DEPAKOTE) 125 MG DR tablet Give 6 capsules (750 mg total) by mouth every evening at bedtime for mood disorder.    . furosemide (LASIX) 40 MG tablet Take 1 tablet (40 mg total) by mouth daily. 30 tablet 2  . guaifenesin (ROBITUSSIN) 100 MG/5ML syrup Take 200 mg by mouth every 6 (six) hours as needed for cough.     Marland Kitchen lisinopril (PRINIVIL,ZESTRIL) 20 MG tablet Take 1 tablet (20 mg total) by mouth daily. 90 tablet 0  . metoprolol tartrate (LOPRESSOR) 25 MG tablet Take 1 tablet (25 mg total) by mouth 2 (two) times daily. 180 tablet 1  . Polyvinyl Alcohol-Povidone (  REFRESH OP) Place 2 drops into the right eye 4 (four) times daily.    . QUEtiapine (SEROQUEL) 50 MG tablet Take 1 tablet (50 mg total) by mouth daily. 30 tablet 2  . traZODone (DESYREL) 50 MG tablet Take 50 mg by mouth at bedtime.     No facility-administered medications prior to visit.     Allergies:   Aspirin   Social History   Social History  . Marital Status: Married    Spouse Name: N/A  . Number of Children: 4  . Years of Education: 12   Occupational History  . Retired    Social History Main Topics  . Smoking status: Never Smoker   . Smokeless tobacco: Never Used  . Alcohol Use: No  . Drug Use: No  . Sexual Activity: Not Currently   Other Topics Concern  . None   Social History  Narrative   11th grade. Married 1950 - life sentence. 3 dtrs - '49, '50, '52; 2 sons - '51, '75. He had children by three women. 4 grandchildren. 1 great-grand.   He previously worked for [employer] 25 years, retired from that. Worked as a Development worker, community but totally retired in 2011. End of Life care - wishes full resuscitation and intensive treatment to include mechanical ventilation.      Currently living at home     Family History:  The patient's family history includes Hypertension in his father and mother.   ROS:   Please see the history of present illness.    ROS All other systems reviewed and are negative.   PHYSICAL EXAM:   VS:  BP 114/72 mmHg  Pulse 63  Ht 5\' 9"  (1.753 m)  Wt 176 lb (79.833 kg)  BMI 25.98 kg/m2   GEN: Well nourished, well developed, in no acute distress, elderly and frail HEENT: normal Neck: no JVD, carotid bruits, or masses Cardiac: RRR; no murmurs, rubs, or gallops,no edema  Respiratory:  clear to auscultation bilaterally, normal work of breathing GI: soft, nontender, nondistended, + BS MS: no deformity or atrophy Skin: warm and dry, no rash Neuro:  Alert and Oriented x 3, Strength and sensation are intact Psych: euthymic mood, full affect  Wt Readings from Last 3 Encounters:  11/29/15 176 lb (79.833 kg)  11/11/15 176 lb (79.833 kg)  10/16/15 177 lb 6.4 oz (80.468 kg)      Studies/Labs Reviewed:   EKG:  EKG is ordered today.  The ekg ordered today demonstrates atrial flutter vs artifact HR 63  Recent Labs: 07/16/2015: BUN 25*; Creatinine, Ser 1.49*; Potassium 3.7; Sodium 141 10/29/2015: ALT 12; Hemoglobin 13.3*; Platelets 173   Lipid Panel    Component Value Date/Time   CHOL 126 10/12/2012 1716   TRIG 59.0 10/12/2012 1716   HDL 54.40 10/12/2012 1716   CHOLHDL 2 10/12/2012 1716   VLDL 11.8 10/12/2012 1716   LDLCALC 60 10/12/2012 1716   LDLDIRECT 142.6 01/29/2011 1458    Additional studies/ records that were reviewed today include:    2D ECHO: 02/02/2012  LV EF: 45% -  50%  Study Conclusions  - Left ventricle: The cavity size was normal. Systolic  function was mildly reduced. The estimated ejection  fraction was in the range of 45% to 50%. Hypokinesis of  the inferoseptal myocardium. Doppler parameters are  consistent with abnormal left ventricular relaxation  (grade 1 diastolic dysfunction).  - Aortic valve: Mild to moderate regurgitation.  - Left atrium: The atrium was moderately to severely  dilated.  -  Atrial septum: No defect or patent foramen ovale was  identified.     ASSESSMENT:    1. History of mitral valve repair   2. Coronary artery disease involving native coronary artery of native heart without angina pectoris   3. Asymptomatic LV dysfunction   4. Advanced dementia      PLAN:  In order of problems listed above:  Mitral regurgitation s/p MVR (2010): Valve sounds good on exam. He is clinically doing well. No changes.   Nonobstructive CAD: stable. Continue statin and beta blocker.   Mild LV dysfunction:  Continue Lopressor 25 mg twice a day and lisinopril 20 mg daily as well as Lasix 40 mg daily. Appears euvolemic.   Advanced dementia: Patient lives at [facility] facility now  Possible atrial flutter: EKG with flutter vs artifact. However, in this elderly, frail man with advanced dementia we will proceed conservatively. Not an anti-coagulation candidate due to high fall risk. CHADSCVASC score of at least 4 (CHF, age, CAD). His heart rate is well controlled currently and he is asymptomatic.   Medication Adjustments/Labs and Tests Ordered: Current medicines are reviewed at length with the patient today.  Concerns regarding medicines are outlined above.  Medication changes, Labs and Tests ordered today are listed in the Patient Instructions below. Patient Instructions  Medication Instructions:  Your physician recommends that you continue on your current medications as  directed. Please refer to the Current Medication list given to you today.   Labwork: None ordered  Testing/Procedures: None ordered  Follow-Up: Your physician wants you to follow-up in: [place] will receive a reminder letter in the mail two months in advance. If you don't receive a letter, please call our office to schedule the follow-up appointment.    Any Other Special Instructions Will Be Listed Below (If Applicable).   If you need a refill on your cardiac medications before your next appointment, please call your pharmacy.       Mable Fill, PA-C  11/29/2015 11:00 AM    [facility] [place], [place]  [address] Phone: [phone]; Fax: [phone]

## 2015-11-28 ENCOUNTER — Ambulatory Visit: Payer: Self-pay | Admitting: Physician Assistant

## 2015-11-29 ENCOUNTER — Ambulatory Visit (INDEPENDENT_AMBULATORY_CARE_PROVIDER_SITE_OTHER): Payer: Medicare Other | Admitting: Physician Assistant

## 2015-11-29 ENCOUNTER — Encounter: Payer: Self-pay | Admitting: Physician Assistant

## 2015-11-29 VITALS — BP 114/72 | HR 63 | Ht 69.0 in | Wt 176.0 lb

## 2015-11-29 DIAGNOSIS — F0390 Unspecified dementia without behavioral disturbance: Secondary | ICD-10-CM | POA: Diagnosis not present

## 2015-11-29 DIAGNOSIS — I2510 Atherosclerotic heart disease of native coronary artery without angina pectoris: Secondary | ICD-10-CM

## 2015-11-29 DIAGNOSIS — I519 Heart disease, unspecified: Secondary | ICD-10-CM

## 2015-11-29 DIAGNOSIS — Z9889 Other specified postprocedural states: Secondary | ICD-10-CM

## 2015-11-29 DIAGNOSIS — F03C0 Unspecified dementia, severe, without behavioral disturbance, psychotic disturbance, mood disturbance, and anxiety: Secondary | ICD-10-CM

## 2015-11-29 NOTE — Patient Instructions (Addendum)
Medication Instructions:  Your physician recommends that you continue on your current medications as directed. Please refer to the Current Medication list given to you today.   Labwork: None ordered  Testing/Procedures: None ordered  Follow-Up: Your physician wants you to follow-up in: 1 YEAR WITH DR. COOPER   You will receive a reminder letter in the mail two months in advance. If you don't receive a letter, please call our office to schedule the follow-up appointment.   Any Other Special Instructions Will Be Listed Below (If Applicable).     If you need a refill on your cardiac medications before your next appointment, please call your pharmacy.   

## 2015-12-05 ENCOUNTER — Encounter: Payer: Self-pay | Admitting: Adult Health

## 2015-12-05 ENCOUNTER — Non-Acute Institutional Stay (SKILLED_NURSING_FACILITY): Payer: Medicare Other | Admitting: Adult Health

## 2015-12-05 DIAGNOSIS — E785 Hyperlipidemia, unspecified: Secondary | ICD-10-CM | POA: Diagnosis not present

## 2015-12-05 DIAGNOSIS — R600 Localized edema: Secondary | ICD-10-CM | POA: Diagnosis not present

## 2015-12-05 DIAGNOSIS — F29 Unspecified psychosis not due to a substance or known physiological condition: Secondary | ICD-10-CM

## 2015-12-05 DIAGNOSIS — I10 Essential (primary) hypertension: Secondary | ICD-10-CM | POA: Diagnosis not present

## 2015-12-05 DIAGNOSIS — F418 Other specified anxiety disorders: Secondary | ICD-10-CM

## 2015-12-05 DIAGNOSIS — F0391 Unspecified dementia with behavioral disturbance: Secondary | ICD-10-CM | POA: Diagnosis not present

## 2015-12-05 NOTE — Progress Notes (Signed)
Location:  [facility] Room Number: 119 A Place of Service:  SNF (31)   CODE STATUS: dnr  Allergies  Allergen Reactions  . Aspirin Other (See Comments)    REACTION: BLEEDING    Chief Complaint  Patient presents with  . Medical Management of Chronic Issues    Routine Visit    HPI:  He is a long term resident of this facility being seen for the management of his chronic illnesses. Overall there is little change in his status.  He is unable to participate in the hpi or ros. There are no nursing concerns at this time.    Past Medical History  Diagnosis Date  . HYPERLIPIDEMIA   . MITRAL REGURGITATION   . Unspecified essential hypertension   . MITRAL VALVE PROLAPSE   . ESOPHAGEAL STRICTURE   . PUD   . OSTEOARTHRITIS   . GASTROINTESTINAL HEMORRHAGE, HX OF   . BENIGN PROSTATIC HYPERTROPHY, HX OF   . Dementia   . HIATAL HERNIA     Past Surgical History  Procedure Laterality Date  . Hemorrhoid surgery    . Circumcision    . Mitral valve repair  05/07/2009    Dr. Roxy Manns  . Hernia repair  03-31-12    BIH repair   . Hernia repair  03/2012    Social History   Social History  . Marital Status: Married    Spouse Name: N/A  . Number of Children: 4  . Years of Education: 12   Occupational History  . Retired    Social History Main Topics  . Smoking status: Never Smoker   . Smokeless tobacco: Never Used  . Alcohol Use: No  . Drug Use: No  . Sexual Activity: Not Currently   Other Topics Concern  . Not on file   Social History Narrative   11th grade. Married 1950 - life sentence. 3 dtrs - '49, '50, '52; 2 sons - '51, '75. He had children by three women. 4 grandchildren. 1 great-grand.   He previously worked for [employer] 25 years, retired from that. Worked as a Development worker, community but totally retired in 2011. End of Life care - wishes full resuscitation and intensive treatment to include mechanical ventilation.      Currently living at  home   Family History  Problem Relation Age of Onset  . Hypertension Mother   . Hypertension Father       VITAL SIGNS BP 141/77 mmHg  Pulse 78  Temp(Src) 98 F (36.7 C) (Oral)  Resp 22  Ht 5\' 9"  (1.753 m)  Wt 173 lb 3.2 oz (78.563 kg)  BMI 25.57 kg/m2  Patient's Medications  New Prescriptions   No medications on file  Previous Medications   ACETAMINOPHEN (PAIN RELIEF) 500 MG TABLET    Take 1 tablet (500 mg total) by mouth 3 (three) times daily. For pain   ALPRAZOLAM (XANAX) 0.25 MG TABLET    TAKE 1 TABLET BY MOUTH TWICE A DAY AS NEEDED FOR ANXIETY   ATORVASTATIN (LIPITOR) 20 MG TABLET    Take 1 tablet (20 mg total) by mouth daily.   CHOLECALCIFEROL (VITAMIN D) 1000 UNITS TABLET    Take 1,000 Units by mouth daily.   CITALOPRAM (CELEXA) 20 MG TABLET    Take 1 tablet (20 mg total) by mouth daily.   DIVALPROEX (DEPAKOTE) 125 MG DR TABLET    Give 6 capsules (750 mg total) by mouth every evening at bedtime for  mood disorder.   FUROSEMIDE (LASIX) 40 MG TABLET    Take 1 tablet (40 mg total) by mouth daily.   GUAIFENESIN (ROBITUSSIN) 100 MG/5ML SYRUP    Take 200 mg by mouth every 6 (six) hours as needed for cough.    LISINOPRIL (PRINIVIL,ZESTRIL) 20 MG TABLET    Take 1 tablet (20 mg total) by mouth daily.   METOPROLOL TARTRATE (LOPRESSOR) 25 MG TABLET    Take 1 tablet (25 mg total) by mouth 2 (two) times daily.   POLYVINYL ALCOHOL-POVIDONE (REFRESH OP)    Place 2 drops into the right eye 4 (four) times daily.   QUETIAPINE (SEROQUEL) 50 MG TABLET    Take 1 tablet (50 mg total) by mouth daily.   TRAZODONE (DESYREL) 50 MG TABLET    Take 50 mg by mouth at bedtime.  Modified Medications   No medications on file  Discontinued Medications   No medications on file     SIGNIFICANT DIAGNOSTIC EXAMS  LABS REVIEWED:   10-28-15: wbc 5.4; hgb 13.3; hct 40.8; mcv 95.8; plt 173; liver normal albumin 4.1    Review of Systems  Unable to perform ROS: dementia    Physical Exam    Constitutional: No distress.  Eyes: Conjunctivae are normal.  Neck: Neck supple. No JVD present. No thyromegaly present.  Cardiovascular: Normal rate, regular rhythm and intact distal pulses.   Murmur heard. 1/6  Respiratory: Effort normal and breath sounds normal. No respiratory distress. He has no wheezes.  GI: Soft. Bowel sounds are normal. He exhibits no distension. There is no tenderness.  Musculoskeletal: He exhibits no edema.  Able to move all extremities   Lymphadenopathy:    He has no cervical adenopathy.  Neurological: He is alert.  Skin: Skin is warm and dry. He is not diaphoretic.  Psychiatric: He has a normal mood and affect.       ASSESSMENT/ PLAN:  1. Dyslipidemia: will continue lipitor 20 mg daily   2. Lower extremity edema: will continue lasix 40 mg daily  3. Hypertension: will continue lopressor 25 mg twice daily and lisinopril 20 mg daily   4. Anxiety with depression: will continue celexa 20 mg daily and has xanax 0.25 mg twice daily as needed; takes depakote sprinkles 750 mg nightly to help stabilize mood take trazodone 25 mg nightly for sleep   5. Psychosis: will continue seroquel 50 mg daily   6. Dementia: no significant change in status; weight is 173 pounds; is presently not on medications; will monitor      Chad Edwards NP [facility]  Contact [phone] Monday through Friday 8am- 5pm  After hours call [phone]

## 2016-01-06 ENCOUNTER — Encounter: Payer: Self-pay | Admitting: Internal Medicine

## 2016-01-06 ENCOUNTER — Non-Acute Institutional Stay (SKILLED_NURSING_FACILITY): Payer: Medicare Other | Admitting: Internal Medicine

## 2016-01-06 DIAGNOSIS — Z952 Presence of prosthetic heart valve: Secondary | ICD-10-CM

## 2016-01-06 DIAGNOSIS — E785 Hyperlipidemia, unspecified: Secondary | ICD-10-CM

## 2016-01-06 DIAGNOSIS — Z954 Presence of other heart-valve replacement: Secondary | ICD-10-CM | POA: Diagnosis not present

## 2016-01-06 DIAGNOSIS — I10 Essential (primary) hypertension: Secondary | ICD-10-CM | POA: Diagnosis not present

## 2016-01-06 DIAGNOSIS — M150 Primary generalized (osteo)arthritis: Secondary | ICD-10-CM

## 2016-01-06 DIAGNOSIS — R600 Localized edema: Secondary | ICD-10-CM

## 2016-01-06 DIAGNOSIS — F418 Other specified anxiety disorders: Secondary | ICD-10-CM | POA: Diagnosis not present

## 2016-01-06 DIAGNOSIS — F0391 Unspecified dementia with behavioral disturbance: Secondary | ICD-10-CM

## 2016-01-06 DIAGNOSIS — F29 Unspecified psychosis not due to a substance or known physiological condition: Secondary | ICD-10-CM

## 2016-01-06 DIAGNOSIS — M159 Polyosteoarthritis, unspecified: Secondary | ICD-10-CM

## 2016-01-06 NOTE — Progress Notes (Signed)
DATE: 01/06/16  Location:    [place] Room Number: [id number] Place of Service: SNF (31)   Extended Emergency Contact Information Primary Emergency Contact: Forry,Louanna Address: [address]          [place], [place] [address] [place] Phone: [phone] Relation: Spouse Secondary Emergency Contact: Obst,[name]  [place] Phone: [phone] Relation: Daughter  Advanced Directive information Does patient have an advance directive?: Yes, Type of Advance Directive: Out of facility DNR (pink MOST or yellow form);Living will;Healthcare Power of Attorney, Does patient want to make changes to advanced directive?: No - Patient declined  Chief Complaint  Patient presents with  . Medical Management of Chronic Issues    Routine Visit    HPI:  82 yo male long term resident seen today for f/u. He is nonverbal and a poor historian. Hx obtained from chart and nursing. No nursing issues. No falls. Appetite and sleep ok. Weight is stable.  hyperlipidemia - stable on lipitor 20 mg daily. Most recent LDL 60  Lower extremity edema - stable on lasix 40 mg daily  Hypertension - BP stable on lopressor 25 mg twice daily and lisinopril 20 mg daily   Anxiety with depression/insomnia - mood stable on celexa 20 mg daily; xanax 0.25 mg twice daily as needed; depakote sprinkles 750 mg nightly to help stabilize mood; trazodone 25 mg nightly for sleep   Psychosis - stable on seroquel 50 mg daily. He shows benefit from medication and it will need to be long term  Dementia -  no significant change in status; weight is 174 lbs. He does not take any medications for cognition   Hx MR - s/p MVR   Past Medical History  Diagnosis Date  . HYPERLIPIDEMIA   . MITRAL REGURGITATION   . Unspecified essential hypertension   . MITRAL VALVE PROLAPSE   . ESOPHAGEAL STRICTURE   . PUD   . OSTEOARTHRITIS   . GASTROINTESTINAL HEMORRHAGE, HX OF   . BENIGN  PROSTATIC HYPERTROPHY, HX OF   . Dementia   . HIATAL HERNIA     Past Surgical History  Procedure Laterality Date  . Hemorrhoid surgery    . Circumcision    . Mitral valve repair  05/07/2009    Dr. Roxy Manns  . Hernia repair  03-31-12    BIH repair   . Hernia repair  03/2012    Patient Care Team: [place], FNP as PCP - General (Family Medicine)  Social History   Social History  . Marital Status: Married    Spouse Name: N/A  . Number of Children: 4  . Years of Education: 12   Occupational History  . Retired    Social History Main Topics  . Smoking status: Never Smoker   . Smokeless tobacco: Never Used  . Alcohol Use: No  . Drug Use: No  . Sexual Activity: Not Currently   Other Topics Concern  . Not on file   Social History Narrative   11th grade. Married 1950 - life sentence. 3 dtrs - '49, '50, '52; 2 sons - '51, '75. He had children by three women. 4 grandchildren. 1 great-grand.   He previously worked for [employer] 25 years, retired from that. Worked as a Development worker, community but totally retired in 2011. End of Life care - wishes full resuscitation and intensive treatment to include mechanical ventilation.      Currently living at home     reports that  he has never smoked. He has never used smokeless tobacco. He reports that he does not drink alcohol or use illicit drugs.  Family History  Problem Relation Age of Onset  . Hypertension Mother   . Hypertension Father    Family Status  Relation Status Death Age  . Mother Deceased 73  . Father Deceased 21  . Maternal Grandmother Deceased   . Maternal Grandfather Deceased   . Paternal Grandmother Deceased   . Paternal Grandfather Deceased     Immunization History  Administered Date(s) Administered  . Influenza Split 04/21/2012  . Influenza-Unspecified 05/20/2013  . PPD Test 08/22/2012, 09/06/2015  . Tdap 07/29/2012    Allergies  Allergen Reactions  . Aspirin Other (See Comments)    REACTION:  BLEEDING    Medications: Patient's Medications  New Prescriptions   No medications on file  Previous Medications   ACETAMINOPHEN (PAIN RELIEF) 500 MG TABLET    Take 1 tablet (500 mg total) by mouth 3 (three) times daily. For pain   ALPRAZOLAM (XANAX) 0.25 MG TABLET    TAKE 1 TABLET BY MOUTH TWICE A DAY AS NEEDED FOR ANXIETY   ATORVASTATIN (LIPITOR) 20 MG TABLET    Take 1 tablet (20 mg total) by mouth daily.   CHOLECALCIFEROL (VITAMIN D) 1000 UNITS TABLET    Take 1,000 Units by mouth daily.   CITALOPRAM (CELEXA) 20 MG TABLET    Take 1 tablet (20 mg total) by mouth daily.   DIVALPROEX (DEPAKOTE) 125 MG DR TABLET    Give 6 capsules (750 mg total) by mouth every evening at bedtime for mood disorder.   FUROSEMIDE (LASIX) 40 MG TABLET    Take 1 tablet (40 mg total) by mouth daily.   GUAIFENESIN (ROBITUSSIN) 100 MG/5ML SYRUP    Take 200 mg by mouth every 6 (six) hours as needed for cough.    LISINOPRIL (PRINIVIL,ZESTRIL) 20 MG TABLET    Take 1 tablet (20 mg total) by mouth daily.   METOPROLOL TARTRATE (LOPRESSOR) 25 MG TABLET    Take 1 tablet (25 mg total) by mouth 2 (two) times daily.   POLYVINYL ALCOHOL-POVIDONE (REFRESH OP)    Place 2 drops into the right eye 4 (four) times daily.   QUETIAPINE (SEROQUEL) 50 MG TABLET    Take 1 tablet (50 mg total) by mouth daily.   TRAZODONE (DESYREL) 50 MG TABLET    Take 50 mg by mouth at bedtime.  Modified Medications   No medications on file  Discontinued Medications   No medications on file    Review of Systems  Unable to perform ROS: Dementia    Filed Vitals:   01/06/16 1223  BP: 141/77  Pulse: 78  Temp: 98 F (36.7 C)  TempSrc: Oral  Resp: 20  Height: 5\' 9"  (1.753 m)  Weight: 174 lb 9.6 oz (79.198 kg)   Body mass index is 25.77 kg/(m^2).  Physical Exam  Constitutional: He appears well-developed.  Lying in bed in NAD, frail appearing, asleep but easily awakened.   HENT:  Mouth/Throat: Oropharynx is clear and moist.  MMM; no tongue  lesions  Eyes:  He refused to open his eyes  Neck: Neck supple. Carotid bruit is not present.  Cardiovascular: Normal rate, regular rhythm and intact distal pulses.  Exam reveals no gallop and no friction rub.   Murmur heard.  Systolic murmur is present with a grade of 1/6  no distal LE swelling. No calf TTP  Pulmonary/Chest: Effort normal and breath sounds normal.  He has no wheezes. He has no rales. He exhibits no tenderness.  Abdominal: Soft. Bowel sounds are normal. He exhibits no distension, no abdominal bruit, no pulsatile midline mass and no mass. There is no tenderness. There is no rebound and no guarding.  Musculoskeletal: He exhibits edema (small and large joint; b/l knee swelling noted).  Lymphadenopathy:    He has no cervical adenopathy.  Neurological: He is alert.  Skin: Skin is warm and dry. No rash noted.  Psychiatric: He has a normal mood and affect. His behavior is normal.  He is nonverbal     Labs reviewed: Nursing Home on 11/11/2015  Component Date Value Ref Range Status  . Hemoglobin 10/29/2015 13.3* 13.5 - 17.5 g/dL Final  . HCT 10/29/2015 41  41 - 53 % Final  . Platelets 10/29/2015 173  150 - 399 K/L Final  . WBC 10/29/2015 5.4   Final  . Alkaline Phosphatase 10/29/2015 51  25 - 125 U/L Final  . ALT 10/29/2015 12  10 - 40 U/L Final  . AST 10/29/2015 15  14 - 40 U/L Final  . Bilirubin, Direct 10/29/2015 0.3  0.01 - 0.4 mg/dL Final  . Bilirubin, Total 10/29/2015 1.4   Final    No results found.   Assessment/Plan   ICD-9-CM ICD-10-CM   1. Dementia, with behavioral disturbance 294.21 F03.91   2. Primary osteoarthritis involving multiple joints 715.09 M15.0   3. Bilateral lower extremity edema - STABLE 782.3 R60.0   4. Essential hypertension 401.9 I10   5. 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 Anxiety associated with depression 300.4 F41.8   8. S/P mitral valve replacement due to MR V43.3 Z95.4      Check depakote  level  Cont current meds as ordered  PT/OT/ST as indicated  Aspiration/fall precautions  Will follow   Oleda Borski S. Perlie Gold  [facility] [address] [place], [place] [address] [phone] Cell (Monday-Friday 8 AM - 5 PM) [phone] After 5 PM and follow prompts

## 2016-01-15 ENCOUNTER — Encounter: Payer: Self-pay | Admitting: Nurse Practitioner

## 2016-01-15 ENCOUNTER — Emergency Department (HOSPITAL_COMMUNITY): Payer: Medicare Other

## 2016-01-15 ENCOUNTER — Inpatient Hospital Stay (HOSPITAL_COMMUNITY)
Admission: EM | Admit: 2016-01-15 | Discharge: 2016-01-16 | DRG: 064 | Disposition: A | Discharge status: Skilled Nursing Facility | Payer: Medicare Other | Attending: Neurology | Admitting: Neurology

## 2016-01-15 ENCOUNTER — Non-Acute Institutional Stay (SKILLED_NURSING_FACILITY): Payer: Medicare Other | Admitting: Nurse Practitioner

## 2016-01-15 ENCOUNTER — Encounter (HOSPITAL_COMMUNITY): Payer: Self-pay

## 2016-01-15 DIAGNOSIS — Z515 Encounter for palliative care: Secondary | ICD-10-CM | POA: Diagnosis not present

## 2016-01-15 DIAGNOSIS — I63311 Cerebral infarction due to thrombosis of right middle cerebral artery: Secondary | ICD-10-CM

## 2016-01-15 DIAGNOSIS — Z886 Allergy status to analgesic agent status: Secondary | ICD-10-CM | POA: Diagnosis not present

## 2016-01-15 DIAGNOSIS — I63411 Cerebral infarction due to embolism of right middle cerebral artery: Secondary | ICD-10-CM | POA: Diagnosis present

## 2016-01-15 DIAGNOSIS — I10 Essential (primary) hypertension: Secondary | ICD-10-CM | POA: Diagnosis present

## 2016-01-15 DIAGNOSIS — E785 Hyperlipidemia, unspecified: Secondary | ICD-10-CM | POA: Diagnosis present

## 2016-01-15 DIAGNOSIS — Z8249 Family history of ischemic heart disease and other diseases of the circulatory system: Secondary | ICD-10-CM | POA: Diagnosis not present

## 2016-01-15 DIAGNOSIS — I619 Nontraumatic intracerebral hemorrhage, unspecified: Secondary | ICD-10-CM | POA: Diagnosis not present

## 2016-01-15 DIAGNOSIS — G8114 Spastic hemiplegia affecting left nondominant side: Secondary | ICD-10-CM | POA: Diagnosis present

## 2016-01-15 DIAGNOSIS — R4182 Altered mental status, unspecified: Secondary | ICD-10-CM

## 2016-01-15 DIAGNOSIS — I341 Nonrheumatic mitral (valve) prolapse: Secondary | ICD-10-CM | POA: Diagnosis present

## 2016-01-15 DIAGNOSIS — R233 Spontaneous ecchymoses: Secondary | ICD-10-CM | POA: Diagnosis present

## 2016-01-15 DIAGNOSIS — K222 Esophageal obstruction: Secondary | ICD-10-CM | POA: Diagnosis present

## 2016-01-15 DIAGNOSIS — Z79899 Other long term (current) drug therapy: Secondary | ICD-10-CM | POA: Diagnosis not present

## 2016-01-15 DIAGNOSIS — I340 Nonrheumatic mitral (valve) insufficiency: Secondary | ICD-10-CM | POA: Diagnosis present

## 2016-01-15 DIAGNOSIS — I63131 Cerebral infarction due to embolism of right carotid artery: Secondary | ICD-10-CM | POA: Diagnosis not present

## 2016-01-15 DIAGNOSIS — I6340 Cerebral infarction due to embolism of unspecified cerebral artery: Secondary | ICD-10-CM | POA: Diagnosis present

## 2016-01-15 DIAGNOSIS — Z66 Do not resuscitate: Secondary | ICD-10-CM | POA: Diagnosis present

## 2016-01-15 DIAGNOSIS — F0390 Unspecified dementia without behavioral disturbance: Secondary | ICD-10-CM | POA: Diagnosis present

## 2016-01-15 LAB — CBC WITH DIFFERENTIAL/PLATELET
Basophils Absolute: 0 10*3/uL (ref 0.0–0.1)
Basophils Relative: 0 %
EOS ABS: 0 10*3/uL (ref 0.0–0.7)
Eosinophils Relative: 0 %
HCT: 40.8 % (ref 39.0–52.0)
HEMOGLOBIN: 12.6 g/dL — AB (ref 13.0–17.0)
LYMPHS ABS: 1.3 10*3/uL (ref 0.7–4.0)
LYMPHS PCT: 14 %
MCH: 31 pg (ref 26.0–34.0)
MCHC: 30.9 g/dL (ref 30.0–36.0)
MCV: 100.5 fL — AB (ref 78.0–100.0)
Monocytes Absolute: 0.8 10*3/uL (ref 0.1–1.0)
Monocytes Relative: 9 %
NEUTROS PCT: 77 %
Neutro Abs: 7.3 10*3/uL (ref 1.7–7.7)
Platelets: 234 10*3/uL (ref 150–400)
RBC: 4.06 MIL/uL — AB (ref 4.22–5.81)
RDW: 13.1 % (ref 11.5–15.5)
WBC: 9.5 10*3/uL (ref 4.0–10.5)

## 2016-01-15 LAB — COMPREHENSIVE METABOLIC PANEL
ALT: 21 U/L (ref 17–63)
AST: 43 U/L — ABNORMAL HIGH (ref 15–41)
Albumin: 3.5 g/dL (ref 3.5–5.0)
Alkaline Phosphatase: 57 U/L (ref 38–126)
Anion gap: 13 (ref 5–15)
BUN: 29 mg/dL — ABNORMAL HIGH (ref 6–20)
CHLORIDE: 113 mmol/L — AB (ref 101–111)
CO2: 23 mmol/L (ref 22–32)
Calcium: 9.6 mg/dL (ref 8.9–10.3)
Creatinine, Ser: 1.66 mg/dL — ABNORMAL HIGH (ref 0.61–1.24)
GFR, EST AFRICAN AMERICAN: 41 mL/min — AB (ref >60)
GFR, EST NON AFRICAN AMERICAN: 36 mL/min — AB (ref >60)
Glucose, Bld: 91 mg/dL (ref 65–99)
POTASSIUM: 3.8 mmol/L (ref 3.5–5.1)
Sodium: 149 mmol/L — ABNORMAL HIGH (ref 135–145)
Total Bilirubin: 1.8 mg/dL — ABNORMAL HIGH (ref 0.3–1.2)
Total Protein: 7 g/dL (ref 6.5–8.1)

## 2016-01-15 LAB — MRSA PCR SCREENING: MRSA BY PCR: NEGATIVE

## 2016-01-15 LAB — I-STAT CG4 LACTIC ACID, ED: LACTIC ACID, VENOUS: 2.57 mmol/L — AB (ref 0.5–1.9)

## 2016-01-15 LAB — I-STAT TROPONIN, ED: TROPONIN I, POC: 0.03 ng/mL (ref 0.00–0.08)

## 2016-01-15 LAB — VALPROIC ACID LEVEL: Valproic Acid Lvl: <10 ug/mL — ABNORMAL LOW (ref 50.0–100.0)

## 2016-01-15 MED ORDER — STROKE: EARLY STAGES OF RECOVERY BOOK
Freq: Once | Status: AC
  Administered 2016-01-15: 19:00:00
  Filled 2016-01-15: qty 1

## 2016-01-15 MED ORDER — LABETALOL HCL 5 MG/ML IV SOLN
20.0000 mg | Freq: Once | INTRAVENOUS | Status: AC
  Administered 2016-01-15: 20 mg via INTRAVENOUS
  Filled 2016-01-15: qty 4

## 2016-01-15 MED ORDER — SENNOSIDES-DOCUSATE SODIUM 8.6-50 MG PO TABS: 1.0000 | ORAL_TABLET | Freq: Two times a day (BID) | ORAL | Status: DC

## 2016-01-15 MED ORDER — FAMOTIDINE IN NACL 20-0.9 MG/50ML-% IV SOLN
20.0000 mg | Freq: Two times a day (BID) | INTRAVENOUS | Status: DC
  Administered 2016-01-15 – 2016-01-16 (×2): 20 mg via INTRAVENOUS
  Filled 2016-01-15 (×2): qty 50

## 2016-01-15 MED ORDER — SODIUM CHLORIDE 0.9 % IV BOLUS (SEPSIS)
1000.0000 mL | Freq: Once | INTRAVENOUS | Status: AC
  Administered 2016-01-15: 1000 mL via INTRAVENOUS

## 2016-01-15 MED ORDER — NICARDIPINE HCL IN NACL 20-0.86 MG/200ML-% IV SOLN
0.0000 mg/h | INTRAVENOUS | Status: DC
  Administered 2016-01-15: 5 mg/h via INTRAVENOUS
  Filled 2016-01-15: qty 200

## 2016-01-15 MED ORDER — SODIUM CHLORIDE 0.9 % IV SOLN
INTRAVENOUS | Status: DC
  Administered 2016-01-15: 21:00:00 via INTRAVENOUS

## 2016-01-15 MED ORDER — ACETAMINOPHEN 650 MG RE SUPP
650.0000 mg | RECTAL | Status: DC | PRN
Start: 2016-01-15 — End: 2016-01-16
  Administered 2016-01-16: 650 mg via RECTAL
  Filled 2016-01-15: qty 1

## 2016-01-15 MED ORDER — ACETAMINOPHEN 325 MG PO TABS: 650.0000 mg | ORAL_TABLET | ORAL | Status: DC | PRN

## 2016-01-15 NOTE — H&P (Signed)
NEURO H&P         History obtained from:  [facility]  HPI:                                                                                                                                          Chad Delacruz is an  82 yo male with advanced dementia and poor baseline presents with 3 day hx of altered mental status from his assisted living facility after he became even less verbal and not swallowing. CTH found to have a large R MCA infract with hemorraghic transformation, no midline shift.   Past Medical History  Diagnosis Date  . HYPERLIPIDEMIA   . MITRAL REGURGITATION   . Unspecified essential hypertension   . MITRAL VALVE PROLAPSE   . ESOPHAGEAL STRICTURE   . PUD   . OSTEOARTHRITIS   . GASTROINTESTINAL HEMORRHAGE, HX OF   . BENIGN PROSTATIC HYPERTROPHY, HX OF   . Dementia   . HIATAL HERNIA     Past Surgical History  Procedure Laterality Date  . Hemorrhoid surgery    . Circumcision    . Mitral valve repair  05/07/2009    Dr. Roxy Manns  . Hernia repair  03-31-12    BIH repair   . Hernia repair  03/2012    Family History  Problem Relation Age of Onset  . Hypertension Mother   . Hypertension Father       Social History:  reports that he has never smoked. He has never used smokeless tobacco. He reports that he does not drink alcohol or use illicit drugs.  Allergies  Allergen Reactions  . Aspirin Other (See Comments)    REACTION: BLEEDING    MEDICATIONS:                                                                                                                     I have reviewed the patient's current medications.   ROS:  History obtained from chart review  General ROS: negative for - chills, fatigue, fever, night sweats, weight gain or weight loss Psychological ROS: negative for - behavioral disorder,  hallucinations, memory difficulties, mood swings or suicidal ideation Ophthalmic ROS: negative for - blurry vision, double vision, eye pain or loss of vision ENT ROS: negative for - epistaxis, nasal discharge, oral lesions, sore throat, tinnitus or vertigo Allergy and Immunology ROS: negative for - hives or itchy/watery eyes Hematological and Lymphatic ROS: negative for - bleeding problems, bruising or swollen lymph nodes Endocrine ROS: negative for - galactorrhea, hair pattern changes, polydipsia/polyuria or temperature intolerance Respiratory ROS: negative for - cough, hemoptysis, shortness of breath or wheezing Cardiovascular ROS: negative for - chest pain, dyspnea on exertion, edema or irregular heartbeat Gastrointestinal ROS: negative for - abdominal pain, diarrhea, hematemesis, nausea/vomiting or stool incontinence Genito-Urinary ROS: negative for - dysuria, hematuria, incontinence or urinary frequency/urgency Musculoskeletal ROS: negative for - joint swelling or muscular weakness Neurological ROS: as noted in HPI Dermatological ROS: negative for rash and skin lesion changes   Blood pressure 128/74, pulse 88, temperature 98.4 F (36.9 C), temperature source Oral, resp. rate 18, weight 79.379 kg (175 lb), SpO2 100 %.   Neurologic Examination:                                                                                                      HEENT-  Normocephalic, no lesions, without obvious abnormality.  Normal external eye and conjunctiva.  Normal TM's bilaterally.  Normal auditory canals and external ears. Normal external nose, mucus membranes and septum.  Normal pharynx. Cardiovascular- regular rate and rhythm, S1, S2 normal, no murmur, click, rub or gallop, pulses palpable throughout     Neurological Examination Mental Status: Awake, non verbal Does not follow commands Grimaces to pain Pupils are equal and reactive L hemiparesis, spastic R side is 4/5, withdraws to pain       Lab Results: Basic Metabolic Panel: No results for input(s): NA, K, CL, CO2, GLUCOSE, BUN, CREATININE, CALCIUM, MG, PHOS in the last 168 hours.  Liver Function Tests: No results for input(s): AST, ALT, ALKPHOS, BILITOT, PROT, ALBUMIN in the last 168 hours. No results for input(s): LIPASE, AMYLASE in the last 168 hours. No results for input(s): AMMONIA in the last 168 hours.  CBC:  Recent Labs Lab 01/15/16 1600  WBC 9.5  NEUTROABS 7.3  HGB 12.6*  HCT 40.8  MCV 100.5*  PLT 234    Cardiac Enzymes: No results for input(s): CKTOTAL, CKMB, CKMBINDEX, TROPONINI in the last 168 hours.  Lipid Panel: No results for input(s): CHOL, TRIG, HDL, CHOLHDL, VLDL, LDLCALC in the last 168 hours.  CBG: No results for input(s): GLUCAP in the last 168 hours.  Microbiology: Results for orders placed or performed during the hospital encounter of 05/07/09  MRSA PCR Screening     Status: None   Collection Time: 05/07/09  3:36 PM  Result Value Ref Range Status   MRSA by PCR  NEGATIVE Final    NEGATIVE        The GeneXpert MRSA  Assay (FDA approved for NASAL specimens only), is one component of a comprehensive MRSA colonization surveillance program. It is not intended to diagnose MRSA infection nor to guide or monitor treatment for MRSA infections.    Coagulation Studies: No results for input(s): LABPROT, INR in the last 72 hours.  Imaging: Ct Head Wo Contrast  01/15/2016  CLINICAL DATA:  Altered mental status. History of mitral valve surgery. EXAM: CT HEAD WITHOUT CONTRAST TECHNIQUE: Contiguous axial images were obtained from the base of the skull through the vertex without intravenous contrast. COMPARISON:  Head CT 06/14/2015. FINDINGS: Brain: There is multifocal acute gyriform hemorrhage throughout the right frontal, temporal and parietal lobes. There is a component in the right basal ganglia measuring 2.6 x 1.1 cm on image 18. There is surrounding low density extending to the  cortex. There is localized mass effect on the right lateral ventricle, but no midline shift or hydrocephalus. Chronic encephalomalacia in the left MCA distribution is stable. There are extensive vascular calcifications without evidence of dense MCA sign. Bones/sinuses/visualized face: The visualized paranasal sinuses, mastoid air cells and middle ears are clear. The calvarium is intact. IMPRESSION: 1. 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 No midline shift or hydrocephalus. 2. Underlying atrophy and chronic encephalomalacia in the left MCA distribution. 3. Critical Value/emergent results were called by telephone at the time of interpretation on 01/15/2016 at 3:40 pm to [facility] , who verbally acknowledged these results. Electronically Signed   By: Richardean Sale M.D.   On: 01/15/2016 15:40   Dg Chest Port 1 View  01/15/2016  CLINICAL DATA:  Acute mental status change EXAM: PORTABLE CHEST 1 VIEW COMPARISON:  June 03, 2009 FINDINGS: Stable cardiomegaly. Linear opacity in the right mid lung, improved since 2010, likely scarring or chronic atelectasis. Evaluation of the apices is limited due to the head slumped over the upper chest but no pneumothorax is identified. No pulmonary nodules or masses. No focal infiltrates otherwise seen. The hila and mediastinum are normal. A rounded density is projected over the proximal right humerus, not seen previously. IMPRESSION: 1. No acute abnormality seen within the chest. Chronic scar or atelectasis in the right mid lung. 2. The rounded density projected over the proximal right humerus may be artifactual. Dedicated images of the right shoulder could further evaluate as clinically warranted. Electronically Signed   By: Dorise Bullion III M.D   On: 01/15/2016 16:16      Assessment/Plan:  82 yo male with advanced dementia and poor baseline presents with 3 day hx of altered mental status from his assisted living facility  after he became even less verbal and not swallowing. CTH found to have a large R MCA infract with hemorraghic transformation, no midline shift.  Goals of care discussed with the family. They understand that his outcome is very poor and would like to speak with pallaitive care in the morning  - Admit to ICU - SBP < 140 - SCD's - Hold any anticoagulation  - Hold sub Q heparin - DNR as per family - Aspiration precautions - Pallative care consult

## 2016-01-15 NOTE — Progress Notes (Signed)
eLink Physician-Brief Progress Note Patient Name: Chad Delacruz DOB: Sep 18, 1926 MRN: [id number]   Date of Service  01/15/2016  HPI/Events of Note  Ct head noted horrible prognosis Neuro note reviewed, dnr in his note but no order rn to call for dnr order On nicardipine, which would be medically ineffective and all aggressive care is futile escalation and icu admission of concern as will not benefit   eICU Interventions  wil make sure dnr is written as per family d/w neuro     Intervention Category Evaluation Type: New Patient Evaluation  Raylene Miyamoto. 01/15/2016, 7:09 PM

## 2016-01-15 NOTE — ED Provider Notes (Signed)
CSN: [id number]     Arrival date & time 01/15/16  1447 History   None    Chief Complaint  Patient presents with  . Weakness  . Altered Mental Status   HPI Comments: 82 year old male who presents with AMS. Patient is from [facility]. PMH significant for advanced dementia, MR, HTN, HLD, arthritis. He is on several psychiatric meds (Depakote, Seroquel, Trazodone, Xanax, Celexa). History is obtained by family in the room. They state that the nursing staff expressed concerns since he has been sleeping almost continuously for the past 3 days. He has woken up at times for meals however gone right back to sleep and has had difficulty swallowing. On review of EMR notes they also reports an episode of hypoxia to 80% with tachycardia that spontaneously resolved. They are unsure if he has had any fevers. He is not on blood thinners and state that the nursing home did not report a fall. They state he has had increased right sided weakness and right arms and hand tremor which is new. He normally does not talk and is wheelchair bound.  Patient is a 82 y.o. male presenting with weakness and altered mental status.  Weakness Associated symptoms include weakness.  Altered Mental Status Associated symptoms: weakness     Past Medical History  Diagnosis Date  . HYPERLIPIDEMIA   . MITRAL REGURGITATION   . Unspecified essential hypertension   . MITRAL VALVE PROLAPSE   . ESOPHAGEAL STRICTURE   . PUD   . OSTEOARTHRITIS   . GASTROINTESTINAL HEMORRHAGE, HX OF   . BENIGN PROSTATIC HYPERTROPHY, HX OF   . Dementia   . HIATAL HERNIA    Past Surgical History  Procedure Laterality Date  . Hemorrhoid surgery    . Circumcision    . Mitral valve repair  05/07/2009    Dr. Roxy Manns  . Hernia repair  03-31-12    BIH repair   . Hernia repair  03/2012   Family History  Problem Relation Age of Onset  . Hypertension Mother   . Hypertension Father    Social History  Substance Use Topics  . Smoking status:  Never Smoker   . Smokeless tobacco: Never Used  . Alcohol Use: No    Review of Systems  Unable to perform ROS: Patient unresponsive    Allergies  Aspirin  Home Medications   Prior to Admission medications   Medication Sig Start Date End Date Taking? Authorizing Provider  acetaminophen (PAIN RELIEF) 500 MG tablet Take 1 tablet (500 mg total) by mouth 3 (three) times daily. For pain 07/29/15   Golden Circle, FNP  ALPRAZolam Duanne Moron) 0.25 MG tablet TAKE 1 TABLET BY MOUTH TWICE A DAY AS NEEDED FOR ANXIETY 09/03/15   Golden Circle, FNP  atorvastatin (LIPITOR) 20 MG tablet Take 1 tablet (20 mg total) by mouth daily. 07/29/15   Golden Circle, FNP  cholecalciferol (VITAMIN D) 1000 UNITS tablet Take 1,000 Units by mouth daily.    Historical Provider, MD  citalopram (CELEXA) 20 MG tablet Take 1 tablet (20 mg total) by mouth daily. 07/29/15   Golden Circle, FNP  divalproex (DEPAKOTE) 125 MG DR tablet Give 6 capsules (750 mg total) by mouth every evening at bedtime for mood disorder.    Historical Provider, MD  furosemide (LASIX) 40 MG tablet Take 1 tablet (40 mg total) by mouth daily. 07/29/15   Golden Circle, FNP  guaifenesin (ROBITUSSIN) 100 MG/5ML syrup Take 200 mg by mouth every  6 (six) hours as needed for cough.     Historical Provider, MD  lisinopril (PRINIVIL,ZESTRIL) 20 MG tablet Take 1 tablet (20 mg total) by mouth daily. 07/29/15   Golden Circle, FNP  metoprolol tartrate (LOPRESSOR) 25 MG tablet Take 1 tablet (25 mg total) by mouth 2 (two) times daily. 07/29/15   Golden Circle, FNP  Polyvinyl Alcohol-Povidone (REFRESH OP) Place 2 drops into the right eye 4 (four) times daily.    Historical Provider, MD  QUEtiapine (SEROQUEL) 50 MG tablet Take 1 tablet (50 mg total) by mouth daily. 07/29/15   Golden Circle, FNP  traZODone (DESYREL) 50 MG tablet Take 50 mg by mouth at bedtime.    Historical Provider, MD   BP 128/74 mmHg  Pulse 88  Temp(Src) 98.4 F (36.9 C) (Oral)  Resp 18  Wt  79.379 kg  SpO2 100%   Physical Exam  Constitutional: No distress.  Elderly male - contracted but NAD  HENT:  Head: Normocephalic and atraumatic.  Eyes: Conjunctivae are normal. Pupils are equal, round, and reactive to light. Right eye exhibits discharge. Left eye exhibits no discharge. No scleral icterus.  Blepharitis of right eye  Neck: Normal range of motion. Neck supple. No JVD present.  Cardiovascular: Normal rate and regular rhythm.  Exam reveals no gallop and no friction rub.   Pulmonary/Chest: Effort normal and breath sounds normal. No stridor. No respiratory distress. He has no wheezes. He has no rales. He exhibits no tenderness.  Abdominal: Soft. Bowel sounds are normal. He exhibits no distension and no mass. There is no tenderness. There is no rebound and no guarding.  Musculoskeletal:  No lower led edema  Lymphadenopathy:    He has no cervical adenopathy.  Neurological: He is disoriented and unresponsive. He displays atrophy and tremor. GCS eye subscore is 1. GCS verbal subscore is 1. GCS motor subscore is 4.  Right sided facial droop, contracted right arm and hand with right hand tremor  Skin: Skin is warm and dry.  Psychiatric: He is noncommunicative.    ED Course  Procedures (including critical care time) Labs Review Labs Reviewed  CBC WITH DIFFERENTIAL/PLATELET - Abnormal; Notable for the following:    RBC 4.06 (*)    Hemoglobin 12.6 (*)    MCV 100.5 (*)    All other components within normal limits  I-STAT CG4 LACTIC ACID, ED - Abnormal; Notable for the following:    Lactic Acid, Venous 2.57 (*)    All other components within normal limits  COMPREHENSIVE METABOLIC PANEL  VALPROIC ACID LEVEL  I-STAT TROPOININ, ED    Imaging Review Ct Head Wo Contrast  01/15/2016  CLINICAL DATA:  Altered mental status. History of mitral valve surgery. EXAM: CT HEAD WITHOUT CONTRAST TECHNIQUE: Contiguous axial images were obtained from the base of the skull through the vertex  without intravenous contrast. COMPARISON:  Head CT 06/14/2015. FINDINGS: Brain: There is multifocal acute gyriform hemorrhage throughout the right frontal, temporal and parietal lobes. There is a component in the right basal ganglia measuring 2.6 x 1.1 cm on image 18. There is surrounding low density extending to the cortex. There is localized mass effect on the right lateral ventricle, but no midline shift or hydrocephalus. Chronic encephalomalacia in the left MCA distribution is stable. There are extensive vascular calcifications without evidence of dense MCA sign. Bones/sinuses/visualized face: The visualized paranasal sinuses, mastoid air cells and middle ears are clear. The calvarium is intact. IMPRESSION: 1. Acute large hemorrhagic right MCA distribution  infarct with localized mass effect. No midline shift or hydrocephalus. 2. Underlying atrophy and chronic encephalomalacia in the left MCA distribution. 3. Critical Value/emergent results were called by telephone at the time of interpretation on 01/15/2016 at 3:40 pm to [facility] , who verbally acknowledged these results. Electronically Signed   By: Richardean Sale M.D.   On: 01/15/2016 15:40   I have personally reviewed and evaluated these images and lab results as part of my medical decision-making.   EKG Interpretation None      MDM   Final diagnoses:  Nontraumatic intracerebral hemorrhage, unspecified cerebral location, unspecified laterality [facility])   82 year old male who presents with a hemorrhagic stroke. CT of head shows acute large hemorrhagic right MCA distribution infarct with localized mass effect. No midline shift or hydrocephalus. Underlying atrophy and chronic encephalomalacia in the left MCA distribution. Unsure of code status at this time as there is conflicting information in EMR so this will need to be clarified. Spoke with Norman Clay MD with neuro who will admit patient. Labs still pending.     Recardo Evangelist,  PA-C 01/15/16 [place], PA-C 01/15/16 1727  Blanchie Dessert, MD 01/17/16 [phone]

## 2016-01-15 NOTE — Progress Notes (Signed)
Nursing Home Location:  [facility]   Place of Service: SNF (31)  PCP: Gildardo Cranker, DO  Allergies  Allergen Reactions  . Aspirin Other (See Comments)    REACTION: BLEEDING    Chief Complaint  Patient presents with  . Acute Visit    Nursing concerns due to patient being lethargic.    HPI:  Patient is a 82 y.o. male seen today at [facility] at the request of nursing. Pt with a hx of dementia with behaviors, hyperlipidemia, MR, OA.  Pt has been followed by psych due to behaviors and has been taking Depakote and Seroquel Pt on trazodone at bedtime for sleep Staff reports he will have days that he is more lethargic but then will improve however pt has been progressively more lethargic over the last 3 days and now will not eat or drink. Pt hunched over in [facility] at time of visit.   Nursing reports pt had episode of sats in the 80% and tachycardia on pulse ox that improved when she was sitting with him.  Currently VSS however pt does not respond to painful stimuli   Review of Systems:  Review of Systems  Unable to perform ROS: Patient unresponsive    Past Medical History  Diagnosis Date  . HYPERLIPIDEMIA   . MITRAL REGURGITATION   . Unspecified essential hypertension   . MITRAL VALVE PROLAPSE   . ESOPHAGEAL STRICTURE   . PUD   . OSTEOARTHRITIS   . GASTROINTESTINAL HEMORRHAGE, HX OF   . BENIGN PROSTATIC HYPERTROPHY, HX OF   . Dementia   . HIATAL HERNIA    Past Surgical History  Procedure Laterality Date  . Hemorrhoid surgery    . Circumcision    . Mitral valve repair  05/07/2009    Dr. Roxy Manns  . Hernia repair  03-31-12    BIH repair   . Hernia repair  03/2012   Social History:   reports that he has never smoked. He has never used smokeless tobacco. He reports that he does not drink alcohol or use illicit drugs.  Family History  Problem Relation Age of Onset  . Hypertension Mother   . Hypertension Father     Medications: Patient's Medications    New Prescriptions   No medications on file  Previous Medications   ACETAMINOPHEN (PAIN RELIEF) 500 MG TABLET    Take 1 tablet (500 mg total) by mouth 3 (three) times daily. For pain   ALPRAZOLAM (XANAX) 0.25 MG TABLET    TAKE 1 TABLET BY MOUTH TWICE A DAY AS NEEDED FOR ANXIETY   ATORVASTATIN (LIPITOR) 20 MG TABLET    Take 1 tablet (20 mg total) by mouth daily.   CHOLECALCIFEROL (VITAMIN D) 1000 UNITS TABLET    Take 1,000 Units by mouth daily.   CITALOPRAM (CELEXA) 20 MG TABLET    Take 1 tablet (20 mg total) by mouth daily.   DIVALPROEX (DEPAKOTE) 125 MG DR TABLET    Give 6 capsules (750 mg total) by mouth every evening at bedtime for mood disorder.   FUROSEMIDE (LASIX) 40 MG TABLET    Take 1 tablet (40 mg total) by mouth daily.   GUAIFENESIN (ROBITUSSIN) 100 MG/5ML SYRUP    Take 200 mg by mouth every 6 (six) hours as needed for cough.    LISINOPRIL (PRINIVIL,ZESTRIL) 20 MG TABLET    Take 1 tablet (20 mg total) by mouth daily.   METOPROLOL TARTRATE (LOPRESSOR) 25 MG TABLET    Take 1  tablet (25 mg total) by mouth 2 (two) times daily.   POLYVINYL ALCOHOL-POVIDONE (REFRESH OP)    Place 2 drops into the right eye 4 (four) times daily.   QUETIAPINE (SEROQUEL) 50 MG TABLET    Take 1 tablet (50 mg total) by mouth daily.   TRAZODONE (DESYREL) 50 MG TABLET    Take 50 mg by mouth at bedtime.  Modified Medications   No medications on file  Discontinued Medications   No medications on file     Physical Exam: Filed Vitals:   01/15/16 1337  BP: 148/87  Pulse: 86  Temp: 99 F (37.2 C)  TempSrc: Oral  Resp: 20  Height: 5\' 9"  (1.753 m)  Weight: 174 lb (78.926 kg)    Physical Exam  Constitutional: He appears well-developed.  Frail elderly male  Eyes:  Does not open right eye  Neck: Neck supple. Carotid bruit is not present.  Cardiovascular: Normal rate and intact distal pulses.  An irregular rhythm present.  Murmur heard.  Systolic murmur is present with a grade of 1/6  no distal LE  swelling. No calf TTP  Pulmonary/Chest: Effort normal.  Diminished throughout   Abdominal: Soft. Bowel sounds are normal. He exhibits no distension, no abdominal bruit and no pulsatile midline mass. There is no tenderness.  Neurological: He is unresponsive.  Skin: Skin is warm and dry.  Psychiatric: He has a normal mood and affect. His behavior is normal.    Labs reviewed: Basic Metabolic Panel:  Recent Labs  07/16/15 2323  NA 141  K 3.7  CL 105  CO2 27  GLUCOSE 95  BUN 25*  CREATININE 1.49*  CALCIUM 8.8*   Liver Function Tests:  Recent Labs  07/16/15 2323 10/29/15  AST 15 15  ALT 9* 12  ALKPHOS 68 51  BILITOT 0.5  --   PROT 6.1*  --   ALBUMIN 3.7  --    No results for input(s): LIPASE, AMYLASE in the last 8760 hours. No results for input(s): AMMONIA in the last 8760 hours. CBC:  Recent Labs  07/16/15 2323 10/29/15  WBC 4.0 5.4  NEUTROABS 2.5  --   HGB 12.3* 13.3*  HCT 38.3* 41  MCV 95.5  --   PLT 147* 173   TSH: No results for input(s): TSH in the last 8760 hours. A1C: Lab Results  Component Value Date   HGBA1C  05/03/2009    5.4 (NOTE) The ADA recommends the following therapeutic goal for glycemic control related to Hgb A1c measurement: Goal of therapy: <6.5 Hgb A1c  Reference: American Diabetes Association: Clinical Practice Recommendations 2010, Diabetes Care, 2010, 33: (Suppl  1).   Lipid Panel: No results for input(s): CHOL, HDL, LDLCALC, TRIG, CHOLHDL, LDLDIRECT in the last 8760 hours.   Valproic acid (Depakene) 01/08/16: 49.7 ug/ml    Assessment/Plan 1. Altered mental status, unspecified altered mental status type -pt to be sent to the hospital via EMS for further evaluation of AMS, possible cause due to medication but to rule out CVA/PNE/UTI etc, pt also has not been able to take in fluids or food today due to mental status change.  Carlos American. Harle Battiest  [facility] [phone] 8 am - 5  pm) [phone] (after hours)

## 2016-01-15 NOTE — ED Notes (Signed)
Pt presents to ed from a nursing facility with complaints of altered mental status x 3 days, the patients family came to visit today and said that he needed to be evaluated for not acting like himself, the patient is normally very confused but alert, patient presents to er not responding to staff with obvious right sided facial droop, unable to follow commands, breathing well on his own, vs wnl

## 2016-01-16 ENCOUNTER — Inpatient Hospital Stay (HOSPITAL_COMMUNITY): Payer: Medicare Other

## 2016-01-16 DIAGNOSIS — R233 Spontaneous ecchymoses: Secondary | ICD-10-CM

## 2016-01-16 DIAGNOSIS — I63131 Cerebral infarction due to embolism of right carotid artery: Secondary | ICD-10-CM

## 2016-01-16 LAB — GLUCOSE, CAPILLARY
GLUCOSE-CAPILLARY: 61 mg/dL — AB (ref 65–99)
GLUCOSE-CAPILLARY: 73 mg/dL (ref 65–99)
Glucose-Capillary: 105 mg/dL — ABNORMAL HIGH (ref 65–99)
Glucose-Capillary: 94 mg/dL (ref 65–99)

## 2016-01-16 MED ORDER — ACETAMINOPHEN 650 MG RE SUPP: 650.0000 mg | RECTAL | Status: AC | PRN

## 2016-01-16 MED ORDER — DEXTROSE 50 % IV SOLN
INTRAVENOUS | Status: AC
  Administered 2016-01-16: 50 mL
  Filled 2016-01-16: qty 50

## 2016-01-16 NOTE — Progress Notes (Signed)
Transport picked pt. Up around 1400. Attempted report x 2 to [facility]. NO answer.

## 2016-01-16 NOTE — Progress Notes (Signed)
STROKE TEAM PROGRESS NOTE   HISTORY OF PRESENT ILLNESS (per record) Chad Delacruz is an 82 yo male with advanced dementia and poor baseline presents with 3 day hx of altered mental status from his assisted living facility after he became even less verbal and not swallowing. CTH found to have a large R MCA infract with hemorraghic transformation, no midline shift. Patient was not administered IV t-PA secondary to delay in arrival. He was admitted to the neuro ICU for further evaluation and treatment.   SUBJECTIVE (INTERVAL HISTORY) No family is at the bedside. Patient in the bed, eyes closed, not talking, not following commands. Pt with large stroke but only localized edema. Dr. Leonie Man called and discussed pt's diagnosis and prognosis with wife over the phone. Wife and daughter on the way. Dr. Leonie Man will meet with them on arrival. Pt is a DNR. Likely xfer to floor later today.   OBJECTIVE Temp:  [98.4 F (36.9 C)-101.1 F (38.4 C)] 101.1 F (38.4 C) (06/29 0400) Pulse Rate:  [73-107] 92 (06/29 0700) Cardiac Rhythm:  [-]  Resp:  [8-25] 13 (06/29 0700) BP: (101-150)/(58-101) 109/71 mmHg (06/29 0700) SpO2:  [93 %-100 %] 99 % (06/29 0700) Weight:  [72.6 kg (160 lb 0.9 oz)-79.379 kg (175 lb)] 72.6 kg (160 lb 0.9 oz) (06/28 2000)  CBC:   Recent Labs Lab 01/15/16 1600  WBC 9.5  NEUTROABS 7.3  HGB 12.6*  HCT 40.8  MCV 100.5*  PLT 338    Basic Metabolic Panel:   Recent Labs Lab 01/15/16 1600  NA 149*  K 3.8  CL 113*  CO2 23  GLUCOSE 91  BUN 29*  CREATININE 1.66*  CALCIUM 9.6    Lipid Panel:     Component Value Date/Time   CHOL 126 10/12/2012 1716   TRIG 59.0 10/12/2012 1716   HDL 54.40 10/12/2012 1716   CHOLHDL 2 10/12/2012 1716   VLDL 11.8 10/12/2012 1716   LDLCALC 60 10/12/2012 1716   HgbA1c:  Lab Results  Component Value Date   HGBA1C  05/03/2009    5.4 (NOTE) The ADA recommends the following therapeutic goal for glycemic control related to Hgb A1c  measurement: Goal of therapy: <6.5 Hgb A1c  Reference: American Diabetes Association: Clinical Practice Recommendations 2010, Diabetes Care, 2010, 33: (Suppl  1).   Urine Drug Screen: No results found for: LABOPIA, COCAINSCRNUR, LABBENZ, AMPHETMU, THCU, LABBARB    IMAGING  Ct Head Wo Contrast  01/16/2016  CLINICAL DATA:  Followup intracranial hemorrhage. EXAM: CT HEAD WITHOUT CONTRAST TECHNIQUE: Contiguous axial images were obtained from the base of the skull through the vertex without intravenous contrast. COMPARISON:  Prior CT from 01/15/2016. FINDINGS: Study is limited by patient positioning. Acute right MCA territory infarct with associated hemorrhage again seen. Overall, degree of hemorrhage is slightly less conspicuous as compared to prior exam. Involvement of the right basal ganglia again noted. No new hemorrhage. Localized edema without significant mass effect. No midline shift. Basilar cisterns remain grossly patent. No new acute intracranial process identified. Remote left MCA territory infarct again noted. Atrophy with chronic microvascular ischemic disease is stable. No extra-axial fluid collection. Scalp soft tissues demonstrate no acute abnormality. Globes and orbits grossly normal. Paranasal sinuses are largely clear.  No mastoid effusion. Calvarium unchanged. IMPRESSION: 1. Continued interval evolution of hemorrhagic right MCA territory infarct with similar localized mass effect. No midline shift, hydrocephalus, or evidence of ventricular trapping. Degree of hemorrhage is slightly less conspicuous as compared to prior study. 2. No new intracranial  process. Electronically Signed   By: Jeannine Boga M.D.   On: 01/16/2016 02:28   Ct Head Wo Contrast  01/15/2016  CLINICAL DATA:  Altered mental status. History of mitral valve surgery. EXAM: CT HEAD WITHOUT CONTRAST TECHNIQUE: Contiguous axial images were obtained from the base of the skull through the vertex without intravenous  contrast. COMPARISON:  Head CT 06/14/2015. FINDINGS: Brain: There is multifocal acute gyriform hemorrhage throughout the right frontal, temporal and parietal lobes. There is a component in the right basal ganglia measuring 2.6 x 1.1 cm on image 18. There is surrounding low density extending to the cortex. There is localized mass effect on the right lateral ventricle, but no midline shift or hydrocephalus. Chronic encephalomalacia in the left MCA distribution is stable. There are extensive vascular calcifications without evidence of dense MCA sign. Bones/sinuses/visualized face: The visualized paranasal sinuses, mastoid air cells and middle ears are clear. The calvarium is intact. IMPRESSION: 1. 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 No midline shift or hydrocephalus. 2. Underlying atrophy and chronic encephalomalacia in the left MCA distribution. 3. Critical Value/emergent results were called by telephone at the time of interpretation on 01/15/2016 at 3:40 pm to [facility] , who verbally acknowledged these results. Electronically Signed   By: Richardean Sale M.D.   On: 01/15/2016 15:40   Dg Chest Port 1 View  01/15/2016  CLINICAL DATA:  Acute mental status change EXAM: PORTABLE CHEST 1 VIEW COMPARISON:  June 03, 2009 FINDINGS: Stable cardiomegaly. Linear opacity in the right mid lung, improved since 2010, likely scarring or chronic atelectasis. Evaluation of the apices is limited due to the head slumped over the upper chest but no pneumothorax is identified. No pulmonary nodules or masses. No focal infiltrates otherwise seen. The hila and mediastinum are normal. A rounded density is projected over the proximal right humerus, not seen previously. IMPRESSION: 1. No acute abnormality seen within the chest. Chronic scar or atelectasis in the right mid lung. 2. The rounded density projected over the proximal right humerus may be artifactual. Dedicated images of the right  shoulder could further evaluate as clinically warranted. Electronically Signed   By: Dorise Bullion III M.D   On: 01/15/2016 16:16    PHYSICAL EXAM   ASSESSMENT/PLAN [name] is a 82 y.o. male with history of advanced dementia presenting with altered mental state. Found to have a large R MCA infarct with hemorrhagic tranformation. He did not receive IV t-PA due to delay in arrival.   Stroke:  Large R MCA infarct with hemorrhagic transformation. Infarct embolic secondary to cardiac vs thromboembolic from large vessel source  CT large R MCA infarct with localized mass effect. Underlying atrophy and chronic encephalomalacia. Hemorrhagic transformation  Repeat CT evolution R MCA infarct, no increase in mass effect  SCDs for VTE prophylaxis Diet NPO time specified  No antithrombotic prior to admission, now on No antithrombotic  Therapy recommendations:  pending   Disposition:  pending   Given baseline dementia and large stroke, neuro prognosis for meaningful recovery is poor. Palliative care recommended. Wife open to this option. Will discuss further on her arrival.  For now, leave in ICU. Likely transfer to floor later today.  Hypertension  BP Stable  Initially on cardene, now off  Hyperlipidemia  Home meds:  lipitor 20  LDL not ordered, goal < 70  Other Stroke Risk Factors  Advanced age  Other Active Problems  Baseline dementia - on depakote, seoquel, trazodone, xanax, celexa -  has RUE paratonia  arthritis  Hospital day # [place] for Pager information 01/16/2016 9:03 AM   ADDENDUM: 1000 - Dr. Leonie Man met with wife and daughter. They agree to full comfort care and return to nursing home for ongoing care. Will transfer to floor. Social worker consulted.  [place] for Pager information 01/16/2016 10:06 AM   I have personally examined this patient, reviewed notes,  independently viewed imaging studies, participated in medical decision making and plan of care. I have made any additions or clarifications directly to the above note. Agree with note above.    Antony Contras, MD Medical Director [facility] Pager: [phone] 01/17/2016 5:07 PM  To contact Stroke Continuity provider, please refer to [email or web address] After hours, contact General Neurology

## 2016-01-16 NOTE — Evaluation (Signed)
Clinical/Bedside Swallow Evaluation Patient Details  Name: Chad Delacruz MRN: [id number] Date of Birth: 1927-06-18  Today's Date: 01/16/2016 Time: SLP Start Time (ACUTE ONLY): [id number] SLP Stop Time (ACUTE ONLY): 0829 SLP Time Calculation (min) (ACUTE ONLY): 12 min  Past Medical History:  Past Medical History  Diagnosis Date  . HYPERLIPIDEMIA   . MITRAL REGURGITATION   . Unspecified essential hypertension   . MITRAL VALVE PROLAPSE   . ESOPHAGEAL STRICTURE   . PUD   . OSTEOARTHRITIS   . GASTROINTESTINAL HEMORRHAGE, HX OF   . BENIGN PROSTATIC HYPERTROPHY, HX OF   . Dementia   . HIATAL HERNIA    Past Surgical History:  Past Surgical History  Procedure Laterality Date  . Hemorrhoid surgery    . Circumcision    . Mitral valve repair  05/07/2009    Dr. Roxy Manns  . Hernia repair  03-31-12    BIH repair   . Hernia repair  03/2012   HPI:  Chad Delacruz is an31 yo male with advanced dementia and poor baseline presents with 3 day hx of altered mental status from his assisted living facility after he became even less verbal and not swallowing. CT acute large hemorrhagic right MCA distribution infarct with localized mass effect. CXR no acute abnormality.   Assessment / Plan / Recommendation Clinical Impression  Pt lethargic, kept eyes closed; no command following or vocalization despite total multimodal stimulation. Decreased saliva management with noticeable dried secretions on gown. Following oral care pt made no attempts to manipulate applesauce and suctioned from oral cavity. Continue NPO with oral care and will follow.       Aspiration Risk  Severe aspiration risk    Diet Recommendation NPO   Medication Administration: Via alternative means    Other  Recommendations Oral Care Recommendations: Oral care QID   Follow up Recommendations   (TBD)    Frequency and Duration min 2x/week  2 weeks       Prognosis Prognosis for Safe Diet Advancement:  (fair) Barriers to Reach Goals:  Cognitive deficits;Severity of deficits      Swallow Study   General HPI: Chad Delacruz is an39 yo male with advanced dementia and poor baseline presents with 3 day hx of altered mental status from his assisted living facility after he became even less verbal and not swallowing. CT acute large hemorrhagic right MCA distribution infarct with localized mass effect. CXR no acute abnormality. Type of Study: Bedside Swallow Evaluation Previous Swallow Assessment: none Diet Prior to this Study: NPO Temperature Spikes Noted: Yes Respiratory Status: Room air History of Recent Intubation: No Behavior/Cognition: Lethargic/Drowsy;Requires cueing Oral Cavity Assessment: Excessive secretions Oral Care Completed by SLP: Yes Oral Cavity - Dentition:  (could not view anterior or lateral dentition) Vision:  (eyes closed throughout) Self-Feeding Abilities: Total assist Patient Positioning: Upright in bed Baseline Vocal Quality:  (no vocalization) Volitional Cough: Cognitively unable to elicit Volitional Swallow: Unable to elicit    Oral/Motor/Sensory Function Overall Oral Motor/Sensory Function:  (decreased labial tone, no volitional movements)   Ice Chips Ice chips: Not tested   Thin Liquid Thin Liquid: Not tested    Nectar Thick Nectar Thick Liquid: Not tested   Honey Thick Honey Thick Liquid: Not tested   Puree Puree: Impaired Presentation: Spoon Oral Phase Impairments: Reduced labial seal;Poor awareness of bolus Oral Phase Functional Implications: Oral holding Pharyngeal Phase Impairments:  (no pharyngeal swallow)   Solid   GO   Solid: Not tested        Chad Delacruz,  Chad Delacruz 01/16/2016,8:46 AM   Chad Delacruz Colvin Caroli.Ed [employer] [phone]

## 2016-01-16 NOTE — Progress Notes (Signed)
Attempted to call report; nurse will call back.

## 2016-01-16 NOTE — NC FL2 (Signed)
[place] LEVEL OF CARE SCREENING TOOL     IDENTIFICATION  Patient Name: Chad Delacruz Birthdate: 30-Mar-1927 Sex: male Admission Date (Current Location): 01/15/2016  [facility] and [place] Number:  Herbalist and Address:  The . [facility], [place] [address] [place], [place] [address]      Provider Number: [id number]  Attending Physician Name and Address:  Garvin Fila, MD  Relative Name and Phone Number:       Current Level of Care: Hospital Recommended Level of Care: [place] Prior Approval Number:    Date Approved/Denied:   PASRR Number:   [id number]  Discharge Plan: SNF    Current Diagnoses: Patient Active Problem List   Diagnosis Date Noted  . ICH (intracerebral hemorrhage) ([place]) 01/15/2016  . Bilateral lower extremity edema 12/05/2015  . Anxiety associated with depression 12/05/2015  . 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 Chronic venous insufficiency 10/19/2015  . Need for home health care 07/29/2015  . Impaired mobility and ADLs 07/29/2015  . Edema, peripheral 10/12/2012  . Bilateral inguinal hernia (BIH), L>R 02/17/2012  . Abdominal tenderness, periumbilic [id number]  . Dementia 08/02/2011  . Routine general medical examination at a health care facility 01/31/2011  . MITRAL REGURGITATION 01/29/2009  . Osteoarthritis 01/29/2009  . ESOPHAGEAL STRICTURE 09/16/2007  . Hyperlipidemia 06/21/2007  . Essential hypertension 06/21/2007  . MITRAL VALVE PROLAPSE 06/21/2007  . BENIGN PROSTATIC HYPERTROPHY, HX OF 06/21/2007  . PUD 04/12/2007  . HIATAL HERNIA 04/12/2007    Orientation RESPIRATION BLADDER Height & Weight     Self  Normal Incontinent, External catheter Weight: 160 lb 0.9 oz (72.6 kg) Height:  5\' 9"  (175.3 cm)  BEHAVIORAL SYMPTOMS/MOOD NEUROLOGICAL BOWEL NUTRITION STATUS   (NONE )  (NONE ) Continent Diet (NPO)  AMBULATORY STATUS COMMUNICATION OF NEEDS Skin   Total Care Does not communicate  Normal                       Personal Care Assistance Level of Assistance  Bathing, Feeding, Dressing, Total care Bathing Assistance: Maximum assistance Feeding assistance: Maximum assistance Dressing Assistance: Maximum assistance Total Care Assistance: Maximum assistance   Functional Limitations Info  Sight, Hearing, Speech Sight Info: Adequate Hearing Info: Adequate Speech Info: Impaired    SPECIAL CARE FACTORS FREQUENCY                       Contractures      Additional Factors Info  Code Status, Allergies Code Status Info: DNR CODE  Allergies Info: Aspirin           Current Medications (01/16/2016):  This is the current hospital active medication list Current Facility-Administered Medications  Medication Dose Route Frequency Provider Last Rate Last Dose  . acetaminophen (TYLENOL) tablet 650 mg  650 mg Oral Q4H PRN Marliss Coots, PA-C       Or  . acetaminophen (TYLENOL) suppository 650 mg  650 mg Rectal Q4H PRN Marliss Coots, PA-C   650 mg at 01/16/16 0400     Discharge Medications: Please see discharge summary for a list of discharge medications.  Relevant Imaging Results:  Relevant Lab Results:   Additional Information SSN [id number] COMFORT CARE at [facility], MSW, Jessie [phone] 01/16/2016 2:00 PM      I have personally examined this patient, reviewed notes, independently viewed imaging studies, participated in medical decision making and plan of care. I have made any  additions or clarifications directly to the above note. Agree with note above.   Antony Contras, MD Medical Director [place] Pager: [phone] 01/17/2016 5:06 PM

## 2016-01-16 NOTE — Progress Notes (Signed)
Nutrition Brief Note  Chart reviewed. Pt now transitioning to comfort care.  No nutrition interventions warranted at this time.  Please consult as needed.   Penda Venturi RD, LDN, CNSC [phone] Pager [phone] After Hours Pager    

## 2016-01-16 NOTE — Discharge Summary (Signed)
Stroke Discharge Summary  Patient ID: Chad Delacruz   MRN: [id number]      DOB: 1926-10-01  Date of Admission: 01/15/2016 Date of Discharge: 01/16/2016  Attending Physician:  Garvin Fila, MD, Stroke MD Patient's PCP:  Gildardo Cranker, DO  DISCHARGE DIAGNOSIS:  Principal Problem:   Embolic cerebral infarction [facility]) Active Problems:   Hyperlipidemia   Essential hypertension   Dementia   Petechial hemorrhage   Past Medical History  Diagnosis Date  . HYPERLIPIDEMIA   . MITRAL REGURGITATION   . Unspecified essential hypertension   . MITRAL VALVE PROLAPSE   . ESOPHAGEAL STRICTURE   . PUD   . OSTEOARTHRITIS   . GASTROINTESTINAL HEMORRHAGE, HX OF   . BENIGN PROSTATIC HYPERTROPHY, HX OF   . Dementia   . HIATAL HERNIA    Past Surgical History  Procedure Laterality Date  . Hemorrhoid surgery    . Circumcision    . Mitral valve repair  05/07/2009    Dr. Roxy Manns  . Hernia repair  03-31-12    BIH repair   . Hernia repair  03/2012      Medication List    STOP taking these medications        acetaminophen 500 MG tablet  Commonly known as:  PAIN RELIEF  Replaced by:  acetaminophen 650 MG suppository     ALPRAZolam 0.25 MG tablet  Commonly known as:  XANAX     atorvastatin 20 MG tablet  Commonly known as:  LIPITOR     cholecalciferol 1000 units tablet  Commonly known as:  VITAMIN D     citalopram 20 MG tablet  Commonly known as:  CELEXA     divalproex 125 MG DR tablet  Commonly known as:  DEPAKOTE     furosemide 40 MG tablet  Commonly known as:  LASIX     guaifenesin 100 MG/5ML syrup  Commonly known as:  ROBITUSSIN     lisinopril 20 MG tablet  Commonly known as:  PRINIVIL,ZESTRIL     metoprolol tartrate 25 MG tablet  Commonly known as:  LOPRESSOR     QUEtiapine 50 MG tablet  Commonly known as:  SEROQUEL     REFRESH OP     traZODone 50 MG tablet  Commonly known as:  DESYREL      TAKE these medications        acetaminophen 650 MG suppository   Commonly known as:  TYLENOL  Place 1 suppository (650 mg total) rectally every 4 (four) hours as needed for mild pain (or temp > 99 F).        LABORATORY STUDIES CBC    Component Value Date/Time   WBC 9.5 01/15/2016 1600   WBC 5.4 10/29/2015   RBC 4.06* 01/15/2016 1600   HGB 12.6* 01/15/2016 1600   HCT 40.8 01/15/2016 1600   PLT 234 01/15/2016 1600   MCV 100.5* 01/15/2016 1600   MCH 31.0 01/15/2016 1600   MCHC 30.9 01/15/2016 1600   RDW 13.1 01/15/2016 1600   LYMPHSABS 1.3 01/15/2016 1600   MONOABS 0.8 01/15/2016 1600   EOSABS 0.0 01/15/2016 1600   BASOSABS 0.0 01/15/2016 1600   CMP    Component Value Date/Time   NA 149* 01/15/2016 1600   K 3.8 01/15/2016 1600   CL 113* 01/15/2016 1600   CO2 23 01/15/2016 1600   GLUCOSE 91 01/15/2016 1600   BUN 29* 01/15/2016 1600   CREATININE 1.66* 01/15/2016 1600   CALCIUM 9.6 01/15/2016 1600  PROT 7.0 01/15/2016 1600   ALBUMIN 3.5 01/15/2016 1600   AST 43* 01/15/2016 1600   ALT 21 01/15/2016 1600   ALKPHOS 57 01/15/2016 1600   BILITOT 1.8* 01/15/2016 1600   GFRNONAA 36* 01/15/2016 1600   GFRAA 41* 01/15/2016 1600   COAGS Lab Results  Component Value Date   INR 1.39 05/07/2009   INR 1.01 05/03/2009   INR 1.1 ratio* 04/08/2009   Lipid Panel    Component Value Date/Time   CHOL 126 10/12/2012 1716   TRIG 59.0 10/12/2012 1716   HDL 54.40 10/12/2012 1716   CHOLHDL 2 10/12/2012 1716   VLDL 11.8 10/12/2012 1716   LDLCALC 60 10/12/2012 1716   HgbA1C  Lab Results  Component Value Date   HGBA1C  05/03/2009    5.4 (NOTE) The ADA recommends the following therapeutic goal for glycemic control related to Hgb A1c measurement: Goal of therapy: <6.5 Hgb A1c  Reference: American Diabetes Association: Clinical Practice Recommendations 2010, Diabetes Care, 2010, 33: (Suppl  1).   Cardiac Panel (last 3 results) No results for input(s): CKTOTAL, CKMB, TROPONINI, RELINDX in the last 72 hours. Urinalysis    Component Value  Date/Time   COLORURINE YELLOW 07/17/2015 0034   APPEARANCEUR CLEAR 07/17/2015 0034   LABSPEC 1.019 07/17/2015 0034   PHURINE 5.5 07/17/2015 0034   GLUCOSEU NEGATIVE 07/17/2015 0034   GLUCOSEU NEGATIVE 01/20/2012 1050   HGBUR NEGATIVE 07/17/2015 0034   BILIRUBINUR NEGATIVE 07/17/2015 0034   KETONESUR NEGATIVE 07/17/2015 0034   PROTEINUR NEGATIVE 07/17/2015 0034   UROBILINOGEN 0.2 01/20/2012 1050   NITRITE NEGATIVE 07/17/2015 0034   LEUKOCYTESUR NEGATIVE 07/17/2015 0034   Urine Drug Screen No results found for: LABOPIA, COCAINSCRNUR, LABBENZ, AMPHETMU, THCU, LABBARB  Alcohol Level No results found for: [facility]   SIGNIFICANT DIAGNOSTIC STUDIES Ct Head Wo Contrast 01/16/2016  1. Continued interval evolution of hemorrhagic right MCA territory infarct with similar localized mass effect. No midline shift, hydrocephalus, or evidence of ventricular trapping. Degree of hemorrhage is slightly less conspicuous as compared to prior study. 2. No new intracranial process.  01/15/2016  1. 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 No midline shift or hydrocephalus. 2. Underlying atrophy and chronic encephalomalacia in the left MCA distribution.    Dg Chest Port 1 View 01/15/2016   1. No acute abnormality seen within the chest. Chronic scar or atelectasis in the right mid lung. 2. The rounded density projected over the proximal right humerus may be artifactual. Dedicated images of the right shoulder could further evaluate as clinically warranted.       HISTORY OF PRESENT ILLNESS Chad Delacruz is an 82 yo male with advanced dementia and poor baseline presents with 3 day hx of altered mental status from his assisted living facility after he became even less verbal and not swallowing. CTH found to have a large R MCA infract with hemorraghic transformation, no midline shift. Patient was not administered IV t-PA secondary to delay in arrival. He was admitted to the neuro ICU  for further evaluation and treatment.    HOSPITAL COURSE [name] is a 82 y.o. male with history of advanced dementia presenting with altered mental state. Found to have a large R MCA infarct with hemorrhagic tranformation. He did not receive IV t-PA due to delay in arrival.   Stroke: Large R MCA infarct with hemorrhagic transformation. Infarct embolic secondary to cardiac vs thromboembolic from large vessel source  CT large R MCA infarct with localized mass effect.  Underlying atrophy and chronic encephalomalacia. Hemorrhagic transformation  Repeat CT evolution R MCA infarct, no increase in mass effect  No antithrombotic prior to admission, now on No antithrombotic.   As patient with poor prognosis and family leaning towards comfort care, stroke workup was not completed  Given baseline dementia and large stroke, neuro prognosis for meaningful recovery is poor.   Wife and daughter opted for palliative care. They would like patient to return to SNF for ongoing care.  Disposition: return to SNF as PTA for comfort care  Hypertension  BP Stable  Initially on cardene  BP remained/s within acceptable level  Hyperlipidemia  Home meds: lipitor 20  Other Stroke Risk Factors  Advanced age  Other Active Problems  Baseline dementia - on depakote, seoquel, trazodone, xanax, celexa - has RUE paratonia  arthritis  DISCHARGE EXAM Blood pressure 149/119, pulse 101, temperature 98.9 F (37.2 C), temperature source Oral, resp. rate 22, height 5\' 9"  (1.753 m), weight 72.6 kg (160 lb 0.9 oz), SpO2 92 %. HEENT- Normocephalic, no lesions, without obvious abnormality. Normal external eye and conjunctiva. Normal external nose, mucus membranes and septum. Normal pharynx. Cardiovascular- regular rate and rhythm, S1, S2 normal, no murmur, click, rub or gallop, pulses palpable throughout  Neurological Examination Mental Status: Awake, non verbal Does not follow  commands Grimaces to pain Pupils are equal and reactive L hemiparesis, spastic R side is 4/5, withdraws to pain   Discharge Diet   Diet NPO time specified  DISCHARGE PLAN  Disposition:  Return to SNF for comfort care   Follow-up MD at SNF for any needs  Neuro follow-up not indicated  35 minutes were spent preparing discharge.  [place] for Pager information 01/16/2016 2:50 PM   I have personally examined this patient, reviewed notes, independently viewed imaging studies, participated in medical decision making and plan of care. I have made any additions or clarifications directly to the above note. Agree with note above. I had a long discussion with the patient's wife and multiple family members at the bedside regarding his poor prognosis given baseline. Moderate dementia with mild large hemorrhagic right MCA infarct. The wife and daughter understood her poor prognosis and agree with comfort care only and DO NOT RESUSCITATE. Patient will be transferred back to nursing facility with comfort care measures.  Antony Contras, MD Medical Director [facility] Pager: [phone] 01/16/2016 3:03 PM

## 2016-01-16 NOTE — Progress Notes (Signed)
CRITICAL VALUE ALERT  Critical value received: CBG=61 Date of notification:  01/16/16 Time of notification:  0400 Critical value read back:Yes.    Nurse who received alert: Elta Guadeloupe MD notified: Dr Patricia Nettle) Orders/Plan:Continue current monitoring post 1/2 amp of D50. Blood sugar rechecked post D50 and is now CBG=105.

## 2016-01-16 NOTE — Clinical Social Work Note (Signed)
Patient transferred to [facility] and was stable for d/c. CSW informed that patient was admitted from [place] and will return today as comfort care. CSW confirmed plans with facility. Facility prepared to re-admit patient today, 6/29 with comfort care orders.   Clinical Social Worker facilitated patient discharge including contacting patient family and facility to confirm patient discharge plans.  Clinical information faxed to facility and family agreeable with plan.  CSW arranged ambulance transport via [facility] to [facility].  RN to call report prior to discharge.  Clinical Social Worker will sign off for now as social work intervention is no longer needed. Please consult [place] again if new need arises.  Glendon Axe, MSW, LCSWA [phone] 01/16/2016 3:06 PM

## 2016-01-16 NOTE — Progress Notes (Signed)
OT Cancellation Note  Patient Details Name: Chad Delacruz MRN: [id number] DOB: 09-18-1926   Cancelled Treatment:    Reason Eval/Treat Not Completed: Patient not medically ready. Pt with active bedrest orders. Please update activity orders to initiate therapy. Thanks  [place], OTR/L  [phone] 01/16/2016 01/16/2016, 6:18 AM

## 2016-01-16 NOTE — Progress Notes (Signed)
Report given to 6N nurse.

## 2016-01-17 ENCOUNTER — Encounter: Payer: Self-pay | Admitting: Nurse Practitioner

## 2016-01-17 ENCOUNTER — Non-Acute Institutional Stay (SKILLED_NURSING_FACILITY): Payer: Medicare Other | Admitting: Nurse Practitioner

## 2016-01-17 ENCOUNTER — Telehealth: Payer: Self-pay | Admitting: Internal Medicine

## 2016-01-17 DIAGNOSIS — I10 Essential (primary) hypertension: Secondary | ICD-10-CM

## 2016-01-17 DIAGNOSIS — F0391 Unspecified dementia with behavioral disturbance: Secondary | ICD-10-CM | POA: Diagnosis not present

## 2016-01-17 DIAGNOSIS — I63131 Cerebral infarction due to embolism of right carotid artery: Secondary | ICD-10-CM

## 2016-01-17 NOTE — Telephone Encounter (Signed)
Possible re-admission to facility  - This is a patient you were seeing at **[facility] fu is needed IF patient was re-admitted to facility upon discharge. Hospital discharge **01/16/16**

## 2016-01-17 NOTE — Progress Notes (Signed)
Nursing Home Location:  [facility]   Place of Service: SNF (31)  PCP: Gildardo Cranker, DO  Allergies  Allergen Reactions  . Aspirin Other (See Comments)    REACTION: BLEEDING    Chief Complaint  Patient presents with  . Hospitalization Follow-up    [facility] stay 01/15/16 until 01/16/16    HPI:  Patient is a 82 y.o. male seen today at [facility] for hospitalization follow up. Pt with a hx of dementia with behaviors, hyperlipidemia, MR, OA.  Pt was sent to the ED due to AMS and was found to have large R MCA infract with hemorraghic transformation, no midline shift. Patient was not administered IV t-PA secondary to delay in arrival. He was admitted to the neuro ICU. Pt with advanced dementia at baseline. Family opted for palliative care during hospitalization and was transferred back to [facility]. Current goals of care is comfort.  Review of Systems:  Review of Systems  Unable to perform ROS: Patient unresponsive    Past Medical History  Diagnosis Date  . HYPERLIPIDEMIA   . MITRAL REGURGITATION   . Unspecified essential hypertension   . MITRAL VALVE PROLAPSE   . ESOPHAGEAL STRICTURE   . PUD   . OSTEOARTHRITIS   . GASTROINTESTINAL HEMORRHAGE, HX OF   . BENIGN PROSTATIC HYPERTROPHY, HX OF   . Dementia   . HIATAL HERNIA    Past Surgical History  Procedure Laterality Date  . Hemorrhoid surgery    . Circumcision    . Mitral valve repair  05/07/2009    Dr. Roxy Manns  . Hernia repair  03-31-12    BIH repair   . Hernia repair  03/2012   Social History:   reports that he has never smoked. He has never used smokeless tobacco. He reports that he does not drink alcohol or use illicit drugs.  Family History  Problem Relation Age of Onset  . Hypertension Mother   . Hypertension Father     Medications: Patient's Medications  New Prescriptions   No medications on file  Previous Medications   ACETAMINOPHEN (TYLENOL) 650 MG SUPPOSITORY    Place 1 suppository (650 mg  total) rectally every 4 (four) hours as needed for mild pain (or temp > 99 F).  Modified Medications   No medications on file  Discontinued Medications   No medications on file     Physical Exam: Filed Vitals:   01/17/16 1043  BP: 137/93  Pulse: 89  Temp: 97.9 F (36.6 C)  TempSrc: Oral  Resp: 18  Height: 5\' 9"  (1.753 m)  Weight: 160 lb (72.576 kg)  SpO2: 99%    Physical Exam  Constitutional: He appears well-developed.  Frail elderly male  Eyes:  Does not open right eye  Neck: Neck supple. Carotid bruit is not present.  Cardiovascular: Normal rate.  An irregular rhythm present.  Murmur heard.  Systolic murmur is present with a grade of 1/6  no distal LE swelling. No calf TTP  Pulmonary/Chest: Effort normal.  Diminished throughout   Abdominal: Soft. Bowel sounds are normal. He exhibits no distension, no abdominal bruit and no pulsatile midline mass. There is no tenderness.  Neurological: He is unresponsive.  Skin: Skin is warm and dry.  Psychiatric: He has a normal mood and affect. His behavior is normal.    Labs reviewed: Basic Metabolic Panel:  Recent Labs  07/16/15 2323 01/15/16 1600  NA 141 149*  K 3.7 3.8  CL 105 113*  CO2 27 23  GLUCOSE 95 91  BUN 25* 29*  CREATININE 1.49* 1.66*  CALCIUM 8.8* 9.6   Liver Function Tests:  Recent Labs  07/16/15 2323 10/29/15 01/15/16 1600  AST 15 15 43*  ALT 9* 12 21  ALKPHOS 68 51 57  BILITOT 0.5  --  1.8*  PROT 6.1*  --  7.0  ALBUMIN 3.7  --  3.5   No results for input(s): LIPASE, AMYLASE in the last 8760 hours. No results for input(s): AMMONIA in the last 8760 hours. CBC:  Recent Labs  07/16/15 2323 10/29/15 01/15/16 1600  WBC 4.0 5.4 9.5  NEUTROABS 2.5  --  7.3  HGB 12.3* 13.3* 12.6*  HCT 38.3* 41 40.8  MCV 95.5  --  100.5*  PLT 147* 173 234   TSH: No results for input(s): TSH in the last 8760 hours. A1C: Lab Results  Component Value Date   HGBA1C  05/03/2009    5.4 (NOTE) The ADA  recommends the following therapeutic goal for glycemic control related to Hgb A1c measurement: Goal of therapy: <6.5 Hgb A1c  Reference: American Diabetes Association: Clinical Practice Recommendations 2010, Diabetes Care, 2010, 33: (Suppl  1).   Lipid Panel: No results for input(s): CHOL, HDL, LDLCALC, TRIG, CHOLHDL, LDLDIRECT in the last 8760 hours.   Valproic acid (Depakene) 01/08/16: 49.7 ug/ml    Assessment/Plan 1. 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 Underlying atrophy and chronic encephalomalacia. Hemorrhagic transformation Patient with poor prognosis and family would like towards comfort care, stroke workup was not completed. Pt transferred back to [place]. Hospice consult ordered Off all PO medication at this time. No signs of pain noted  2. Essential hypertension Stable, off all medication.   3. Dementia, with behavioral disturbance Advanced dementia at baseline. Pt without behaviors at this time. Comfort measures only.    Carlos American. Harle Battiest  [facility] [phone] 8 am - 5 pm) [phone] (after hours)

## 2016-01-20 ENCOUNTER — Non-Acute Institutional Stay (SKILLED_NURSING_FACILITY): Payer: Medicare Other | Admitting: Internal Medicine

## 2016-01-20 ENCOUNTER — Encounter: Payer: Self-pay | Admitting: Internal Medicine

## 2016-01-20 DIAGNOSIS — M150 Primary generalized (osteo)arthritis: Secondary | ICD-10-CM

## 2016-01-20 DIAGNOSIS — N183 Chronic kidney disease, stage 3 (moderate): Secondary | ICD-10-CM | POA: Diagnosis not present

## 2016-01-20 DIAGNOSIS — I63411 Cerebral infarction due to embolism of right middle cerebral artery: Secondary | ICD-10-CM

## 2016-01-20 DIAGNOSIS — M159 Polyosteoarthritis, unspecified: Secondary | ICD-10-CM

## 2016-01-20 DIAGNOSIS — Z952 Presence of prosthetic heart valve: Secondary | ICD-10-CM

## 2016-01-20 DIAGNOSIS — F0391 Unspecified dementia with behavioral disturbance: Secondary | ICD-10-CM | POA: Diagnosis not present

## 2016-01-20 DIAGNOSIS — Z954 Presence of other heart-valve replacement: Secondary | ICD-10-CM

## 2016-01-20 DIAGNOSIS — R627 Adult failure to thrive: Secondary | ICD-10-CM | POA: Diagnosis not present

## 2016-01-20 DIAGNOSIS — E785 Hyperlipidemia, unspecified: Secondary | ICD-10-CM | POA: Diagnosis not present

## 2016-01-20 DIAGNOSIS — F29 Unspecified psychosis not due to a substance or known physiological condition: Secondary | ICD-10-CM

## 2016-01-20 NOTE — Progress Notes (Signed)
DATE: 01/20/16  Location:  Nursing Home Location: [facility]  Nursing Home Room Number: [id number] Place of Service: SNF (31)   Extended Emergency Contact Information Primary Emergency Contact: Foronda,Louanna Address: [address]          [place], [place] [address] [place] Phone: [phone] Relation: Spouse Secondary Emergency Contact: Gloster,[name]  [place] Phone: [phone] Relation: Daughter  Advanced Directive information Does patient have an advance directive?: Yes, Type of Advance Directive: Healthcare Power of Mina;Living will;Out of facility DNR (pink MOST or yellow form), Does patient want to make changes to advanced directive?: No - Patient declined [facility] PT Chief Complaint  Patient presents with  . Readmit To SNF    HPI:  82 yo male seen today as a readmission into SNF following hospital stay for  Acute embolic large right MCA infarction, petechial hemorrhage, dementia, HTN, hyperlipidemia. CT head revealed localized mass effect. Repeat CT head showed evolution of right MCA infarct but no change in mass effect. Neuro prognosis determined to be poor due to baseline dementia and large stroke. Family opted for palliative care and comfort care. He was sent back to SNF for comfort care. All meds except tylenol supp stopped.  He is nonverbal today, lethargic. Wife and family friend at bedside. No nursing issues. Poor appetite. Sleeps often. Hospice is following. He is a poor historian due to nonverbal state. Hx obtained from chart.  He is currently only taking tylenol and roxanol.   Past Medical History  Diagnosis Date  . HYPERLIPIDEMIA   . MITRAL REGURGITATION   . Unspecified essential hypertension   . MITRAL VALVE PROLAPSE   . ESOPHAGEAL STRICTURE   . PUD   . OSTEOARTHRITIS   . GASTROINTESTINAL HEMORRHAGE, HX OF   . BENIGN PROSTATIC HYPERTROPHY, HX OF   . Dementia   . HIATAL HERNIA     Past Surgical  History  Procedure Laterality Date  . Hemorrhoid surgery    . Circumcision    . Mitral valve repair  05/07/2009    Dr. Roxy Manns  . Hernia repair  03-31-12    BIH repair   . Hernia repair  03/2012    Patient Care Team: Gildardo Cranker, DO as PCP - General (Internal Medicine)  Social History   Social History  . Marital Status: Married    Spouse Name: N/A  . Number of Children: 4  . Years of Education: 12   Occupational History  . Retired    Social History Main Topics  . Smoking status: Never Smoker   . Smokeless tobacco: Never Used  . Alcohol Use: No  . Drug Use: No  . Sexual Activity: Not Currently   Other Topics Concern  . Not on file   Social History Narrative   11th grade. Married 1950 - life sentence. 3 dtrs - '49, '50, '52; 2 sons - '51, '75. He had children by three women. 4 grandchildren. 1 great-grand.   He previously worked for [employer] 25 years, retired from that. Worked as a Development worker, community but totally retired in 2011. End of Life care - wishes full resuscitation and intensive treatment to include mechanical ventilation.      Currently living at home     reports that he has never smoked. He has never used smokeless tobacco. He reports that he does not drink alcohol or use illicit drugs.  Family History  Problem Relation Age of Onset  . Hypertension Mother   .  Hypertension Father    Family Status  Relation Status Death Age  . Mother Deceased 53  . Father Deceased 55  . Maternal Grandmother Deceased   . Maternal Grandfather Deceased   . Paternal Grandmother Deceased   . Paternal Grandfather Deceased     Immunization History  Administered Date(s) Administered  . Influenza Split 04/21/2012  . Influenza-Unspecified 05/20/2013  . PPD Test 08/22/2012, 09/06/2015  . Tdap 07/29/2012    Allergies  Allergen Reactions  . Aspirin Other (See Comments)    REACTION: BLEEDING    Medications: Patient's Medications  New Prescriptions   No medications on  file  Previous Medications   ACETAMINOPHEN (TYLENOL) 650 MG SUPPOSITORY    Place 1 suppository (650 mg total) rectally every 4 (four) hours as needed for mild pain (or temp > 99 F).  Modified Medications   No medications on file  Discontinued Medications   No medications on file    Review of Systems  Unable to perform ROS: Dementia    Filed Vitals:   01/20/16 1240  BP: 141/77  Pulse: 70  Temp: 98 F (36.7 C)  TempSrc: Oral  Resp: 18  Height: '5\' 9"'$  (1.753 m)  Weight: 174 lb 9.6 oz (79.198 kg)   Body mass index is 25.77 kg/(m^2).  Physical Exam  Constitutional: He appears well-developed. He appears lethargic.  Frail appearing in NAD, lying in bed, nonverbal  HENT:  Refused to open mouth  Eyes: Pupils are equal, round, and reactive to light. No scleral icterus.  Neck: Neck supple. Carotid bruit is not present. No thyromegaly present.  Cardiovascular: Normal rate, regular rhythm, normal heart sounds and intact distal pulses.  Exam reveals no gallop and no friction rub.   No murmur heard. no distal LE swelling. No calf TTP  Pulmonary/Chest: Effort normal and breath sounds normal. He has no wheezes. He has no rales. He exhibits no tenderness.  Abdominal: Soft. Bowel sounds are normal. He exhibits no distension, no abdominal bruit, no pulsatile midline mass and no mass. There is no hepatomegaly. There is no tenderness. There is no rebound and no guarding.  Musculoskeletal: He exhibits edema.  Lymphadenopathy:    He has no cervical adenopathy.  Neurological: He appears lethargic. He displays tremor (resting). He exhibits abnormal muscle tone.  Facial droop  Skin: Skin is warm and dry. No rash noted.  Psychiatric: He has a normal mood and affect. His behavior is normal. He is noncommunicative.     Labs reviewed: Admission on 01/15/2016, Discharged on 01/16/2016  Component Date Value Ref Range Status  . Sodium 01/15/2016 149* 135 - 145 mmol/L Final  . Potassium 01/15/2016  3.8  3.5 - 5.1 mmol/L Final  . Chloride 01/15/2016 113* 101 - 111 mmol/L Final  . CO2 01/15/2016 23  22 - 32 mmol/L Final  . Glucose, Bld 01/15/2016 91  65 - 99 mg/dL Final  . BUN 01/15/2016 29* 6 - 20 mg/dL Final  . Creatinine, Ser 01/15/2016 1.66* 0.61 - 1.24 mg/dL Final  . Calcium 01/15/2016 9.6  8.9 - 10.3 mg/dL Final  . Total Protein 01/15/2016 7.0  6.5 - 8.1 g/dL Final  . Albumin 01/15/2016 3.5  3.5 - 5.0 g/dL Final  . AST 01/15/2016 43* 15 - 41 U/L Final  . ALT 01/15/2016 21  17 - 63 U/L Final  . Alkaline Phosphatase 01/15/2016 57  38 - 126 U/L Final  . Total Bilirubin 01/15/2016 1.8* 0.3 - 1.2 mg/dL Final  . GFR calc non Af  Amer 01/15/2016 36* >60 mL/min Final  . GFR calc Af Amer 01/15/2016 41* >60 mL/min Final   Comment: (NOTE) The eGFR has been calculated using the CKD EPI equation. This calculation has not been validated in all clinical situations. eGFR's persistently <60 mL/min signify possible Chronic Kidney Disease.   . Anion gap 01/15/2016 13  5 - 15 Final  . WBC 01/15/2016 9.5  4.0 - 10.5 K/uL Final  . RBC 01/15/2016 4.06* 4.22 - 5.81 MIL/uL Final  . Hemoglobin 01/15/2016 12.6* 13.0 - 17.0 g/dL Final  . HCT 01/15/2016 40.8  39.0 - 52.0 % Final  . MCV 01/15/2016 100.5* 78.0 - 100.0 fL Final  . MCH 01/15/2016 31.0  26.0 - 34.0 pg Final  . MCHC 01/15/2016 30.9  30.0 - 36.0 g/dL Final  . RDW 01/15/2016 13.1  11.5 - 15.5 % Final  . Platelets 01/15/2016 234  150 - 400 K/uL Final  . Neutrophils Relative % 01/15/2016 77   Final  . Neutro Abs 01/15/2016 7.3  1.7 - 7.7 K/uL Final  . Lymphocytes Relative 01/15/2016 14   Final  . Lymphs Abs 01/15/2016 1.3  0.7 - 4.0 K/uL Final  . Monocytes Relative 01/15/2016 9   Final  . Monocytes Absolute 01/15/2016 0.8  0.1 - 1.0 K/uL Final  . Eosinophils Relative 01/15/2016 0   Final  . Eosinophils Absolute 01/15/2016 0.0  0.0 - 0.7 K/uL Final  . Basophils Relative 01/15/2016 0   Final  . Basophils Absolute 01/15/2016 0.0  0.0 - 0.1  K/uL Final  . Lactic Acid, Venous 01/15/2016 2.57* 0.5 - 1.9 mmol/L Final  . Comment 01/15/2016 NOTIFIED PHYSICIAN   Final  . Valproic Acid Lvl 01/15/2016 <10* 50.0 - 100.0 ug/mL Final   RESULTS CONFIRMED BY MANUAL DILUTION  . Troponin i, poc 01/15/2016 0.03  0.00 - 0.08 ng/mL Final  . Comment 3 01/15/2016          Final   Comment: Due to the release kinetics of cTnI, a negative result within the first hours of the onset of symptoms does not rule out myocardial infarction with certainty. If myocardial infarction is still suspected, repeat the test at appropriate intervals.   Marland Kitchen MRSA by PCR 01/15/2016 NEGATIVE  NEGATIVE Final   Comment:        The GeneXpert MRSA Assay (FDA approved for NASAL specimens only), is one component of a comprehensive MRSA colonization surveillance program. It is not intended to diagnose MRSA infection nor to guide or monitor treatment for MRSA infections.   . Glucose-Capillary 01/15/2016 73  65 - 99 mg/dL Final  . Glucose-Capillary 01/16/2016 61* 65 - 99 mg/dL Final  . Glucose-Capillary 01/16/2016 105* 65 - 99 mg/dL Final  . Glucose-Capillary 01/16/2016 94  65 - 99 mg/dL Final  Nursing Home on 11/11/2015  Component Date Value Ref Range Status  . Hemoglobin 10/29/2015 13.3* 13.5 - 17.5 g/dL Final  . HCT 10/29/2015 41  41 - 53 % Final  . Platelets 10/29/2015 173  150 - 399 K/L Final  . WBC 10/29/2015 5.4   Final  . Alkaline Phosphatase 10/29/2015 51  25 - 125 U/L Final  . ALT 10/29/2015 12  10 - 40 U/L Final  . AST 10/29/2015 15  14 - 40 U/L Final  . Bilirubin, Direct 10/29/2015 0.3  0.01 - 0.4 mg/dL Final  . Bilirubin, Total 10/29/2015 1.4   Final    Ct Head Wo Contrast  01/16/2016  CLINICAL DATA:  Followup intracranial hemorrhage. EXAM:  CT HEAD WITHOUT CONTRAST TECHNIQUE: Contiguous axial images were obtained from the base of the skull through the vertex without intravenous contrast. COMPARISON:  Prior CT from 01/15/2016. FINDINGS: Study is  limited by patient positioning. Acute right MCA territory infarct with associated hemorrhage again seen. Overall, degree of hemorrhage is slightly less conspicuous as compared to prior exam. Involvement of the right basal ganglia again noted. No new hemorrhage. Localized edema without significant mass effect. No midline shift. Basilar cisterns remain grossly patent. No new acute intracranial process identified. Remote left MCA territory infarct again noted. Atrophy with chronic microvascular ischemic disease is stable. No extra-axial fluid collection. Scalp soft tissues demonstrate no acute abnormality. Globes and orbits grossly normal. Paranasal sinuses are largely clear.  No mastoid effusion. Calvarium unchanged. IMPRESSION: 1. Continued interval evolution of hemorrhagic right MCA territory infarct with similar localized mass effect. No midline shift, hydrocephalus, or evidence of ventricular trapping. Degree of hemorrhage is slightly less conspicuous as compared to prior study. 2. No new intracranial process. Electronically Signed   By: Jeannine Boga M.D.   On: 01/16/2016 02:28   Ct Head Wo Contrast  01/15/2016  CLINICAL DATA:  Altered mental status. History of mitral valve surgery. EXAM: CT HEAD WITHOUT CONTRAST TECHNIQUE: Contiguous axial images were obtained from the base of the skull through the vertex without intravenous contrast. COMPARISON:  Head CT 06/14/2015. FINDINGS: Brain: There is multifocal acute gyriform hemorrhage throughout the right frontal, temporal and parietal lobes. There is a component in the right basal ganglia measuring 2.6 x 1.1 cm on image 18. There is surrounding low density extending to the cortex. There is localized mass effect on the right lateral ventricle, but no midline shift or hydrocephalus. Chronic encephalomalacia in the left MCA distribution is stable. There are extensive vascular calcifications without evidence of dense MCA sign. Bones/sinuses/visualized face: The  visualized paranasal sinuses, mastoid air cells and middle ears are clear. The calvarium is intact. IMPRESSION: 1. 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 No midline shift or hydrocephalus. 2. Underlying atrophy and chronic encephalomalacia in the left MCA distribution. 3. Critical Value/emergent results were called by telephone at the time of interpretation on 01/15/2016 at 3:40 pm to [facility] , who verbally acknowledged these results. Electronically Signed   By: Richardean Sale M.D.   On: 01/15/2016 15:40   Dg Chest Port 1 View  01/15/2016  CLINICAL DATA:  Acute mental status change EXAM: PORTABLE CHEST 1 VIEW COMPARISON:  June 03, 2009 FINDINGS: Stable cardiomegaly. Linear opacity in the right mid lung, improved since 2010, likely scarring or chronic atelectasis. Evaluation of the apices is limited due to the head slumped over the upper chest but no pneumothorax is identified. No pulmonary nodules or masses. No focal infiltrates otherwise seen. The hila and mediastinum are normal. A rounded density is projected over the proximal right humerus, not seen previously. IMPRESSION: 1. No acute abnormality seen within the chest. Chronic scar or atelectasis in the right mid lung. 2. The rounded density projected over the proximal right humerus may be artifactual. Dedicated images of the right shoulder could further evaluate as clinically warranted. Electronically Signed   By: Dorise Bullion III M.D   On: 01/15/2016 16:16     Assessment/Plan   ICD-9-CM ICD-10-CM   1. Cerebral infarction due to embolism of right middle cerebral artery (HCC) 434.11 I63.411   2. Failure to thrive in adult 783.7 R62.7   3. Dementia, with behavioral disturbance 294.21 F03.91  4. S/P mitral valve replacement V43.3 Z95.4   5. 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 Hyperlipidemia 272.4 E78.5   7. CKD (chronic kidney disease), stage 3 (moderate) 585.3 N18.3   8. Primary osteoarthritis  involving multiple joints 715.09 M15.0      Cont current meds as ordered  Hospice to follow  Focus is comfort care  Will follow  Calvina Liptak S. Perlie Gold  [facility] [address] [place], [place] [address] [phone] Cell (Monday-Friday 8 AM - 5 PM) [phone] After 5 PM and follow prompts

## 2016-01-22 ENCOUNTER — Other Ambulatory Visit: Payer: Self-pay

## 2016-01-22 MED ORDER — MORPHINE SULFATE (CONCENTRATE) 20 MG/ML PO SOLN: 5.0000 mg | ORAL | Status: AC | PRN

## 2016-01-23 ENCOUNTER — Other Ambulatory Visit: Payer: Self-pay | Admitting: *Deleted

## 2016-01-23 MED ORDER — MORPHINE SULFATE 20 MG/5ML PO SOLN: 5.0000 mg | ORAL | Status: AC | PRN

## 2016-02-18 DEATH — deceased
